# Patient Record
Sex: Male | Born: 1950 | Race: White | Hispanic: No | Smoking: Never smoker
Health system: Southern US, Community
[De-identification: ages and names within clinical notes are randomized; demographics above are authoritative.]

## PROBLEM LIST (undated history)

## (undated) DIAGNOSIS — I10 Essential (primary) hypertension: Secondary | ICD-10-CM

## (undated) DIAGNOSIS — T8579XA Infection and inflammatory reaction due to other internal prosthetic devices, implants and grafts, initial encounter: Secondary | ICD-10-CM

## (undated) DIAGNOSIS — E785 Hyperlipidemia, unspecified: Secondary | ICD-10-CM

## (undated) DIAGNOSIS — Z789 Other specified health status: Secondary | ICD-10-CM

## (undated) DIAGNOSIS — R251 Tremor, unspecified: Secondary | ICD-10-CM

## (undated) DIAGNOSIS — F319 Bipolar disorder, unspecified: Secondary | ICD-10-CM

## (undated) DIAGNOSIS — M1990 Unspecified osteoarthritis, unspecified site: Secondary | ICD-10-CM

## (undated) HISTORY — PX: GROIN MASS OPEN BIOPSY: SHX1714

## (undated) HISTORY — PX: HERNIA REPAIR: SHX51

## (undated) HISTORY — PX: ROTATOR CUFF REPAIR: SHX139

## (undated) HISTORY — PX: JOINT REPLACEMENT: SHX530

## (undated) HISTORY — PX: BACK SURGERY: SHX140

## (undated) HISTORY — PX: HEMORROIDECTOMY: SUR656

---

## 1984-02-03 HISTORY — PX: PILONIDAL CYST EXCISION: SHX744

## 1999-03-31 ENCOUNTER — Encounter: Admission: RE | Admit: 1999-03-31 | Discharge: 1999-03-31 | Payer: Self-pay | Admitting: Orthopedic Surgery

## 1999-03-31 ENCOUNTER — Encounter: Payer: Self-pay | Admitting: Orthopedic Surgery

## 2000-07-02 ENCOUNTER — Encounter: Admission: RE | Admit: 2000-07-02 | Discharge: 2000-07-02 | Payer: Self-pay

## 2000-08-03 ENCOUNTER — Ambulatory Visit (HOSPITAL_COMMUNITY): Admission: RE | Admit: 2000-08-03 | Discharge: 2000-08-03 | Payer: Self-pay | Admitting: *Deleted

## 2000-08-03 ENCOUNTER — Encounter (INDEPENDENT_AMBULATORY_CARE_PROVIDER_SITE_OTHER): Payer: Self-pay

## 2002-12-19 ENCOUNTER — Encounter: Admission: RE | Admit: 2002-12-19 | Discharge: 2002-12-19 | Payer: Self-pay | Admitting: Rheumatology

## 2003-02-09 ENCOUNTER — Encounter: Admission: RE | Admit: 2003-02-09 | Discharge: 2003-02-09 | Payer: Self-pay | Admitting: Rheumatology

## 2003-03-30 ENCOUNTER — Encounter: Admission: RE | Admit: 2003-03-30 | Discharge: 2003-03-30 | Payer: Self-pay | Admitting: Rheumatology

## 2003-05-11 ENCOUNTER — Encounter (INDEPENDENT_AMBULATORY_CARE_PROVIDER_SITE_OTHER): Payer: Self-pay | Admitting: Specialist

## 2003-05-11 ENCOUNTER — Ambulatory Visit (HOSPITAL_COMMUNITY): Admission: RE | Admit: 2003-05-11 | Discharge: 2003-05-11 | Payer: Self-pay | Admitting: *Deleted

## 2004-02-18 ENCOUNTER — Inpatient Hospital Stay (HOSPITAL_COMMUNITY): Admission: RE | Admit: 2004-02-18 | Discharge: 2004-02-21 | Payer: Self-pay | Admitting: Orthopedic Surgery

## 2004-09-21 ENCOUNTER — Encounter: Admission: RE | Admit: 2004-09-21 | Discharge: 2004-09-21 | Payer: Self-pay | Admitting: Orthopedic Surgery

## 2005-01-05 ENCOUNTER — Encounter: Admission: RE | Admit: 2005-01-05 | Discharge: 2005-01-05 | Payer: Self-pay | Admitting: Specialist

## 2006-05-13 ENCOUNTER — Inpatient Hospital Stay (HOSPITAL_COMMUNITY): Admission: RE | Admit: 2006-05-13 | Discharge: 2006-05-17 | Payer: Self-pay | Admitting: Specialist

## 2006-09-25 ENCOUNTER — Encounter: Admission: RE | Admit: 2006-09-25 | Discharge: 2006-09-25 | Payer: Self-pay | Admitting: Orthopedic Surgery

## 2006-10-02 ENCOUNTER — Encounter: Admission: RE | Admit: 2006-10-02 | Discharge: 2006-10-02 | Payer: Self-pay | Admitting: Orthopedic Surgery

## 2007-01-20 ENCOUNTER — Inpatient Hospital Stay (HOSPITAL_COMMUNITY): Admission: AD | Admit: 2007-01-20 | Discharge: 2007-01-21 | Payer: Self-pay | Admitting: Orthopedic Surgery

## 2007-11-09 ENCOUNTER — Ambulatory Visit: Payer: Self-pay | Admitting: Internal Medicine

## 2007-11-28 ENCOUNTER — Encounter: Payer: Self-pay | Admitting: Internal Medicine

## 2007-11-28 ENCOUNTER — Ambulatory Visit: Payer: Self-pay | Admitting: Internal Medicine

## 2007-11-29 ENCOUNTER — Encounter: Payer: Self-pay | Admitting: Internal Medicine

## 2010-03-04 NOTE — Procedures (Signed)
Summary: Colonoscopy   Colonoscopy  Procedure date:  11/28/2007  Findings:      Location:  [facility].    Procedures Next Due Date:    Colonoscopy: 12/2012  Patient Name: Bill Guerrero, Bill Guerrero MRN:  Procedure Procedures: Colonoscopy CPT: 29562.  Personnel: Endoscopist: Bill Guerrero. Bill Goodell, MD.  Exam Location: Exam performed in Outpatient Clinic. Outpatient  Patient Consent: Procedure, Alternatives, Risks and Benefits discussed, consent obtained, from patient. Consent was obtained by the RN.  Indications  Surveillance of: Adenomatous Polyp(s). This is an initial surveillance exam. Initial polypectomy was performed in 2004. in Aug. 1-2 Polyps were found at Index Exam. Largest polyp removed was 6 to 9 mm. Prior polyp located in distal colon. Pathology of worst  polyp: tubular adenoma.  History  Current Medications: Patient is not currently taking Coumadin.  Pre-Exam Physical: Performed Nov 28, 2007. Cardio-pulmonary exam, Rectal exam, HEENT exam , Abdominal exam, Mental status exam WNL.  Comments: Pt. history reviewed/updated, physical exam performed prior to initiation of sedation?yes Exam Exam: Extent of exam reached: Cecum, extent intended: Cecum.  The cecum was identified by appendiceal orifice and IC valve. Patient position: on left side. Time to Cecum: 00:05:37. Time for Withdrawl: 00:17:55. Colon retroflexion performed. Images taken. ASA Classification: II. Tolerance: excellent.  Monitoring: Pulse and BP monitoring, Oximetry used. Supplemental O2 given.  Colon Prep Used Movi prep for colon prep. Prep results: excellent.  Sedation Meds: Patient assessed and found to be appropriate for moderate (conscious) sedation. Fentanyl 100 mcg. given IV. Versed 10 mg. given IV.  Findings POLYP: Ascending Colon, Maximum size: 3 mm. Procedure:  snare without cautery, removed, retrieved, Polyp sent to pathology. ICD9: Colon Polyps: 211.3.  NORMAL EXAM: Cecum to  Rectum.   Assessment  Diagnoses: 211.3: Colon Polyps.   Events  Unplanned Interventions: No intervention was required.  Unplanned Events: There were no complications. Plans Disposition: After procedure patient sent to recovery. After recovery patient sent home.  Scheduling/Referral: Colonoscopy, to Bill Guerrero. Bill Goodell, MD, in 5 years,     cc.   Bill A. Aronson,MD   REPORT OF SURGICAL PATHOLOGY   Case #: [id number] Patient Name: Bill Guerrero, Bill Guerrero. Office Chart Number:  [id number]   MRN: [id number] Pathologist: Bill Guerrero. Bill Bald, MD DOB/Age  22-Jun-1950 (Age: 60)    Gender: M Date Taken:  11/28/2007 Date Received: 11/28/2007   FINAL DIAGNOSIS   ***MICROSCOPIC EXAMINATION AND DIAGNOSIS***   COLON, ASCENDING, POLYP:  - TUBULAR ADENOMA.  - HIGH GRADE DYSPLASIA IS NOT IDENTIFIED.   mj Date Reported:  11/29/2007     Bill Guerrero B. Bill Bald, MD   November 29, 2007 MRN: [id number]    Bill Guerrero [address] [place], [place]  [address]    Dear Bill Guerrero,  I am pleased to inform you that the colon polyp(s) removed during your recent colonoscopy was (were) found to be benign (no cancer detected) upon pathologic examination.  I recommend you have a repeat colonoscopy examination in 5 years to look for recurrent polyps, as having colon polyps increases your risk for having recurrent polyps or even colon cancer in the future.  Should you develop new or worsening symptoms of abdominal pain, bowel habit changes or bleeding from the rectum or bowels, please schedule an evaluation with either your primary care physician or with me.  Additional information/recommendations:  _X._ No further action with gastroenterology is needed at this time. Please      follow-up with your primary care physician for your other healthcare  needs.  _ Please call us if you are having persistent problems or have questions about your condition that have not been fully answered at this  time.  Sincerely,  Bill Fredrickson MD  This report was created from the original endoscopy report, which was reviewed and signed by the above listed endoscopist.

## 2010-03-04 NOTE — Letter (Signed)
Summary: Patient Notice- Polyp Results  [facility]  [address] [place], [place] [address]   Phone: [phone]  Fax: [phone]        November 29, 2007 MRN: [id number]    JAMESROBERT OHANESIAN [address] [place], [place]  [address]    Dear Mr. KAUCHER,  I am pleased to inform you that the colon polyp(s) removed during your recent colonoscopy was (were) found to be benign (no cancer detected) upon pathologic examination.  I recommend you have a repeat colonoscopy examination in 5 years to look for recurrent polyps, as having colon polyps increases your risk for having recurrent polyps or even colon cancer in the future.  Should you develop new or worsening symptoms of abdominal pain, bowel habit changes or bleeding from the rectum or bowels, please schedule an evaluation with either your primary care physician or with me.  Additional information/recommendations:  _X._ No further action with gastroenterology is needed at this time. Please      follow-up with your primary care physician for your other healthcare      needs.  _ Please call us if you are having persistent problems or have questions about your condition that have not been fully answered at this time.  Sincerely,  Hilarie Fredrickson MD  This letter has been electronically signed by your physician.

## 2010-03-04 NOTE — Miscellaneous (Signed)
Summary: previsit/prep  Clinical Lists Changes  Medications: Added new medication of MOVIPREP 100 GM  SOLR (PEG-KCL-NACL-NASULF-NA ASC-C) As per prep instructions. - Signed Rx of MOVIPREP 100 GM  SOLR (PEG-KCL-NACL-NASULF-NA ASC-C) As per prep instructions.;  #1 x 0;  Signed;  Entered by: Alease Frame RN;  Authorized by: Hilarie Fredrickson MD;  Method used: Electronically to [facility] # [phone]*, [address], [place], [place], [place]  [address], Ph: [phone], Fax: [phone] Observations: Added new observation of ALLERGY REV: Done (11/09/2007 8:56) Added new observation of NKA: T (11/09/2007 8:56)    Prescriptions: MOVIPREP 100 GM  SOLR (PEG-KCL-NACL-NASULF-NA ASC-C) As per prep instructions.  #1 x 0   Entered by:   Alease Frame RN   Authorized by:   Hilarie Fredrickson MD   Signed by:   Alease Frame RN on 11/09/2007   Method used:   Electronically to        [facility] # [phone]* (retail)       [address] [place], [place]  [address]       Ph: [phone]       Fax: [phone]   RxID:   [id number]

## 2010-06-17 NOTE — Op Note (Signed)
NAME:  Bill Guerrero, Bill Guerrero            ACCOUNT NO.:  [id number]   MEDICAL RECORD NO.:  [id number]          PATIENT TYPE:  AMB   LOCATION:  SDS                          FACILITY:  [facility]   PHYSICIAN:  Alvy Beal, MD    DATE OF BIRTH:  06-18-1950   DATE OF PROCEDURE:  01/20/2007  DATE OF DISCHARGE:                               OPERATIVE REPORT   PREOPERATIVE DIAGNOSIS:  Left arm radicular arm pain with degenerative  disk disease and soft disk herniation C5-6   POSTOPERATIVE DIAGNOSIS:  Left arm radicular arm pain with degenerative  disk disease and soft disk herniation C5-6   OPERATIVE PROCEDURE:  Anterior cervical diskectomy and fusion C5-6.   COMPLICATIONS:  None.   CONDITION:  Stable.   OPERATIVE INSTRUMENTATION:  A size 8 mm fibular precut allograft  interbody spacer with 14 mm Synthes anterior Vector cervical plate with  16 mm screws affixing it to the bodies of C5-C6.   HISTORY:  This is a pleasant gentleman who presented to my office with  complaints of severe neck and arm pain.  Clinical and radiographic  analysis confirmed the diagnosis of three-level degenerative disk  disease with a C5-6 soft disk.  Clinically, he had all C6 radicular arm  pain, specifically into the biceps and wrist extensor.  Because  clinically he only had one level radicular pain, we elected to proceed  with just doing an ACDF at the effected level.  We discussed the risks,  benefits and alternatives to multilevel versus a single level and disk  replacement versus fusion.  At this point, because of risks, we  discussed options and elected proceed with effusion.   OPERATIVE NOTE:  The patient is brought to the operating room, placed  supine on the operating table.  After successful induction of general  anesthesia and endotracheal intubation, TEDs, SCDs and Foley were  applied.  The shoulders were taped down.  A roll of  towels was placed  between the shoulder blades and the neck was placed into  the initial was  slightly extended position.  The anterior cervical spine was prepped and  draped in a standard fashion and a horizontal incision was made just  below the thyroid cartilage at the level of the C5-6 disk space.  Sharp  dissection was carried out down to and through the platysma.  This was a  standard Smith-Robinson approach to the cervical spine.  I then  dissected into the deep cervical fascia, keeping the trachea and  esophagus medial and the carotid sheath lateral.  I was able to bluntly  dissect through the remainder of the deep cervical and prevertebral  fascia to palpate the anterior cervical spine.  I swept the trachea and  esophagus medially, protected it with a thyroid retractor and then  identified the C5-6 disk space.  There was significant anterior  osteophyte but I  was able to get a needle underneath this and took a  lateral x-ray to confirm that I was 5-6 disk space.  Once confirmed, I  then mobilized the longus coli muscles bilaterally at the level  uncovertebral joints to  expose the mid body of C5 to the mid body of C6.  I then used a Leksell rongeur to resect the overhanging traction  anterior spur at the 5-6 disk space level.  This exposed the disk space  and the disk material.  I sharply incised the disk material was 15 blade  scalpel and then resected the disk material with pituitary rongeurs,  Kerrison rongeurs and curettes.  With most of the disk material out, I  then placed Caspar retracting blades into the wound, deflated the  endotracheal cuff, re-expanded them to the appropriate length in order  to provide medial lateral retraction.  I then placed distraction pins  into the body C5-C6 and gently distracted the interbody space.  I then  continued by worked posteriorly with fine curettes.  I developed a plane  underneath the posterior longitudinal ligament and then used a 1 mm  Kerrison to resect the posterior longitudinal ligament.  I did note on   the left-hand side that there was a soft disk fragment which I was able  to remove with a combination of a micro pituitary rongeur and the 1 mm  Kerrison.  At this point, with the disk fragment out and posterior  longitudinal resected, I then palpated with a micro nerve hook to ensure  that I had an adequate decompression.  Once confirmed, I then used a  rasper to get bleeding subchondral bone and then measured the interbody  space.  I then an 8 mm lordotic precut fibular graft, rehydrated, packed  the center with DBX and then malleted to the appropriate position.  I  confirmed this with lateral fluoroscopy.  I then removed the distraction  pins and placed a 12 mm anterior cervical plate and affixed it with 16  mm screws to the body of C6 and C5.  I confirmed satisfactory position  of the hardware in both the AP and lateral planes and then made sure  that all the screws were locked to the plate.  With solid fixation  complete, I then removed the Caspar retracting blades and checked to  ensure that the esophagus was not entrapped on the medial side of the  plate.  Once confirmed, I returned the trachea and esophagus to midline,  irrigated copiously with normal saline, closed the platysma with  interrupted 2-0 Vicryl sutures and the skin with 3-0 Monocryl.  Steri-  Strips, dry dressing and a soft collar were applied.  The patient was  extubated and transferred to the PACU without incident.      Alvy Beal, MD  Electronically Signed     DDB/MEDQ  D:  01/20/2007  T:  01/20/2007  Job:  [id number]

## 2010-06-20 NOTE — Discharge Summary (Signed)
Bill Guerrero, MARKWOOD             ACCOUNT NO.:  [id number]   MEDICAL RECORD NO.:  [id number]          PATIENT TYPE:  INP   LOCATION:  0464                         FACILITY:  [facility]   PHYSICIAN:  Ollen Gross, M.D.    DATE OF BIRTH:  01/04/1951   DATE OF ADMISSION:  02/18/2004  DATE OF DISCHARGE:  02/21/2004                                 DISCHARGE SUMMARY   ADMISSION DIAGNOSES:  1.  Osteoarthritis, left hip.  2.  Anxiety.  3.  Hypercholesterolemia.  4.  Hypertension.  5.  Chronic constipation.  6.  Hemorrhoids.   DISCHARGE DIAGNOSES:  1.  Osteoarthritis, left hip, status post total hip arthroplasty.  2.  Anxiety.  3.  Hypercholesterolemia.  4.  Hypertension.  5.  Chronic constipation.  6.  Hemorrhoids.  7.  Postoperative hypokalemia, improved.  8.  Postoperative hyponatremia, improved.   PROCEDURE:  On February 18, 2004:  Left total hip arthroplasty.   SURGEON:  Ollen Gross, M.D.   ASSISTANT:  Alexzandrew L. Perkins, P.A.-C.   ANESTHESIA:  General.   BLOOD LOSS:  500 cc.   DRAINS:  Hemovac x1.   CONSULTS:  None.   BRIEF HISTORY:  He is a 60 year old male who has had rapidly progressive  arthritis of the left hip.  The pain has been intractable.  Dysfunction is  worsening.  Now presents for a total hip arthroplasty.   LABORATORY DATA:  Preop CBC:  Hemoglobin 13.2, hematocrit 38.4, white cell  count 6.5.  Differential within normal limits.  Postop hemoglobin 10.5.  Last noted H&H 10 and 29.  PT/PTT preop 12.8 and 26, respectively.  Serial  pro times followed.  The last noted PT/INR 20.5 and 2.2.  Chem panel on  admission all within normal limits.  Serial BMETs are followed.  A drop in  potassium from 4.5 to 3.4, back up to 4.1.  Drop in sodium from 141 down to  134, back up to 136.  Urinalysis preop negative.  Follow-up UA:  Trace  hemoglobin, moderate leukocyte esterase with only 3-6 white cells, 0-2 red  cells, rare bacteria.  Blood group type A+.  The urine  culture, which was  not available at the time of discharge, did show some staph species.  Questionable contaminate.   Left hip films on February 14, 2004:  Bilateral hip arthritis, left worse  than right.  Portal hip and left hip films on February 18, 2004:  Left total  prosthesis.  No complicating features.   HOSPITAL COURSE:  Admitted admitted to [facility] , taken to the  OR, and underwent the above procedure without difficulty.  Transferred to  the recovery room and then to the orthopedic floor.  Placed on PCA and p.o.  analgesics for pain control.  Did well postop.  Had a good night.  After  surgery, he was doing well.  On the morning rounds, Hemovac drain was pulled  without difficulty.  He had a little bit of drop in his potassium.  K-Dur  was added.  A little bit of drop in his sodium.  Fluids were KVO'd.  By  the  following day, the sodium and potassium was back up.   On day #2, a little bit of temp.  UA was checked and only showed trace blood  and 3-6 white cells.  Incision was healing well.   On the dressing change by day #3, had a little bit of soreness, but he had  already been up walking very well, 200-400 feet with therapy.  Doing well  with his therapy goals and was ready to be discharged home.   DISCHARGE PLAN:  1.  Patient was discharged home on February 21, 2004.  2.  Discharge diagnoses:  Please see above.  3.  Discharge meds:  Percocet, Robaxin, Coumadin.  4.  Diet as tolerated.  5.  Activity:  Hip precautions.  May start showering.  Home health PT/OT and      home health nursing.  Total hip protocol.  Partial weightbearing 25-50%,      left lower extremity.  6.  Follow up in two weeks from surgery.   DISPOSITION:  Home.   CONDITION ON DISCHARGE:  Improved.      ALP/MEDQ  D:  03/26/2004  T:  03/27/2004  Job:  [id number]   cc:   Geoffry Paradise, M.D.  [address]  [place]  [place] [address]  Fax: [phone]

## 2010-06-20 NOTE — Discharge Summary (Signed)
NAMEMarland Kitchen  Bill Guerrero, Bill Guerrero            ACCOUNT NO.:  [id number]   MEDICAL RECORD NO.:  [id number]          PATIENT TYPE:  INP   LOCATION:  5025                         FACILITY:  [facility]   PHYSICIAN:  Jene Every, M.D.    DATE OF BIRTH:  14-Mar-1950   DATE OF ADMISSION:  05/13/2006  DATE OF DISCHARGE:  05/17/2006                               DISCHARGE SUMMARY   ADMISSION DIAGNOSES:  1. 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  2. Degenerative spondylolisthesis.  3. History of significant depression.  4. Hypercholesterolemia.  5. Hypertension.  6. Seasonal allergies.   DISCHARGE DIAGNOSES:  1. 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  2. Degenerative spondylolisthesis.  3. History of significant depression.  4. Hypercholesterolemia.  5. Hypertension.  6. Seasonal allergies.  7. Status post lumbar decompression and fusion at L4-5.  8. Resolved hypokalemia.  9. Resolved sinus tachycardia.  10.Stable postoperative anemia.  11.Resolved constipation.   CONSULTATIONS:  PT/OT, case management, Dr. Jacky Kindle.   PROCEDURE:  The patient was sent to the OR on May 13, 2006 to undergo  lumbar decompression of L4-5, trans lower interbody fusion, pedicle  screw instrumentation.   SURGEON:  Dr. Jene Every.   ASSISTANT:  Vira Browns.   COMPLICATIONS:  The patient did have a small dural which was repaired  intraoperatively.   HISTORY:  Bill Guerrero is a pleasant 60 year old gentleman with a long-  standing history of both lower extremity pain.  He initially sought  treatment by Dr. Lequita Halt, at that time it was felt complaints were  coming from significant osteoarthritis of the hip.  He underwent a total  hip arthro placement with minimal relief of his symptoms.  Following  that, Dr. Lequita Halt ordered a MRI scan which showed deficit changes with  synovial cysts.  He did follow with Dr. Ethelene Hal for quite some time, and  underwent multiple injections with minimal relief of symptoms.  He  finally had surgical  evaluation by Dr. Shelle Iron.  Dr. Shelle Iron at that time  obtained a myelogram which did show severe stenosis at L4-5 with a  degenerative spondylolisthesis.  The patient after seeing Dr. Shelle Iron went  to Dr. Otelia Sergeant for a second opinion, who in conjunction with Dr. Shelle Iron did  recommend a lumbar decompression followed by fusion.  The risks and  benefits of this were discussed with the patient, medical clearance was  obtained, and he did elect to proceed.   LABORATORY DATA:  Preoperative CBC showed white cell count at 8.8,  hemoglobin 13.4 hematocrit 38.  This was followed throughout the  hospital course.  The patient's white cell count did spike to a level of  14.8 postoperatively, but at time of discharge resolved to a low of 8.2.  Hemoglobin at time of discharge was 8.4, hematocrit 23.6, however, the  patient was asymptomatic.  Declined any transfusions and was placed on  iron supplementation.  Coagulation studies done preoperatively showed PT  of 13.5, INR 1, PTT 29.  Routine chemistries done preoperatively showed  sodium 140, potassium 3.8, normal glucose as well as BUN and creatinine.  These were followed throughout the hospital course.  The patient did  have a slight drop in his sodium to a low of 133.  However, at time of  discharge, drawn normal at 135.  Glucose fluctuated throughout the  hospital stay.  BUN and creatinine remained within normal range.  Routine liver function tests showed a decrease of TP at 5.2, ABL at 2.7,  normal AST and ALT.  LP at 35.  Anemia studies show low iron at 16.  Lithium level was low at 0.25.  Preoperative urinalysis was negative.  Postoperative urinalysis showed small amounts of hemoglobin with 0 to 2  WBCs per high powered field, 7 to 10 RBCs seen per high powered field.  Urine culture was negative.  Blood type A+.  Preoperative EKG showed  normal sinus rhythm, nonspecific ST-T wave changes when compared to  previous EKG.  There was noted resolved first  degree AV block.  I do not  see a preoperative chest x-ray report in the chart.   HOSPITAL COURSE:  The patient was taken to the OR and underwent the  above stated procedure.  Intraoperatively 1 HemoVac drain was placed.  The patient was transferred to the PACU and then to the orthopedic floor  for continued postoperative care.  He was placed on PCA analgesics for  pain relief.  He was kept on bedrest secondary to intraoperative dural  leak which was repaired.  Postoperative day #1, the patient was doing  fairly well.  Vital signs were stable.  He was afebrile.  He denied any  headache or shortness of breath.  He did have positive bowel sounds.  His diet was advanced to clear liquids.  Plan to discomfort HemoVac  later in the day.  Following that postoperative day #1, the patient did  develop some episodes of sinus tachycardia which heart rate would  increase to the 130s then drop back to 100.  He again continued denying  chest pain or any shortness of breath.  At that time, HemoVac tip was  removed.  We did consult Dr. Jacky Kindle at that point for further workup.  Dr. Jacky Kindle did follow up with the patient and did an extensive workup  to rule out any cardiac issues.  He felt the tachycardia was secondary  to pain.  Therefore placed him on scheduled Percocet.  EKG was followed  which showed no significant change.  Leucine levels monitored.  Did  place him on stool softeners as well as a laxative of choice for his  constipation.  Postoperative day #2, the patient did spike a temperature  T-max of 102.5, but he again continued to deny any chest pain or  shortness of breath.  Lungs were clear.  Incentive spirometer was  encouraged at that point.  Dr. Jacky Kindle did follow with urine and urine  culture which came back negative.  At this point, the palpitations have  resolved.  Postoperative day #3, the patient continued to deny nausea,  no chest pain, no shortness of breath.  He continued to have  a  persistent dry cough.  He was passing flatus, but no bowel movement.  Urine cultures were negative.  The patient did have a slight drop in his  hemoglobin to a low of 8.7, hematocrit 24.6.  He was started on iron  supplementation at that time, as well as more aggressive therapy for his  constipation.  Postoperative day #4, the patient was doing much better.  He had a bowel movement.  He denied any chest pain, shortness of breath,  or dizziness.  Hemoglobin  was at 8.7, but again the patient was  asymptomatic from this.  He was up ambulating without significant  difficult.  Palpitations had resolved.  It was felt at this point that  the patient was stable to be discharged home.   DISPOSITION:  The patient discharged home without any home PT/OT as  declined by the patient.   FOLLOWUP:  He should follow up with Dr. Shelle Iron in approximately 10 to 14  days for suture removal and x-rays.  He is to follow up with Dr. Jacky Kindle  as necessary.   WOUND CARE:  Keep the incision clean, dry, and intact.  Change the  dressing daily.   ACTIVITY:  Ambulate as tolerated.  Use assistance of walker and his  brace.  Use proper back precautions.   DISCHARGE MEDICATIONS:  Norco, Trinsicon, Robaxin, vitamin C.  The  patient will continue using incentive spirometer at home.   DIET:  High fiber.   CONDITION ON DISCHARGE:  Stable.   FINAL DIAGNOSIS:  Doing well status post lumbar decompression and fusion  using pedicle screw instrumentation at L4-5.      Roma Schanz, P.A.      Jene Every, M.D.  Electronically Signed    CS/MEDQ  D:  08/30/2006  T:  08/30/2006  Job:  [id number]

## 2010-06-20 NOTE — Op Note (Signed)
NAME:  Bill Guerrero, Bill Guerrero                       ACCOUNT NO.:  [id number]   MEDICAL RECORD NO.:  [id number]                   PATIENT TYPE:  AMB   LOCATION:  DAY                                  FACILITY:  [facility]   PHYSICIAN:  Vikki Ports, M.D.         DATE OF BIRTH:  July 18, 1950   DATE OF PROCEDURE:  05/11/2003  DATE OF DISCHARGE:                                 OPERATIVE REPORT   PREOPERATIVE DIAGNOSES:  Grade 2 internal hemorrhoids.   POSTOPERATIVE DIAGNOSES:  Grade 2 internal hemorrhoids.   PROCEDURE:  PPH anopexy, complex hemorrhoidectomy.   SURGEON:  Vikki Ports, M.D.   ANESTHESIA:  General.   DESCRIPTION OF PROCEDURE:  The patient was taken to the operating room,  placed in a supine position and after adequate general anesthesia was  induced using endotracheal tube, the patient was placed in the prone  jackknife position.  Perianal and rectal prep were undertaken. Anal  dilatation was accomplished using up to three fingers with adequate  dilatation.  The three hemorrhoidal bundles were injected using Marcaine  with Wydase and then the internal and external sphincter muscles were  injected using 40 mL of 0.25 Marcaine with epinephrine.  A 2-0 Prolene  suture in the pursestring fashion was placed 5 cm proximal to the dentate  line in a circumferential fashion. The stapler was then introduced above the  pursestring suture and the pursestring suture was tied down. The stapler was  closed and held in place for 30 seconds. It was fired, held in place for an  additional 20 seconds and then opened and removed.  Hemorrhoidal ring was  inspected, there was no evidence of muscular tissue just mucosa and  submucosa with some scarring from previous sclerosis therapy. The staple  line was then tediously inspected and found to be completely hemostatic.  Gelfoam packing was placed.  The patient tolerated the procedure well and  went to PACU in good condition.                                         Vikki Ports, M.D.    KRH/MEDQ  D:  05/11/2003  T:  05/11/2003  Job:  [id number]

## 2010-06-20 NOTE — H&P (Signed)
NAME:  Bill Guerrero, Bill Guerrero             ACCOUNT NO.:  [id number]   MEDICAL RECORD NO.:  [id number]          PATIENT TYPE:  INP   LOCATION:  NA                           FACILITY:  [facility]   PHYSICIAN:  Ollen Gross, M.D.    DATE OF BIRTH:  [birth year]   DATE OF ADMISSION:  02/18/2004  DATE OF DISCHARGE:                                HISTORY & PHYSICAL   DATE OF OFFICE VISIT HISTORY AND PHYSICAL:  February 12, 2004   CHIEF COMPLAINT:  Left hip pain.   HISTORY OF PRESENT ILLNESS:  The patient is a 60 year old, who has been seen  by Dr. Lequita Halt for ongoing severe left hip pain.  He has had progressive  worsening pain over time and feels like he needs to have something done  about it.  Experienced a lot of thigh and groin pain.  He is seen in the  office where x-rays show bone-on-bone changes in the left hip, felt he would  be a candidate.  Risks and benefits discussed, patient subsequently admitted  to the hospital.  The patient has been seen preoperatively by Dr. Jacky Kindle,  felt medically stable for up and coming hip surgery.   ALLERGIES:  No known drug allergies.   CURRENT MEDICATIONS:  1.  Lipitor 80 mg daily.  2.  Tri-Chlor 160 mg daily.  3.  Verapamil 240 mg daily.  4.  Lithobid 300 mg 1 p.o. b.i.d.  5.  Trazodone 300 mg p.o. q.p.m.  6.  Trileptal 150 mg p.o. b.i.d.  7.  Celebrex 200 mg p.o. b.i.d.  8.  Ambien 10 mg p.o. q.h.s.  9.  Lortab 10/500 up to 4 daily.  10. Enbrel injections 50 mg weekly.  11. Seroquel 200 mg daily.   PAST MEDICAL HISTORY:  1.  Anxiety.  2.  Hypercholesterolemia.  3.  Hypertension.  4.  Chronic constipation.  5.  Hemorrhoids.  6.  Osteoarthritis.   PAST SURGICAL HISTORY:  1.  Pilonidal cyst removal in 1986.  2.  Hernia repair in 1986.  3.  Abdominal exploratory laparotomy in 2002.  4.  Right shoulder rotator cuff surgery in 2002.  5.  Hemorrhoid surgery in March 2005.   SOCIAL HISTORY:  Married, retired, nonsmoker, one drink of alcohol per  month, has three children.  Wife will be assisting with care after surgery.   FAMILY HISTORY:  Mother deceased, age 52, with a history of heart disease,  cancer, arthritis.  Grandmother with history of stroke and diabetes and  cancer.  Grandfather with history of stroke.   REVIEW OF SYSTEMS:  GENERAL:  No fever, chills, or night sweats.  NEUROLOGIC:  No seizure, syncope, paralysis.  RESPIRATORY:  No shortness of  breath, productive cough, or hemoptysis.  CARDIOVASCULAR:  No chest pain,  angina, or orthopnea.  GI:  No nausea, vomiting, diarrhea. Does have chronic  constipation likely due to medications.  GU:  No dysuria, hematuria, or  discharge.  MUSCULOSKELETAL:  Left hip found in the history of present  illness.   PHYSICAL EXAMINATION:  VITAL SIGNS:  Pulse 72, respirations 14, blood  pressure 130/68.  GENERAL:  A 60 year old white male, well-nourished, well-developed, in no  acute distress.  Medium to large frame, alert, oriented, and cooperative.  Very pleasant.  HEENT:  Normocephalic, atraumatic.  Pupils are round and reactive.  Oropharynx is clear.  EOMs are intact.  NECK:  Supple.  No carotid bruits.  CHEST:  Clear anterior and posterior chest walls.  No rhonchi, rales, or  wheezing.  HEART:  Regular rhythm.  No murmurs.  S1 and S2.  ABDOMEN:  Soft, nontender.  Bowel sounds present.  RECTAL/BREASTS/GENITALIA:  Not done.  Not pertinent to present illness.  EXTREMITIES:  Significant to the left hip.  The left shows flexion of 95,  internal rotation of 5, external rotation 15, abduction of 15-20, antalgic  gait ambulation.   IMPRESSION:  1.  Osteoarthritis, left hip.  2.  Anxiety.  3.  Hypercholesterolemia.  4.  Hypertension.  5.  Chronic constipation.  6.  Hemorrhoids.   PLAN:  The patient will be admitted to [facility] to undergo a  left total hip arthroplasty.  Surgery will be performed by Dr. Ollen Gross.  Dr. Jacky Kindle is the patient's medical doctor.  He  will be notified  of the room number on admission and be consulted to assist with medical  management of the patient throughout the hospital course.     Alex  ALP/MEDQ  D:  02/17/2004  T:  02/17/2004  Job:  [id number]   cc:   Geoffry Paradise, M.D.  [address]  [place]  [place] [address]  Fax: [phone]

## 2010-06-20 NOTE — Op Note (Signed)
[facility]  Patient:    Bill Guerrero, Bill Guerrero                   MRN: [id number] Proc. Date: 08/03/00 Adm. Date:  [id number] Attending:  Vikki Ports.                           Operative Report  PREOPERATIVE DIAGNOSIS:  Right posterior shoulder mass and left groin mass.  POSTOPERATIVE DIAGNOSIS:  Right posterior shoulder mass and left groin mass.  PROCEDURE PERFORMED: 1. Excision of right shoulder lipoma. 2. Excisional biopsy left groin mass.  ANESTHESIA:  General.  SURGEON:  Vikki Ports, M.D.  DESCRIPTION OF PROCEDURE:  The patient was taken to the operating room and placed in the supine position.  After adequate anesthesia was induced using an endotracheal tube, the patient was placed in the left lateral decubitus position.  The right posterior shoulder was prepped and draped in normal sterile fashion.  Using a longitudinal incision overlying the mass and dissected down onto a fatty well encapsulated mass.  This was delivered up and through the wound.  Adequate hemostasis was ensured and the skin was closed with subcuticular 4-0 Monocryl.  The patient was then placed in the supine position and the left groin was prepped and draped in a normal sterile fashion.  I made a small oblique incision in the inguinal fold over the palpable mass, I dissected down into what appeared to be an enlarged firm lymph node.  This was excised after it was dissected free from surrounding structures and sent for pathologic evaluation.  No other masses were palpated.  The skin was closed with subcuticular 4-0 Monocryl.  Steri-Strips and sterile dressing was applied. The patient tolerated the procedure well and went to the post anesthesia care unit in good condition. DD:  08/03/00 TD:  08/03/00 Job: [id number]

## 2010-06-20 NOTE — Op Note (Signed)
NAMEMarland Guerrero  JARIOUS, LYON             ACCOUNT NO.:  [id number]   MEDICAL RECORD NO.:  [id number]          PATIENT TYPE:  INP   LOCATION:  Z610                         FACILITY:  [facility]   PHYSICIAN:  Ollen Gross, M.D.    DATE OF BIRTH:  1950/03/30   DATE OF PROCEDURE:  02/18/2004  DATE OF DISCHARGE:                                 OPERATIVE REPORT   PREOPERATIVE DIAGNOSES:  Osteoarthritis left hip.   POSTOPERATIVE DIAGNOSES:  Osteoarthritis left hip.   PROCEDURE:  Left total hip arthroplasty.   SURGEON:  Ollen Gross, M.D.   ASSISTANT:  Alexzandrew L. Julien Girt, P.A.   ANESTHESIA:  General.   ESTIMATED BLOOD LOSS:  500   DRAINS:  Hemovac x1.   COMPLICATIONS:  None.   CONDITION:  Stable to recovery.   BRIEF CLINICAL NOTE:  Bill Guerrero is a 60 year old male who has had rapidly  progressive arthritis of his left hip. He has had intractable pain and  dysfunction and presents now for total hip arthroplasty.   DESCRIPTION OF PROCEDURE:  After successful administration of general  anesthetic, the patient is placed in the right lateral decubitus position  with his left side up and held with a hip positioner. The left lower  extremity was isolated from his perineum with plastic drapes and prepped and  draped in the usual sterile fashion.  A standard posterolateral incision is  made with the 10 blade through the subcutaneous tissue through to the level  of the fascia lata which is incised in line with the skin incision. The  sciatic nerve is palpated and protected and the short external rotators  isolated off the femur.  The capsulectomy is then performed and the hip  dislocated. The center of the femoral head is marked and trial prosthesis  placed such that the center of the trial head corresponds to the center of  the native femoral head.  The osteotomy line is marked on the femoral neck  and osteotomy made with an oscillating saw.  The femur is then retracted  anteriorly to gain  acetabular exposure.   The acetabular labrum and osteophytes are removed. Reaming is performed  starting at 47 and coursing in increments of 2 mm up to 59.  A 60 mm  pinnacle acetabular shell is placed in anatomic position and transfixed with  two dome screws. A trial 36 mm neutral liner is placed.   The femur is prepared first with a canal finder and then irrigation. Axial  reaming is performed up to 13.5 mm and proximally reamed to a 48F and the  sleeve machined to a large. An 45F large trial sleeve is placed with an 18 x  13 stem with 36 plus 12 neck.  We matched his native anteversion with the  neck. The 36 plus 0 trial head is placed and the hip is reduced.  He has had  excellent stability with full extension, full external rotation, 70 degrees  flexion, 40 degrees adduction, 90 degrees internal rotation and 90 degrees  of flexion, 70 degrees internal rotation. I was able to flex him all the way  up to  about 130 degrees without dislocating.  By placing the left leg on top  of the right, the leg lengths are equal. The hip is then dislocated and all  trials removed.  The permanent apex hole eliminator is placed into the  acetabular shell and a permanent 36 mm neutral Ultramet metal liner is  placed. This is a metal on metal hip replacement. The 54F large sleeve is  placed into the proximal femur with the 18 x 13 stem and a 36 plus 12 neck.  We matched his native anteversions once again.  I then placed a 26 plus 0  head, reduced the hip with the same stability parameters. The wound was  copiously irrigated with saline solution and short rotators reattached to  the femur through drill holes. The fascia lata was closed over a Hemovac  drain with interrupted #1 Vicryl, subcu closed with #1 and 2-0 Vicryl and  subcuticular with running 4-0 Monocryl. 20 mL of 0.25% Marcaine are then  injected into the subcutaneous tissues.  Steri-Strips and a bulky sterile  dressing are applied, drains  hooked to suction.  He was easily awakened,  placed into a knee immobilizer and transported to recovery in stable  condition.     Drenda Freeze   FA/MEDQ  D:  02/18/2004  T:  02/18/2004  Job:  [id number]

## 2010-06-20 NOTE — H&P (Signed)
NAMEMarland Kitchen  Bill Guerrero, Bill Guerrero            ACCOUNT NO.:  [id number]   MEDICAL RECORD NO.:  [id number]          PATIENT TYPE:  INP   LOCATION:  NA                           FACILITY:  [facility]   PHYSICIAN:  Jene Every, M.D.    DATE OF BIRTH:  06/15/1950   DATE OF ADMISSION:  05/13/2006  DATE OF DISCHARGE:                              HISTORY & PHYSICAL   CHIEF COMPLAINT:  Left lower extremity pain.   HISTORY:  Bill Guerrero is a pleasant 60 year old gentleman who has had  a longstanding history of left lower extremity pain.  He initially  sought out treatment from Dr. Lequita Halt for complaints of osteoarthritis  of the left hip, he had this replaced and unfortunately continued to  have some symptoms.  Dr. Lequita Halt then ordered an MRI scan which showed  facet changes as well as synovial cyst.  He saw Dr. Ethelene Hal for multiple  injections which did not give him much relief.  He then finally followed  up with Dr. Shelle Iron for surgical evaluation.  At that point, we obtained a  myelogram which did reveal severe stenosis at 4-5 with a degenerative  slip.  At the point of evaluation, the patient's symptoms were fairly  well tolerated, he was fairly active, but unfortunately over the course  of several months has progressed to be very disabling for him.  Dr.  Shelle Iron did send the patient for a second opinion with Dr. Otelia Sergeant who  recommended decompression followed by fusion.  Dr. Shelle Iron then  reevaluated the patient and in talking with him about his symptoms and  his radiologic findings it was felt that he may be able to benefit from  a decompression and fusion.  The risks and benefits of this were  discussed with the patient as well as the possible complications versus  pain.  Patient did obtain medical clearance and elected to proceed with  the surgery.   MEDICAL HISTORY:  Significant for:  1. Longstanding history of depression.  2. Hypercholesterolemia.  3. Hypertension.  4. SEASONAL ALLERGIES.   CURRENT  MEDICATIONS:  Include:  1. Trazodone 300 mg one p.o. q.h.s.  2. Seroquel 200 mg one p.o. q.h.s.  3. Lipitor 80 mg one p.o. q.a.m.  4. Tricor 145 mg one p.o. q.a.m.  5. Verapamil 240 mg one p.o. q.a.m.  6. Celebrex 200 mg one p.o. b.i.d.  7. Lithium carbonate 300 mg one p.o. b.i.d.  8. Hydrocodone 10/650 p.r.n. pain.  9. Cialis 20 mg one p.o. p.r.n.   ALLERGIES:  None.   PREVIOUS SURGERIES:  Includes:  1. Left total hip arthroplasty by Dr. Lequita Halt in 2006.  2. Right rotator cuff repair in 2006 by Dr. Lequita Halt.  3. Hernia repair in 1986.  4. Removal of pilonidal cyst in 1986.  5. Hemorrhoidectomy in 2005.  6. Laparoscopy in 2002.   SOCIAL HISTORY:  The patient is married.  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  He denies any alcohol or  tobacco consumption.  He has three children.  His wife will be his  caregiver following surgery.  His home  has two floors with 16 stairs  between each level.   FAMILY HISTORY:  Significant for mother had a history of heart disease  as well as chronic lymphocytic leukemia, she also had a history of  osteoarthritis, a paternal grandmother with history of diabetes as well  as a CVA, maternal grandmother had cancer.   REVIEW OF SYSTEMS:  GENERAL:  The patient denies any fevers, chills,  night sweats or bleeding tendencies.  CNS:  No blurred or double vision,  seizure, headache or paralysis.  RESPIRATORY:  No shortness of breath,  productive cough or hemoptysis.  CARDIOVASCULAR:  No chest pain,  dyspnea, orthopnea.  GU:  No dysuria, hematuria or discharge.  GI:  No  nausea, vomiting, diarrhea, constipation, melena or bloody stools.  MUSCULOSKELETAL:  Is pertinent in the HPI.   PHYSICAL EXAMINATION:  VITALS:  Pulse is 60, respiratory 16, BP 122/80.  GENERAL:  This is a well-developed well-nourished gentleman sitting  upright in no acute distress.  He does walk with an antalgic gait  utilizing a cane.  HEENT:   Atraumatic, normocephalic.  Pupils equal, round and reactive to  light,  EOMs intact.  NECK:  Supple.  No lymphadenopathy.  CHEST:  Clear to auscultation bilaterally.  No rhonchi, wheezes or  rales.  BREAST/GU EXAM:  Not pertinent to HPI.  HEART:  Regular rate and rhythm, without murmurs, gallops or rubs.  ABDOMEN:  Soft, nontender, nondistended.  Bowel sounds x4.  SKIN:  No rashes or lesions noted.  EXTREMITIES:  In regard to the lumbar spine, he does have pain with  forward flexion and extension of the lumbar spine.  He does have  positive straight leg raise.  EHL is 5-/5.  DPs are equal bilaterally.   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   PLAN:  The patient will be admitted to [facility] to undergo a  lumbar decompression 4-5 with a TLIF, posterolateral  fusion using  pedicle screw instrumentation, autologous and autograft bone.  We will  need to call Dr. Jacky Kindle who is his primary care physician just to  advise him of room number postoperatively.      Roma Schanz, P.A.      Jene Every, M.D.  Electronically Signed    CS/MEDQ  D:  05/10/2006  T:  05/10/2006  Job:  [id number]

## 2010-06-20 NOTE — Op Note (Signed)
NAMESPYROS, WINCH            ACCOUNT NO.:  [id number]   MEDICAL RECORD NO.:  [id number]          PATIENT TYPE:  INP   LOCATION:  2550                         FACILITY:  [facility]   PHYSICIAN:  Jene Every, M.D.    DATE OF BIRTH:  Jul 22, 1950   DATE OF PROCEDURE:  05/13/2006  DATE OF DISCHARGE:                               OPERATIVE REPORT   PREOPERATIVE DIAGNOSES:  1. Spinal stenosis.  2. Spondylolisthesis L4-5.   POSTOPERATIVE DIAGNOSES:  1. Spinal stenosis.  2. Spondylolisthesis L4-5.  3. 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   PROCEDURE PERFORMED:  1. Central decompression at L4-5.  2. Transforaminal lumbar interbody fusion L4-5 utilizing an Abbott      interbody spacer with autologous and VITOSS bone graft.  3. Pedicle screw instrumentation at L4-5.  4. Posterolateral fusion L4-5 utilizing autologous and Vitoss bone      graft.  5. Continuous intraoperative free running EMGs, neural monitoring for      5 hours.  6. Repair of dural rent.   BRIEF HISTORY AND INDICATIONS:  This is a 60 year old with  spondylolisthesis L4-5, severe spinal stenosis and arthropathy  refractory to conservative treatment, multiple exacerbations.  EHL  weakness was noted bilaterally.  It is indicated for decompression at  the L4-5 and instrumentation at 4-5, TLIF fusion to restore the height  of this foramen, unload the L4 and the L5 nerve roots and stabilize the  L4-5 space.  Risks and benefits discussed to include bleeding,  infection, damage to vascular structures, CSF leakage, epidural  fibrosis, adjacent segment disease, need for fusion in the future,  anesthetic complication, et Karie Soda.   TECHNIQUE:  The patient placed in the supine position.  After placement  of adequate general anesthesia and 2 grams Kefzol, he was placed prone  on the Wilson frame.  All bony prominences were well padded.  He was  attached to leads for neural monitoring, free running EMGs and SSEPs.  The lumbar area is prepped and  draped in the usual sterile fashion.  A  midline incision was made from the spinous process of 3 to the bottom of  5.  Subcutaneous tissue was dissected, electrocautery obtained  hemostasis.  Dorsal lumbar fascia was identified, divided along the skin  incision.  Paraspinous muscle elevated from lamina 4, 5 and at 3.  The  McCullough retractor was placed.  Kochers were used to confirm the space  at 4-5.  Central decompression was born at 4-5.  We, prior to that,  skeletonized the spinous processes at 4 and 5 and then morcellized them  and saved them for bone graft.  We then completed the wide decompression  at the medial border of the pedicle bilaterally, cephalad and caudad,  performing foraminotomies at 4 and 5 with a 3-mm Kerrison.  Significant  severe stenosis was noted in the lateral recess as well as in the  foramen.  Beneath the spicular bone noted on the MRI, there was an  extreme attenuation of approximately 1 cm in length.  It was felt that  this was nearly full thickness.  There was no CSF leakage thought but  a  bleb.  It was felt to be secondary to the spicula.  It was then repaired  with 4-0 Nurolon interrupted sutures for a closure.  Valsalva was  performed following that and there was no CSF leakage.   Next, we turned our attention towards the left side, mobilized the  thecal sac, protected the nerve root, performed an annulotomy in the 4-5  disk and a copious portion of disk material was removed with a straight  and upbiting pituitary with multiple passes, utilizing a curet to curet  the end plates of 4 and 5.  Full diskectomy was performed and the disk  space was irrigated.  We sequentially dilated the disk space with a 10  mm dilator seen on x-ray to be satisfactory and it felt like an  interdigitating fit.  I then selected a PEEK cage of 10 mm in width and  packed it with autologous bone and then inserted it as best possible  diagonally across the disk space.  Prior to  that, we packed the anterior  part of the disk space with cancellous bone with a fair amount of  cancellous bone from the facet and the decompression and felt the spacer  helped to restore the height of the foramen at 4 and the hockey stick  probe placed out there freely.  There was intermittent activity noted on  the tibialis anterior on the left but nothing consistent.  We also used  partial medial hemifacetectomies and removed a fair portion of fairly  large facets for bone graft.   Next, we turned our attention toward the pedicle screw instrumentation  in the junction between the transverse process and the lateral border of  the facet.  In the mid portion of the AP and lateral plane an awl was  used to perform a starting hole at 4 bilaterally and at 5, however, had  to remove bone from the outer surface of the facet due to the facet had  overgrown and was obscuring the starting point for the pedicle screw.  He has a fair amount of soft tissue that was difficult to navigate  through as well.  We used a self-retaining retractor.  In the  appropriate convergence and under C-arm x-ray at every step, we advanced  into the pedicles at 4 and 5 without difficulty.  They were tapped.  One  pedicle were aspirated 10 mL of bone marrow, mixed it with our Vitoss  and saved that for bone graft.  We then used a high speed bur to  decorticate the transverse processes to remove some of the paraspinous  muscles to provide for a receiving bed for the bone graft.  We advanced  50 mm screws at 4 and 45 mm screws at 5 with excellent purchase in all.  We then checked these with conductivity and they were greater than 40 at  4, 20 on the left at 5, and 40 on the right at 5.  We also placed a  hockey stick probe through the foramen widely patent, and then took  through the medial border of the pedicles and there was no evidence of breeching medially or laterally.  Prior to placement of screws, we  checked  the pedicle screw accuracy with a probe and it was found to be  in bone in all cases.  Prior to insertion of the screws, we placed bone  graft and the Vitoss laterally as well as the autologous bone.  A short  curved rod  was then affixed to each pedicle screw and temporarily a set  screw was used to hold that in place.  The wound was copiously irrigated  throughout.  Bipolar cautery was utilized to achieve hemostasis.  He had  a fair amount of oozing throughout the case and we therefore had  multiple instances where we had to perform electrocautery.  We  compressed on either side, tightening first the screw at 4 and then  compressing on over the interspace and then torquing the set screws 4  and 5 bilaterally.  We checked the length of rod and it was satisfactory  bilaterally.  Then we proceeded with checking the foramen with a hockey  stick probe at 4 and 5, they were widely patent.  In the AP and lateral  plane, the pedicle screws were satisfactorily placed.  The wound was  copiously irrigated. Bipolar electrocautery was utilized to achieve  strict hemostasis.  We tried a Valsalva again, no CSF leakage.  We did  mixed some Tisseel up in the appropriate manner and then placed it over  the thecal sac just for added adhesion.  I then removed the self-  retaining retractor and the paraspinous muscles were inspected, no  evidence of any active bleeding.  It was copiously irrigated with  antibiotic irrigation.  A couple times throughout the case, we had to  release the self-retaining retractor to allow for adequate flow of the  blood into the musculature.  Then again, we achieved hemostasis.  We  brought in a Hemovac and brought it out through a lateral stab wound in  the skin and repaired the dorsal lumbar fascia with a #1 interrupted  figure-of-eight suture.  The subcutaneous tissue was reapproximated with  2-0 Vicryl subcuticular.  The skin was reapproximated with staples.  The  wound was  dressed sterilely.  He was placed supine on the hospital bed,  extubated without difficulty, and transported to recovery room in  satisfactory condition.   The patient tolerated the procedure.  There were no complications.  The  blood loss was 1500 mL.  He received 750 back in [facility].  Dr. Vira Browns was the assistant.      Jene Every, M.D.  Electronically Signed     JB/MEDQ  D:  05/13/2006  T:  05/13/2006  Job:  [id number]

## 2010-08-17 ENCOUNTER — Encounter: Payer: Self-pay | Admitting: *Deleted

## 2010-08-17 ENCOUNTER — Emergency Department (HOSPITAL_BASED_OUTPATIENT_CLINIC_OR_DEPARTMENT_OTHER)
Admission: EM | Admit: 2010-08-17 | Discharge: 2010-08-18 | Disposition: A | Discharge status: Home / Self Care | Payer: 59 | Attending: Emergency Medicine | Admitting: Emergency Medicine

## 2010-08-17 ENCOUNTER — Emergency Department (INDEPENDENT_AMBULATORY_CARE_PROVIDER_SITE_OTHER): Payer: 59

## 2010-08-17 DIAGNOSIS — R410 Disorientation, unspecified: Secondary | ICD-10-CM

## 2010-08-17 DIAGNOSIS — E785 Hyperlipidemia, unspecified: Secondary | ICD-10-CM | POA: Insufficient documentation

## 2010-08-17 DIAGNOSIS — I10 Essential (primary) hypertension: Secondary | ICD-10-CM | POA: Insufficient documentation

## 2010-08-17 DIAGNOSIS — R404 Transient alteration of awareness: Secondary | ICD-10-CM

## 2010-08-17 DIAGNOSIS — F319 Bipolar disorder, unspecified: Secondary | ICD-10-CM | POA: Insufficient documentation

## 2010-08-17 DIAGNOSIS — F29 Unspecified psychosis not due to a substance or known physiological condition: Secondary | ICD-10-CM | POA: Insufficient documentation

## 2010-08-17 HISTORY — DX: Essential (primary) hypertension: I10

## 2010-08-17 HISTORY — DX: Bipolar disorder, unspecified: F31.9

## 2010-08-17 HISTORY — DX: Hyperlipidemia, unspecified: E78.5

## 2010-08-17 MED ORDER — SODIUM CHLORIDE 0.9 % IV SOLN: INTRAVENOUS | Status: DC

## 2010-08-17 NOTE — ED Notes (Signed)
Pt states that he has not been feeling well on Sat he began feeling clumsy today he feels like his speech is slurred pt is alert and oriented slow to respond to questions but answers appropriately pupils equal grips equal bilat

## 2010-08-18 ENCOUNTER — Other Ambulatory Visit: Payer: Self-pay

## 2010-08-18 LAB — COMPREHENSIVE METABOLIC PANEL
ALT: 15 U/L (ref 0–53)
AST: 18 U/L (ref 0–37)
Albumin: 4.4 g/dL (ref 3.5–5.2)
Alkaline Phosphatase: 51 U/L (ref 39–117)
BUN: 15 mg/dL (ref 6–23)
CO2: 23 mEq/L (ref 19–32)
Calcium: 9.7 mg/dL (ref 8.4–10.5)
Chloride: 107 mEq/L (ref 96–112)
Creatinine, Ser: 1 mg/dL (ref 0.50–1.35)
GFR calc Af Amer: >60 mL/min (ref >60)
GFR calc non Af Amer: >60 mL/min (ref >60)
Glucose, Bld: 105 mg/dL — ABNORMAL HIGH (ref 70–99)
Potassium: 3.4 mEq/L — ABNORMAL LOW (ref 3.5–5.1)
Sodium: 143 mEq/L (ref 135–145)
Total Bilirubin: 0.2 mg/dL — ABNORMAL LOW (ref 0.3–1.2)
Total Protein: 7 g/dL (ref 6.0–8.3)

## 2010-08-18 LAB — URINALYSIS, ROUTINE W REFLEX MICROSCOPIC
Bilirubin Urine: NEGATIVE
Glucose, UA: NEGATIVE mg/dL
Hgb urine dipstick: NEGATIVE
Ketones, ur: NEGATIVE mg/dL
Leukocytes, UA: NEGATIVE
Nitrite: NEGATIVE
Protein, ur: NEGATIVE mg/dL
Specific Gravity, Urine: 1.013 (ref 1.005–1.030)
Urobilinogen, UA: 1 mg/dL (ref 0.0–1.0)
pH: 7.5 (ref 5.0–8.0)

## 2010-08-18 LAB — RAPID URINE DRUG SCREEN, HOSP PERFORMED
Amphetamines: NOT DETECTED
Barbiturates: NOT DETECTED
Benzodiazepines: NOT DETECTED
Cocaine: NOT DETECTED
Opiates: NOT DETECTED
Tetrahydrocannabinol: NOT DETECTED

## 2010-08-18 LAB — CBC
HCT: 33.8 % — ABNORMAL LOW (ref 39.0–52.0)
Hemoglobin: 11.7 g/dL — ABNORMAL LOW (ref 13.0–17.0)
MCH: 32 pg (ref 26.0–34.0)
MCHC: 34.6 g/dL (ref 30.0–36.0)
MCV: 92.3 fL (ref 78.0–100.0)
Platelets: 201 10*3/uL (ref 150–400)
RBC: 3.66 MIL/uL — ABNORMAL LOW (ref 4.22–5.81)
RDW: 12.9 % (ref 11.5–15.5)
WBC: 7.6 10*3/uL (ref 4.0–10.5)

## 2010-08-18 LAB — LITHIUM LEVEL: Lithium Lvl: 0.44 mEq/L — ABNORMAL LOW (ref 0.80–1.40)

## 2010-08-18 NOTE — ED Provider Notes (Addendum)
History     Chief Complaint  Patient presents with  . Altered Mental Status   The history is provided by the patient.  STARTED NEW DRUG ON Friday BY HIS PSYCHIATRIST TO HELP WITH TREMORS AND ON Friday NIGHT HAD A CONFUSION EPISODE. SINCE THEN HAS NOT FELT CLEAR IN THE HEAD AND COORDINATION HAS BEEN OFF. NO DISTINCT FOCAL FINDINGS. NO HEADACHE. LONG HX OF PSYCHIATIC DISORDERS ON LITHIUM , NO FEVER, CP, SOB, ABD PAIN, BACK PAIN, N/V/D.   Past Medical History  Diagnosis Date  . Hypertension   . Hyperlipemia   . Bipolar 1 disorder     Past Surgical History  Procedure Date  . Joint replacement   . Back surgery     History reviewed. No pertinent family history.  History  Substance Use Topics  . Smoking status: Never Smoker   . Smokeless tobacco: Not on file  . Alcohol Use: Not on file     occasssional      Review of Systems  Constitutional: Positive for fatigue. Negative for fever and chills.  HENT: Negative for congestion and neck pain.   Eyes: Negative for photophobia and pain.  Respiratory: Negative for cough, chest tightness and shortness of breath.   Cardiovascular: Negative for chest pain and leg swelling.  Gastrointestinal: Negative for nausea, vomiting and abdominal pain.  Genitourinary: Negative for hematuria.  Musculoskeletal: Negative for back pain.  Skin: Positive for rash.  Neurological: Positive for speech difficulty. Negative for dizziness, facial asymmetry, weakness and numbness.  Hematological: Negative for adenopathy.  Psychiatric/Behavioral: Positive for confusion.    Physical Exam  BP 129/64  Pulse 63  Temp(Src) 99.4 F (37.4 C) (Oral)  Resp 20  SpO2 98%  Physical Exam  Constitutional: He is oriented to person, place, and time. He appears well-developed and well-nourished.  HENT:  Head: Normocephalic and atraumatic.  Mouth/Throat: Oropharynx is clear and moist.  Eyes: Conjunctivae and EOM are normal. Pupils are equal, round, and reactive to  light.  Neck: Normal range of motion. Neck supple.  Cardiovascular: Normal rate and normal heart sounds.   No murmur heard. Pulmonary/Chest: Effort normal and breath sounds normal. He has no wheezes. He has no rales. He exhibits no tenderness.  Abdominal: Soft. Bowel sounds are normal. There is no tenderness.  Musculoskeletal: Normal range of motion. He exhibits no edema.  Neurological: He is alert and oriented to person, place, and time. No cranial nerve deficit. He exhibits normal muscle tone.       SOME TREMOR BUT COORDINATION GROSSLY INTACT.   Skin: Skin is warm and dry. No rash noted.  Psychiatric: His behavior is normal.    ED Course  Procedures  Results for orders placed during the hospital encounter of 08/17/10  CBC      Component Value Range   WBC 7.6  4.0 - 10.5 (K/uL)   RBC 3.66 (*) 4.22 - 5.81 (MIL/uL)   Hemoglobin 11.7 (*) 13.0 - 17.0 (g/dL)   HCT 16.1 (*) 09.6 - 52.0 (%)   MCV 92.3  78.0 - 100.0 (fL)   MCH 32.0  26.0 - 34.0 (pg)   MCHC 34.6  30.0 - 36.0 (g/dL)   RDW 04.5  40.9 - 81.1 (%)   Platelets 201  150 - 400 (K/uL)  COMPREHENSIVE METABOLIC PANEL      Component Value Range   Sodium 143  135 - 145 (mEq/L)   Potassium 3.4 (*) 3.5 - 5.1 (mEq/L)   Chloride 107  96 - 112 (mEq/L)  CO2 23  19 - 32 (mEq/L)   Glucose, Bld 105 (*) 70 - 99 (mg/dL)   BUN 15  6 - 23 (mg/dL)   Creatinine, Ser 2.95  0.50 - 1.35 (mg/dL)   Calcium 9.7  8.4 - 62.1 (mg/dL)   Total Protein 7.0  6.0 - 8.3 (g/dL)   Albumin 4.4  3.5 - 5.2 (g/dL)   AST 18  0 - 37 (U/L)   ALT 15  0 - 53 (U/L)   Alkaline Phosphatase 51  39 - 117 (U/L)   Total Bilirubin 0.2 (*) 0.3 - 1.2 (mg/dL)   GFR calc non Af Amer >60  >60 (mL/min)   GFR calc Af Amer >60  >60 (mL/min)  URINALYSIS, ROUTINE W REFLEX MICROSCOPIC      Component Value Range   Color, Urine YELLOW  YELLOW    Appearance CLOUDY (*) CLEAR    Specific Gravity, Urine 1.013  1.005 - 1.030    pH 7.5  5.0 - 8.0    Glucose, UA NEGATIVE  NEGATIVE  (mg/dL)   Hgb urine dipstick NEGATIVE  NEGATIVE    Bilirubin Urine NEGATIVE  NEGATIVE    Ketones, ur NEGATIVE  NEGATIVE (mg/dL)   Protein, ur NEGATIVE  NEGATIVE (mg/dL)   Urobilinogen, UA 1.0  0.0 - 1.0 (mg/dL)   Nitrite NEGATIVE  NEGATIVE    Leukocytes, UA NEGATIVE  NEGATIVE   DRUG SCREEN PANEL, EMERGENCY      Component Value Range   Opiates NONE DETECTED  NONE DETECTED    Cocaine NONE DETECTED  NONE DETECTED    Benzodiazepines NONE DETECTED  NONE DETECTED    Amphetamines NONE DETECTED  NONE DETECTED    Tetrahydrocannabinol NONE DETECTED  NONE DETECTED    Barbiturates NONE DETECTED  NONE DETECTED   LITHIUM LEVEL      Component Value Range   Lithium Lvl 0.44 (*) 0.80 - 1.40 (mEq/L)   Dg Chest 2 View  08/18/2010  *RADIOLOGY REPORT*  Clinical Data: 60 year old male altered mental status.  CHEST - 2 VIEW  Comparison: 05/05/03.  Findings: Stable lung volumes.  Cardiac size and mediastinal contours are within normal limits.  Visualized tracheal air column is within normal limits.  No pneumothorax, pulmonary edema, pleural effusion or acute pulmonary opacity. No acute osseous abnormality identified.  ACDF hardware lower cervical spine.  IMPRESSION: No acute cardiopulmonary abnormality.  Original Report Authenticated By: Harley Hallmark, M.D.   Ct Head Wo Contrast  08/18/2010  *RADIOLOGY REPORT*  Clinical Data: 60-year-old male altered mental status.  CT HEAD WITHOUT CONTRAST  Technique:  Contiguous axial images were obtained from the base of the skull through the vertex without contrast.  Comparison: None.  Findings: Visualized orbits and scalp soft tissues are within normal limits.  Visualized paranasal sinuses and mastoids are clear.  No acute osseous abnormality identified.  Cerebral volume is within normal limits for age.  No midline shift, ventriculomegaly, mass effect, evidence of mass lesion, intracranial hemorrhage or evidence of cortically based acute infarction.  Gray-white matter  differentiation is within normal limits throughout the brain.  Incidental cavum septum pellucidum. No suspicious intracranial vascular hyperdensity.  IMPRESSION: Normal noncontrast CT appearance of the brain for age.  Original Report Authenticated By: Harley Hallmark, M.D.  MDM SUSPECT REALTED TO NEW MED STARTED ON Friday TRIHEXYPHENIDYL AND RECO'D HOLDING IT UNTIL SEEN BY HIS PSYCHIATRSIT    Date: 08/18/2010  Rate: 68  Rhythm: normal sinus rhythm  QRS Axis: normal  Intervals: normal  ST/T  Wave abnormalities: normal  Conduction Disutrbances:first-degree A-V block  and nonspecific intraventricular conduction delay  Narrative Interpretation:   Old EKG Reviewed: none available     Shelda Jakes, MD 08/18/10 1610  Shelda Jakes, MD 08/18/10 [phone]

## 2010-11-07 LAB — BASIC METABOLIC PANEL
BUN: 11
BUN: 12
CO2: 25
CO2: 25
Calcium: 9.2
Calcium: 9.7
Chloride: 103
Chloride: 107
Creatinine, Ser: 0.99
Creatinine, Ser: 1.02
GFR calc Af Amer: >60
GFR calc Af Amer: >60
GFR calc non Af Amer: >60
GFR calc non Af Amer: >60
Glucose, Bld: 134 — ABNORMAL HIGH
Glucose, Bld: 98
Potassium: 4
Potassium: 4.2
Sodium: 136
Sodium: 141

## 2010-11-07 LAB — CBC
HCT: 32.7 — ABNORMAL LOW
HCT: 39
Hemoglobin: 11.7 — ABNORMAL LOW
Hemoglobin: 13.5
MCHC: 34.7
MCHC: 35.7
MCV: 91.5
MCV: 92.2
Platelets: 219
Platelets: 239
RBC: 3.57 — ABNORMAL LOW
RBC: 4.23
RDW: 13.2
RDW: 13.5
WBC: 11.2 — ABNORMAL HIGH
WBC: 6.7

## 2012-04-25 ENCOUNTER — Other Ambulatory Visit: Payer: Self-pay | Admitting: Urology

## 2012-05-13 ENCOUNTER — Encounter (HOSPITAL_COMMUNITY): Payer: Self-pay | Admitting: Pharmacy Technician

## 2012-05-23 ENCOUNTER — Ambulatory Visit (HOSPITAL_COMMUNITY)
Admission: RE | Admit: 2012-05-23 | Discharge: 2012-05-23 | Disposition: A | Discharge status: Home / Self Care | Payer: 59 | Source: Ambulatory Visit | Attending: Urology | Admitting: Urology

## 2012-05-23 ENCOUNTER — Encounter (HOSPITAL_COMMUNITY): Payer: Self-pay

## 2012-05-23 ENCOUNTER — Encounter (HOSPITAL_COMMUNITY)
Admission: RE | Admit: 2012-05-23 | Discharge: 2012-05-23 | Disposition: A | Discharge status: Home / Self Care | Payer: 59 | Source: Ambulatory Visit | Attending: Urology | Admitting: Urology

## 2012-05-23 DIAGNOSIS — Z79899 Other long term (current) drug therapy: Secondary | ICD-10-CM | POA: Insufficient documentation

## 2012-05-23 DIAGNOSIS — Z01810 Encounter for preprocedural cardiovascular examination: Secondary | ICD-10-CM | POA: Insufficient documentation

## 2012-05-23 DIAGNOSIS — Z01812 Encounter for preprocedural laboratory examination: Secondary | ICD-10-CM | POA: Insufficient documentation

## 2012-05-23 DIAGNOSIS — I10 Essential (primary) hypertension: Secondary | ICD-10-CM | POA: Insufficient documentation

## 2012-05-23 HISTORY — DX: Tremor, unspecified: R25.1

## 2012-05-23 HISTORY — DX: Unspecified osteoarthritis, unspecified site: M19.90

## 2012-05-23 LAB — BASIC METABOLIC PANEL
BUN: 19 mg/dL (ref 6–23)
CO2: 26 mEq/L (ref 19–32)
Calcium: 9.9 mg/dL (ref 8.4–10.5)
Chloride: 105 mEq/L (ref 96–112)
Creatinine, Ser: 0.79 mg/dL (ref 0.50–1.35)
GFR calc Af Amer: >90 mL/min (ref >90)
GFR calc non Af Amer: >90 mL/min (ref >90)
Glucose, Bld: 93 mg/dL (ref 70–99)
Potassium: 3.8 mEq/L (ref 3.5–5.1)
Sodium: 141 mEq/L (ref 135–145)

## 2012-05-23 LAB — CBC
HCT: 39.3 % (ref 39.0–52.0)
Hemoglobin: 13.7 g/dL (ref 13.0–17.0)
MCH: 31.9 pg (ref 26.0–34.0)
MCHC: 34.9 g/dL (ref 30.0–36.0)
MCV: 91.6 fL (ref 78.0–100.0)
Platelets: 225 10*3/uL (ref 150–400)
RBC: 4.29 MIL/uL (ref 4.22–5.81)
RDW: 13.2 % (ref 11.5–15.5)
WBC: 4.2 10*3/uL (ref 4.0–10.5)

## 2012-05-23 LAB — SURGICAL PCR SCREEN
MRSA, PCR: NEGATIVE
Staphylococcus aureus: NEGATIVE

## 2012-05-23 NOTE — Patient Instructions (Addendum)
Bill Guerrero  05/23/2012                           YOUR PROCEDURE IS SCHEDULED ON:  05/31/12               PLEASE REPORT TO SHORT STAY CENTER AT :  5:15 AM               CALL THIS NUMBER IF ANY PROBLEMS THE DAY OF SURGERY :               [phone]                      REMEMBER:   Do not eat food or drink liquids AFTER MIDNIGHT    Take these medicines the morning of surgery with A SIP OF WATER:  FENFIBRATE / OXCARBAZEPINE / PRAVASTATIN / PRIMIDONE / VERAPAMIL   Do not wear jewelry, make-up   Do not wear lotions, powders, or perfumes.   Do not shave legs or underarms 12 hrs. before surgery (men may shave face)  Do not bring valuables to the hospital.  Contacts, dentures or bridgework may not be worn into surgery.  Leave suitcase in the car. After surgery it may be brought to your room.  For patients admitted to the hospital more than one night, checkout time is 11:00                          The day of discharge.   Patients discharged the day of surgery will not be allowed to drive home                             If going home same day of surgery, must have someone stay with you first                           24 hrs at home and arrange for some one to drive you home from hospital.    Special Instructions:   Please read over the following fact sheets that you were given:               1. MRSA  INFORMATION                      2. [facility] PREPARING FOR SURGERY SHEET                                                X_____________________________________________________________________        Failure to follow these instructions may result in cancellation of your surgery

## 2012-05-31 ENCOUNTER — Ambulatory Visit (HOSPITAL_COMMUNITY): Payer: 59 | Admitting: Anesthesiology

## 2012-05-31 ENCOUNTER — Encounter (HOSPITAL_COMMUNITY): Payer: Self-pay | Admitting: *Deleted

## 2012-05-31 ENCOUNTER — Encounter (HOSPITAL_COMMUNITY): Payer: Self-pay | Admitting: Anesthesiology

## 2012-05-31 ENCOUNTER — Ambulatory Visit (HOSPITAL_COMMUNITY)
Admission: RE | Admit: 2012-05-31 | Discharge: 2012-05-31 | Disposition: A | Discharge status: Home / Self Care | Payer: 59 | Source: Ambulatory Visit | Attending: Urology | Admitting: Urology

## 2012-05-31 ENCOUNTER — Encounter (HOSPITAL_COMMUNITY)
Admission: RE | Disposition: A | Discharge status: Home / Self Care | Payer: Self-pay | Source: Ambulatory Visit | Attending: Urology

## 2012-05-31 DIAGNOSIS — I10 Essential (primary) hypertension: Secondary | ICD-10-CM | POA: Insufficient documentation

## 2012-05-31 DIAGNOSIS — Z79899 Other long term (current) drug therapy: Secondary | ICD-10-CM | POA: Insufficient documentation

## 2012-05-31 DIAGNOSIS — E785 Hyperlipidemia, unspecified: Secondary | ICD-10-CM | POA: Insufficient documentation

## 2012-05-31 DIAGNOSIS — N529 Male erectile dysfunction, unspecified: Secondary | ICD-10-CM | POA: Insufficient documentation

## 2012-05-31 DIAGNOSIS — F319 Bipolar disorder, unspecified: Secondary | ICD-10-CM | POA: Insufficient documentation

## 2012-05-31 DIAGNOSIS — R259 Unspecified abnormal involuntary movements: Secondary | ICD-10-CM | POA: Insufficient documentation

## 2012-05-31 DIAGNOSIS — M129 Arthropathy, unspecified: Secondary | ICD-10-CM | POA: Insufficient documentation

## 2012-05-31 DIAGNOSIS — Z538 Procedure and treatment not carried out for other reasons: Secondary | ICD-10-CM | POA: Insufficient documentation

## 2012-05-31 SURGERY — VASECTOMY
Anesthesia: General

## 2012-05-31 MED ORDER — BUPIVACAINE HCL (PF) 0.25 % IJ SOLN
INTRAMUSCULAR | Status: AC
  Filled 2012-05-31: qty 30

## 2012-05-31 MED ORDER — LACTATED RINGERS IV SOLN
INTRAVENOUS | Status: DC | PRN
  Administered 2012-05-31: 07:00:00 via INTRAVENOUS

## 2012-05-31 MED ORDER — ACETAMINOPHEN 10 MG/ML IV SOLN
INTRAVENOUS | Status: AC
  Filled 2012-05-31: qty 100

## 2012-05-31 MED ORDER — HYDROMORPHONE HCL PF 1 MG/ML IJ SOLN: 0.2500 mg | INTRAMUSCULAR | Status: DC | PRN

## 2012-05-31 MED ORDER — LACTATED RINGERS IV SOLN: INTRAVENOUS | Status: DC

## 2012-05-31 MED ORDER — FENTANYL CITRATE 0.05 MG/ML IJ SOLN
INTRAMUSCULAR | Status: DC | PRN
  Administered 2012-05-31: 75 ug via INTRAVENOUS

## 2012-05-31 MED ORDER — ACETAMINOPHEN 10 MG/ML IV SOLN
INTRAVENOUS | Status: DC | PRN
  Administered 2012-05-31: 1000 mg via INTRAVENOUS

## 2012-05-31 MED ORDER — MIDAZOLAM HCL 5 MG/5ML IJ SOLN
INTRAMUSCULAR | Status: DC | PRN
  Administered 2012-05-31: 2 mg via INTRAVENOUS

## 2012-05-31 MED ORDER — ACETAMINOPHEN 10 MG/ML IV SOLN: 1000.0000 mg | Freq: Once | INTRAVENOUS | Status: DC | PRN

## 2012-05-31 MED ORDER — PROPOFOL 10 MG/ML IV BOLUS
INTRAVENOUS | Status: DC | PRN
  Administered 2012-05-31: 170 mg via INTRAVENOUS

## 2012-05-31 MED ORDER — PROMETHAZINE HCL 25 MG/ML IJ SOLN: 6.2500 mg | INTRAMUSCULAR | Status: DC | PRN

## 2012-05-31 MED ORDER — OXYCODONE HCL 5 MG PO TABS: 5.0000 mg | ORAL_TABLET | Freq: Once | ORAL | Status: DC | PRN

## 2012-05-31 MED ORDER — MEPERIDINE HCL 50 MG/ML IJ SOLN: 6.2500 mg | INTRAMUSCULAR | Status: DC | PRN

## 2012-05-31 MED ORDER — SODIUM CHLORIDE 0.9 % IV SOLN
INTRAVENOUS | Status: AC
  Filled 2012-05-31: qty 1.5

## 2012-05-31 MED ORDER — CHLORHEXIDINE GLUCONATE 4 % EX LIQD
Freq: Once | CUTANEOUS | Status: DC
  Filled 2012-05-31: qty 15

## 2012-05-31 MED ORDER — SODIUM CHLORIDE 0.9 % IV SOLN
1.5000 g | INTRAVENOUS | Status: AC
  Administered 2012-05-31: 1.5 g via INTRAVENOUS

## 2012-05-31 MED ORDER — OXYCODONE HCL 5 MG/5ML PO SOLN: 5.0000 mg | Freq: Once | ORAL | Status: DC | PRN

## 2012-05-31 NOTE — Op Note (Signed)
See brief op note

## 2012-05-31 NOTE — Transfer of Care (Signed)
Immediate Anesthesia Transfer of Care Note  Patient: Bill Guerrero  Procedure(s) Performed: Procedure(s): BILATERAL VASECTOMY (Bilateral) THREE PIECE INFLATABLE PENILE PROTHESIS  (N/A)  Patient Location: PACU  Anesthesia Type:General  Level of Consciousness: awake, sedated and patient cooperative  Airway & Oxygen Therapy: Patient Spontanous Breathing and Patient connected to face mask oxygen  Post-op Assessment: Report given to PACU RN and Post -op Vital signs reviewed and stable  Post vital signs: Reviewed and stable  Complications: No apparent anesthesia complications

## 2012-05-31 NOTE — H&P (Signed)
H&P   History of Present Illness: 62 yo present with long history of ED for three-piece IPP. His wife is younger and he also desires vasectomy. He has been well. No dysuria or hematuria. No fever.    Past Medical History  Diagnosis Date  . Hypertension   . Hyperlipemia   . Bipolar 1 disorder   . Tremor     RT HAND  . Arthritis    Past Surgical History  Procedure Laterality Date  . Pilonidal cyst excision  1986  . Groin mass open biopsy      MASS REMOVED FROM GROIN  . Rotator cuff repair      RIGHT  . Joint replacement      TOTAL LEFT HIP  . Back surgery      CERVICAL AND LUMBAR  . Hemorroidectomy    . Hernia repair      BIL IN HERNIA    Home Medications:  Prescriptions prior to admission  Medication Sig Dispense Refill  . fenofibrate 160 MG tablet Take 160 mg by mouth daily before breakfast.      . lamoTRIgine (LAMICTAL) 200 MG tablet Take 200 mg by mouth at bedtime.      . OXcarbazepine (TRILEPTAL) 150 MG tablet Take 150 mg by mouth 2 (two) times daily.       . pravastatin (PRAVACHOL) 80 MG tablet Take 80 mg by mouth daily before breakfast.       . primidone (MYSOLINE) 50 MG tablet Take 100 mg by mouth 2 (two) times daily.      . QUEtiapine Fumarate (SEROQUEL XR) 150 MG TB24 Take 1 tablet by mouth at bedtime.       . verapamil (COVERA HS) 240 MG (CO) 24 hr tablet Take 240 mg by mouth daily before breakfast.       . Ascorbic Acid (VITAMIN C) 1000 MG tablet Take 1,000 mg by mouth daily.      . B Complex-C (B-COMPLEX WITH VITAMIN C) tablet Take 1 tablet by mouth daily.      . Cholecalciferol (VITAMIN D-3) 5000 UNITS TABS Take 1 tablet by mouth daily.      . Coenzyme Q10 200 MG capsule Take 200 mg by mouth 2 (two) times daily.      Marland Kitchen DHEA 50 MG TABS Take 1 tablet by mouth 2 (two) times daily.      . fish oil-omega-3 fatty acids 1000 MG capsule Take 4 g by mouth 2 (two) times daily.       . magnesium oxide (MAG-OX) 400 MG tablet Take 400 mg by mouth daily.      .  Multiple Vitamin (MULTIVITAMIN) tablet Take 1 tablet by mouth daily.        . niacin 500 MG tablet Take 500 mg by mouth daily with breakfast.      . Potassium 99 MG TABS Take 1 tablet by mouth 2 (two) times daily.      Marland Kitchen zolpidem (AMBIEN) 5 MG tablet Take 5 mg by mouth at bedtime as needed for sleep.       Allergies: No Known Allergies  History reviewed. No pertinent family history. Social History:  reports that he has never smoked. He does not have any smokeless tobacco history on file. He reports that he does not drink alcohol or use illicit drugs.  ROS: A complete review of systems was performed.  All systems are negative except for pertinent findings as noted. @ROS @   Physical Exam:  Vital signs in  last 24 hours: Temp:  [98.3 F (36.8 C)] 98.3 F (36.8 C) (04/29 0517) Pulse Rate:  [72] 72 (04/29 0517) Resp:  [18] 18 (04/29 0517) BP: (131)/(78) 131/78 mmHg (04/29 0517) SpO2:  [100 %] 100 % (04/29 0517) General:  Alert and oriented, No acute distress HEENT: Normocephalic, atraumatic Neck: No JVD or lymphadenopathy Cardiovascular: Regular rate and rhythm Lungs: Regular rate and effort Abdomen: Soft, nontender, nondistended, no abdominal masses Back: No CVA tenderness Extremities: No edema Neurologic: Grossly intact  Laboratory Data:  No results found for this or any previous visit (from the past 24 hour(s)). Recent Results (from the past 240 hour(s))  SURGICAL PCR SCREEN     Status: None   Collection Time    05/23/12 11:48 AM      Result Value Range Status   MRSA, PCR NEGATIVE  NEGATIVE Final   Staphylococcus aureus NEGATIVE  NEGATIVE Final   Comment:            The Xpert SA Assay (FDA     approved for NASAL specimens     in patients over 38 years of age),     is one component of     a comprehensive surveillance     program.  Test performance has     been validated by [employer] for patients greater     than or equal to 73 year old.     It is not intended      to diagnose infection nor to     guide or monitor treatment.   Creatinine: No results found for this basename: CREATININE,  in the last 168 hours  CBC, BMP, CXR stable  Impression/Assessment:  -impotence -vasectomy  Plan:  I discussed with the patient the nature, potential benefits, risks and alternatives to inflatable penile prosthesis and vasectomy, including side effects of the proposed treatment, the likelihood of the patient achieving the goals of the procedure, and any potential problems that might occur during the procedure or recuperation. All questions answered. Patient elects to proceed.   Bill Guerrero 05/31/2012, 7:30 AM

## 2012-05-31 NOTE — Anesthesia Postprocedure Evaluation (Signed)
Anesthesia Post Note  Patient: Bill Guerrero  Procedure(s) Performed: Procedure(s) (LRB): BILATERAL VASECTOMY (Bilateral) THREE PIECE INFLATABLE PENILE PROTHESIS  (N/A)  Anesthesia type: General  Patient location: PACU  Post pain: Pain level controlled  Post assessment: Post-op Vital signs reviewed  Last Vitals: BP 124/62  Pulse 59  Temp(Src) 36.6 C (Oral)  Resp 16  SpO2 100%  Post vital signs: Reviewed  Level of consciousness: sedated  Complications: No apparent anesthesia complications

## 2012-05-31 NOTE — Brief Op Note (Signed)
05/31/2012  8:25 AM  PATIENT:  Bill Guerrero  62 y.o. male  PRE-OPERATIVE DIAGNOSIS:  family planning, impotence  POST-OPERATIVE DIAGNOSIS:  * No post-op diagnosis entered *  PROCEDURE:  Procedure(s): BILATERAL VASECTOMY (Bilateral) THREE PIECE INFLATABLE PENILE PROTHESIS  (N/A)  SURGEON:  Surgeon(s) and Role:    * [name], MD - Primary  NO PROCEDURE PERFORMED. EQUIPMENT NOT AVAILABLE.  PATIENT DISPOSITION:  PACU - hemodynamically stable.

## 2012-05-31 NOTE — Anesthesia Preprocedure Evaluation (Addendum)
Anesthesia Evaluation  Patient identified by MRN, date of birth, ID band Patient awake    Reviewed: Allergy & Precautions, H&P , NPO status , Patient's Chart, lab work & pertinent test results  Airway Mallampati: I TM Distance: >3 FB Neck ROM: Full    Dental  (+) Dental Advisory Given and Teeth Intact   Pulmonary neg pulmonary ROS,  breath sounds clear to auscultation  Pulmonary exam normal       Cardiovascular hypertension, Pt. on medications negative cardio ROS  Rhythm:Regular Rate:Normal     Neuro/Psych PSYCHIATRIC DISORDERS negative neurological ROS  negative psych ROS   GI/Hepatic negative GI ROS, Neg liver ROS,   Endo/Other  negative endocrine ROS  Renal/GU negative Renal ROS     Musculoskeletal negative musculoskeletal ROS (+)   Abdominal   Peds  Hematology negative hematology ROS (+)   Anesthesia Other Findings   Reproductive/Obstetrics                          Anesthesia Physical Anesthesia Plan  ASA: II  Anesthesia Plan: General   Post-op Pain Management:    Induction: Intravenous  Airway Management Planned: LMA  Additional Equipment:   Intra-op Plan:   Post-operative Plan: Extubation in OR  Informed Consent: I have reviewed the patients History and Physical, chart, labs and discussed the procedure including the risks, benefits and alternatives for the proposed anesthesia with the patient or authorized representative who has indicated his/her understanding and acceptance.   Dental advisory given  Plan Discussed with: CRNA  Anesthesia Plan Comments:         Anesthesia Quick Evaluation

## 2012-05-31 NOTE — Progress Notes (Signed)
Dr. Mena Goes in to talk with pt

## 2012-06-02 ENCOUNTER — Other Ambulatory Visit: Payer: Self-pay | Admitting: Urology

## 2012-06-06 ENCOUNTER — Encounter (HOSPITAL_COMMUNITY): Payer: Self-pay | Admitting: Pharmacy Technician

## 2012-06-10 ENCOUNTER — Other Ambulatory Visit (HOSPITAL_COMMUNITY): Payer: Self-pay | Admitting: Urology

## 2012-06-10 NOTE — Progress Notes (Signed)
Cbc and bmet 05-23-2012 epic Chest 2 view xray and ekg 05-23-2012 epic

## 2012-06-13 ENCOUNTER — Encounter (HOSPITAL_COMMUNITY)
Admission: RE | Admit: 2012-06-13 | Discharge: 2012-06-13 | Disposition: A | Discharge status: Home / Self Care | Payer: 59 | Source: Ambulatory Visit | Attending: Urology | Admitting: Urology

## 2012-06-13 ENCOUNTER — Encounter (HOSPITAL_COMMUNITY): Payer: Self-pay

## 2012-06-13 LAB — SURGICAL PCR SCREEN
MRSA, PCR: NEGATIVE
Staphylococcus aureus: NEGATIVE

## 2012-06-13 NOTE — Patient Instructions (Signed)
Bill Guerrero  06/13/2012                           YOUR PROCEDURE IS SCHEDULED ON: 02/18/12               PLEASE REPORT TO SHORT STAY CENTER AT : 5:30 AM               CALL THIS NUMBER IF ANY PROBLEMS THE DAY OF SURGERY :               [phone]                      REMEMBER:   Do not eat food or drink liquids AFTER MIDNIGHT   Take these medicines the morning of surgery with A SIP OF WATER: FENOFIBRATE / TRILEPTAL / PRAVASTATIN / PRIMIDONE / VERAPAMIL   Do not wear jewelry, make-up   Do not wear lotions, powders, or perfumes.   Do not shave legs or underarms 12 hrs. before surgery (men may shave face)  Do not bring valuables to the hospital.  Contacts, dentures or bridgework may not be worn into surgery.  Leave suitcase in the car. After surgery it may be brought to your room.  For patients admitted to the hospital more than one night, checkout time is 11:00                          The day of discharge.   Patients discharged the day of surgery will not be allowed to drive home                             If going home same day of surgery, must have someone stay with you first                           24 hrs at home and arrange for some one to drive you home from hospital.    Special Instructions:   Please read over the following fact sheets that you were given:               1. MRSA  INFORMATION                      2. [facility] PREPARING FOR SURGERY SHEET                                                X_____________________________________________________________________        Failure to follow these instructions may result in cancellation of your surgery

## 2012-06-16 NOTE — Anesthesia Preprocedure Evaluation (Addendum)
Anesthesia Evaluation  Patient identified by MRN, date of birth, ID band Patient awake    Reviewed: Allergy & Precautions, H&P , NPO status , Patient's Chart, lab work & pertinent test results  Airway Mallampati: I TM Distance: >3 FB Neck ROM: Full    Dental  (+) Dental Advisory Given and Teeth Intact   Pulmonary neg pulmonary ROS,  breath sounds clear to auscultation  Pulmonary exam normal       Cardiovascular hypertension, Pt. on medications negative cardio ROS  Rhythm:Regular Rate:Normal     Neuro/Psych PSYCHIATRIC DISORDERS negative neurological ROS  negative psych ROS   GI/Hepatic negative GI ROS, Neg liver ROS,   Endo/Other  negative endocrine ROS  Renal/GU negative Renal ROS     Musculoskeletal negative musculoskeletal ROS (+)   Abdominal   Peds  Hematology negative hematology ROS (+)   Anesthesia Other Findings   Reproductive/Obstetrics                           Anesthesia Physical Anesthesia Plan  ASA: II  Anesthesia Plan: General   Post-op Pain Management:    Induction: Intravenous  Airway Management Planned: Oral ETT  Additional Equipment:   Intra-op Plan:   Post-operative Plan: Extubation in OR  Informed Consent: I have reviewed the patients History and Physical, chart, labs and discussed the procedure including the risks, benefits and alternatives for the proposed anesthesia with the patient or authorized representative who has indicated his/her understanding and acceptance.   Dental advisory given  Plan Discussed with: CRNA  Anesthesia Plan Comments:         Anesthesia Quick Evaluation

## 2012-06-17 ENCOUNTER — Observation Stay (HOSPITAL_COMMUNITY)
Admission: RE | Admit: 2012-06-17 | Discharge: 2012-06-18 | Disposition: A | Discharge status: Home / Self Care | Payer: 59 | Source: Ambulatory Visit | Attending: Urology | Admitting: Urology

## 2012-06-17 ENCOUNTER — Encounter (HOSPITAL_COMMUNITY): Payer: Self-pay | Admitting: Anesthesiology

## 2012-06-17 ENCOUNTER — Encounter (HOSPITAL_COMMUNITY)
Admission: RE | Disposition: A | Discharge status: Home / Self Care | Payer: Self-pay | Source: Ambulatory Visit | Attending: Urology

## 2012-06-17 ENCOUNTER — Encounter (HOSPITAL_COMMUNITY): Payer: Self-pay | Admitting: *Deleted

## 2012-06-17 ENCOUNTER — Ambulatory Visit (HOSPITAL_COMMUNITY): Payer: 59 | Admitting: Anesthesiology

## 2012-06-17 DIAGNOSIS — N489 Disorder of penis, unspecified: Secondary | ICD-10-CM | POA: Insufficient documentation

## 2012-06-17 DIAGNOSIS — E785 Hyperlipidemia, unspecified: Secondary | ICD-10-CM | POA: Insufficient documentation

## 2012-06-17 DIAGNOSIS — I10 Essential (primary) hypertension: Secondary | ICD-10-CM | POA: Insufficient documentation

## 2012-06-17 DIAGNOSIS — N529 Male erectile dysfunction, unspecified: Principal | ICD-10-CM | POA: Insufficient documentation

## 2012-06-17 DIAGNOSIS — Z79899 Other long term (current) drug therapy: Secondary | ICD-10-CM | POA: Insufficient documentation

## 2012-06-17 DIAGNOSIS — Z302 Encounter for sterilization: Secondary | ICD-10-CM | POA: Insufficient documentation

## 2012-06-17 HISTORY — PX: VASECTOMY: SHX75

## 2012-06-17 HISTORY — PX: PENILE PROSTHESIS IMPLANT: SHX240

## 2012-06-17 SURGERY — INSERTION, PENILE PROSTHESIS, INFLATABLE
Anesthesia: General | Site: Scrotum | Wound class: Clean

## 2012-06-17 MED ORDER — SULFAMETHOXAZOLE-TRIMETHOPRIM 800-160 MG PO TABS: 1.0000 | ORAL_TABLET | Freq: Two times a day (BID) | ORAL | Status: DC

## 2012-06-17 MED ORDER — DOCUSATE SODIUM 100 MG PO CAPS
100.0000 mg | ORAL_CAPSULE | Freq: Two times a day (BID) | ORAL | Status: DC
  Administered 2012-06-17 – 2012-06-18 (×3): 100 mg via ORAL
  Filled 2012-06-17 (×4): qty 1

## 2012-06-17 MED ORDER — LAMOTRIGINE 200 MG PO TABS
200.0000 mg | ORAL_TABLET | Freq: Every day | ORAL | Status: DC
  Administered 2012-06-17: 200 mg via ORAL
  Filled 2012-06-17 (×2): qty 1

## 2012-06-17 MED ORDER — DOCUSATE SODIUM 100 MG PO CAPS: 100.0000 mg | ORAL_CAPSULE | Freq: Two times a day (BID) | ORAL | Status: DC

## 2012-06-17 MED ORDER — SODIUM CHLORIDE 0.9 % IR SOLN
Status: DC | PRN
  Administered 2012-06-17: 09:00:00

## 2012-06-17 MED ORDER — GLYCOPYRROLATE 0.2 MG/ML IJ SOLN
INTRAMUSCULAR | Status: DC | PRN
  Administered 2012-06-17: 0.6 mg via INTRAVENOUS

## 2012-06-17 MED ORDER — CHLORHEXIDINE GLUCONATE 4 % EX LIQD
Freq: Once | CUTANEOUS | Status: DC
  Filled 2012-06-17: qty 15

## 2012-06-17 MED ORDER — LACTATED RINGERS IV SOLN
INTRAVENOUS | Status: DC | PRN
  Administered 2012-06-17 (×2): via INTRAVENOUS

## 2012-06-17 MED ORDER — QUETIAPINE FUMARATE ER 50 MG PO TB24
150.0000 mg | ORAL_TABLET | Freq: Every day | ORAL | Status: DC
  Administered 2012-06-17: 150 mg via ORAL
  Filled 2012-06-17 (×2): qty 3

## 2012-06-17 MED ORDER — DIPHENHYDRAMINE HCL 50 MG/ML IJ SOLN: 12.5000 mg | Freq: Four times a day (QID) | INTRAMUSCULAR | Status: DC | PRN

## 2012-06-17 MED ORDER — LACTATED RINGERS IV SOLN: INTRAVENOUS | Status: DC

## 2012-06-17 MED ORDER — PRIMIDONE 50 MG PO TABS
100.0000 mg | ORAL_TABLET | Freq: Two times a day (BID) | ORAL | Status: DC
  Administered 2012-06-17 – 2012-06-18 (×2): 100 mg via ORAL
  Filled 2012-06-17 (×3): qty 2

## 2012-06-17 MED ORDER — LIDOCAINE HCL (PF) 2 % IJ SOLN
INTRAMUSCULAR | Status: DC | PRN
  Administered 2012-06-17: 75 mg

## 2012-06-17 MED ORDER — DIPHENHYDRAMINE HCL 12.5 MG/5ML PO ELIX: 12.5000 mg | ORAL_SOLUTION | Freq: Four times a day (QID) | ORAL | Status: DC | PRN

## 2012-06-17 MED ORDER — OXYBUTYNIN CHLORIDE 5 MG PO TABS
5.0000 mg | ORAL_TABLET | Freq: Three times a day (TID) | ORAL | Status: DC | PRN
  Filled 2012-06-17: qty 1

## 2012-06-17 MED ORDER — ACETAMINOPHEN 10 MG/ML IV SOLN
INTRAVENOUS | Status: DC | PRN
  Administered 2012-06-17: 1000 mg via INTRAVENOUS

## 2012-06-17 MED ORDER — HYDROMORPHONE HCL PF 1 MG/ML IJ SOLN
INTRAMUSCULAR | Status: AC
  Filled 2012-06-17: qty 1

## 2012-06-17 MED ORDER — HYDROCODONE-ACETAMINOPHEN 5-325 MG PO TABS
1.0000 | ORAL_TABLET | ORAL | Status: DC | PRN
  Administered 2012-06-17: 2 via ORAL
  Administered 2012-06-17: 1 via ORAL
  Filled 2012-06-17: qty 2
  Filled 2012-06-17: qty 1

## 2012-06-17 MED ORDER — SODIUM CHLORIDE 0.9 % IV SOLN
INTRAVENOUS | Status: AC
  Filled 2012-06-17: qty 1.5

## 2012-06-17 MED ORDER — SODIUM CHLORIDE 0.9 % IV SOLN
1.5000 g | INTRAVENOUS | Status: AC
  Administered 2012-06-17: 1.5 g via INTRAVENOUS

## 2012-06-17 MED ORDER — ONDANSETRON HCL 4 MG/2ML IJ SOLN
INTRAMUSCULAR | Status: DC | PRN
  Administered 2012-06-17: 4 mg via INTRAVENOUS

## 2012-06-17 MED ORDER — HYDROMORPHONE HCL PF 1 MG/ML IJ SOLN
0.5000 mg | INTRAMUSCULAR | Status: DC | PRN
  Administered 2012-06-17 – 2012-06-18 (×6): 1 mg via INTRAVENOUS
  Filled 2012-06-17 (×6): qty 1

## 2012-06-17 MED ORDER — PROPOFOL 10 MG/ML IV BOLUS
INTRAVENOUS | Status: DC | PRN
  Administered 2012-06-17: 200 mg via INTRAVENOUS

## 2012-06-17 MED ORDER — PRAVASTATIN SODIUM 40 MG PO TABS
80.0000 mg | ORAL_TABLET | Freq: Every day | ORAL | Status: DC
Start: 2012-06-18 — End: 2012-06-18
  Administered 2012-06-18: 80 mg via ORAL
  Filled 2012-06-17: qty 2

## 2012-06-17 MED ORDER — DEXAMETHASONE SODIUM PHOSPHATE 10 MG/ML IJ SOLN
INTRAMUSCULAR | Status: DC | PRN
  Administered 2012-06-17: 4 mg via INTRAVENOUS

## 2012-06-17 MED ORDER — OXCARBAZEPINE 150 MG PO TABS
150.0000 mg | ORAL_TABLET | Freq: Two times a day (BID) | ORAL | Status: DC
  Administered 2012-06-17 – 2012-06-18 (×2): 150 mg via ORAL
  Filled 2012-06-17 (×3): qty 1

## 2012-06-17 MED ORDER — FENTANYL CITRATE 0.05 MG/ML IJ SOLN
INTRAMUSCULAR | Status: DC | PRN
  Administered 2012-06-17 (×2): 50 ug via INTRAVENOUS
  Administered 2012-06-17: 100 ug via INTRAVENOUS
  Administered 2012-06-17: 50 ug via INTRAVENOUS

## 2012-06-17 MED ORDER — ACETAMINOPHEN 10 MG/ML IV SOLN
INTRAVENOUS | Status: AC
  Filled 2012-06-17: qty 100

## 2012-06-17 MED ORDER — BUPIVACAINE HCL (PF) 0.25 % IJ SOLN
INTRAMUSCULAR | Status: AC
  Filled 2012-06-17: qty 30

## 2012-06-17 MED ORDER — SIMVASTATIN 40 MG PO TABS: 40.0000 mg | ORAL_TABLET | Freq: Every day | ORAL | Status: DC

## 2012-06-17 MED ORDER — ZOLPIDEM TARTRATE 5 MG PO TABS
5.0000 mg | ORAL_TABLET | Freq: Every evening | ORAL | Status: DC | PRN
  Administered 2012-06-18: 5 mg via ORAL
  Filled 2012-06-17: qty 1

## 2012-06-17 MED ORDER — OXYCODONE-ACETAMINOPHEN 5-325 MG PO TABS: 1.0000 | ORAL_TABLET | ORAL | Status: DC | PRN

## 2012-06-17 MED ORDER — ONDANSETRON HCL 4 MG/2ML IJ SOLN
4.0000 mg | INTRAMUSCULAR | Status: DC | PRN
  Administered 2012-06-17 – 2012-06-18 (×2): 4 mg via INTRAVENOUS
  Filled 2012-06-17 (×2): qty 2

## 2012-06-17 MED ORDER — LEVOFLOXACIN 500 MG PO TABS
500.0000 mg | ORAL_TABLET | Freq: Every day | ORAL | Status: DC
  Administered 2012-06-17: 500 mg via ORAL
  Filled 2012-06-17 (×2): qty 1

## 2012-06-17 MED ORDER — NEOSTIGMINE METHYLSULFATE 1 MG/ML IJ SOLN
INTRAMUSCULAR | Status: DC | PRN
  Administered 2012-06-17: 4 mg via INTRAVENOUS

## 2012-06-17 MED ORDER — ROCURONIUM BROMIDE 100 MG/10ML IV SOLN
INTRAVENOUS | Status: DC | PRN
  Administered 2012-06-17: 60 mg via INTRAVENOUS
  Administered 2012-06-17: 10 mg via INTRAVENOUS
  Administered 2012-06-17: 20 mg via INTRAVENOUS

## 2012-06-17 MED ORDER — HYDROMORPHONE HCL PF 1 MG/ML IJ SOLN
0.2500 mg | INTRAMUSCULAR | Status: DC | PRN
  Administered 2012-06-17 (×4): 0.5 mg via INTRAVENOUS

## 2012-06-17 MED ORDER — MIDAZOLAM HCL 5 MG/5ML IJ SOLN
INTRAMUSCULAR | Status: DC | PRN
  Administered 2012-06-17: 2 mg via INTRAVENOUS

## 2012-06-17 MED ORDER — VERAPAMIL HCL ER 240 MG PO TBCR
240.0000 mg | EXTENDED_RELEASE_TABLET | Freq: Every day | ORAL | Status: DC
  Administered 2012-06-18: 240 mg via ORAL
  Filled 2012-06-17 (×2): qty 1

## 2012-06-17 MED ORDER — ACETAMINOPHEN 325 MG PO TABS: 650.0000 mg | ORAL_TABLET | ORAL | Status: DC | PRN

## 2012-06-17 MED ORDER — FENOFIBRATE 160 MG PO TABS
160.0000 mg | ORAL_TABLET | Freq: Every day | ORAL | Status: DC
  Administered 2012-06-18: 160 mg via ORAL
  Filled 2012-06-17 (×2): qty 1

## 2012-06-17 MED ORDER — PROMETHAZINE HCL 25 MG/ML IJ SOLN: 6.2500 mg | INTRAMUSCULAR | Status: DC | PRN

## 2012-06-17 SURGICAL SUPPLY — 42 items
APL SKNCLS STERI-STRIP NONHPOA (GAUZE/BANDAGES/DRESSINGS)
BAG URINE DRAINAGE (UROLOGICAL SUPPLIES) ×3 IMPLANT
BANDAGE COBAN STERILE 2 (GAUZE/BANDAGES/DRESSINGS) IMPLANT
BENZOIN TINCTURE PRP APPL 2/3 (GAUZE/BANDAGES/DRESSINGS) ×2 IMPLANT
BLADE HEX COATED 2.75 (ELECTRODE) ×3 IMPLANT
BLADE SURG 15 STRL LF DISP TIS (BLADE) ×2 IMPLANT
BLADE SURG 15 STRL SS (BLADE) ×3
CANISTER SUCTION 2500CC (MISCELLANEOUS) ×2 IMPLANT
CATH FOLEY 2WAY SLVR  5CC 16FR (CATHETERS) ×1
CATH FOLEY 2WAY SLVR 5CC 16FR (CATHETERS) ×2 IMPLANT
CLOTH BEACON ORANGE TIMEOUT ST (SAFETY) ×3 IMPLANT
COVER MAYO STAND STRL (DRAPES) ×4 IMPLANT
COVER SURGICAL LIGHT HANDLE (MISCELLANEOUS) ×3 IMPLANT
DISSECTOR ROUND CHERRY 3/8 STR (MISCELLANEOUS) ×2 IMPLANT
DRAPE LAPAROTOMY T 102X78X121 (DRAPES) ×3 IMPLANT
DRSG TEGADERM 4X4.75 (GAUZE/BANDAGES/DRESSINGS) ×3 IMPLANT
GAUZE SPONGE 4X4 16PLY XRAY LF (GAUZE/BANDAGES/DRESSINGS) ×2 IMPLANT
GLOVE BIOGEL M STRL SZ7.5 (GLOVE) ×3 IMPLANT
GOWN STRL REIN XL XLG (GOWN DISPOSABLE) ×4 IMPLANT
KIT BASIN OR (CUSTOM PROCEDURE TRAY) ×3 IMPLANT
LUBRICANT JELLY ST 5GR 8946 (MISCELLANEOUS) ×8 IMPLANT
NS IRRIG 1000ML POUR BTL (IV SOLUTION) ×3 IMPLANT
PACK BASIC VI WITH GOWN DISP (CUSTOM PROCEDURE TRAY) ×2 IMPLANT
PACK GENERAL/GYN (CUSTOM PROCEDURE TRAY) ×3 IMPLANT
PLUG CATH AND CAP STER (CATHETERS) ×3 IMPLANT
RESERVOIR 75CC LOCKOUT BIOFLEX (Erectile Restoration) ×1 IMPLANT
RETRACTOR WILSON SYSTEM (INSTRUMENTS) ×1 IMPLANT
SOL PREP POV-IOD 16OZ 10% (MISCELLANEOUS) ×3 IMPLANT
SOL PREP PROV IODINE SCRUB 4OZ (MISCELLANEOUS) ×3 IMPLANT
SPONGE GAUZE 4X4 12PLY (GAUZE/BANDAGES/DRESSINGS) IMPLANT
SPONGE LAP 4X18 X RAY DECT (DISPOSABLE) IMPLANT
STRIP CLOSURE SKIN 1/2X4 (GAUZE/BANDAGES/DRESSINGS) ×2 IMPLANT
SUPPORT SCROTAL LG STRP (MISCELLANEOUS) ×3 IMPLANT
SUT MNCRL AB 3-0 PS2 18 (SUTURE) ×1 IMPLANT
SUT PDS AB 4-0 RB1 27 (SUTURE) ×1 IMPLANT
SUT VIC AB 2-0 SH 27 (SUTURE) ×12
SUT VIC AB 2-0 SH 27X BRD (SUTURE) IMPLANT
SUT VIC AB 2-0 UR5 27 (SUTURE) ×13 IMPLANT
SYR 20CC LL (SYRINGE) ×3 IMPLANT
SYR 50ML LL SCALE MARK (SYRINGE) ×6 IMPLANT
TOWEL OR 17X26 10 PK STRL BLUE (TOWEL DISPOSABLE) ×6 IMPLANT
WATER STERILE IRR 1500ML POUR (IV SOLUTION) ×3 IMPLANT

## 2012-06-17 NOTE — Interval H&P Note (Signed)
History and Physical Interval Note:  06/17/2012 7:28 AM  Bill Guerrero  has presented today for surgery, with the diagnosis of IMPOTENCE AND FAMILY PLANNING  The various methods of treatment have been discussed with the patient and family. After consideration of risks, benefits and other options for treatment, the patient has consented to  Procedure(s): THREE PIECE PENILE PROTHESIS INFLATABLE-COLOPLAST (PENILE SCROTAL APPROACH) (N/A) VASECTOMY (Bilateral) as a surgical intervention .  The patient's history has been reviewed, patient examined, no change in status, stable for surgery.  I have reviewed the patient's chart and labs. We discussed side effects of IPP and vasectomy, the proposed treatment, the likelihood of the patient achieving the goals of the procedure, and any potential problems that might occur during the procedure or recuperation. Questions were answered to the patient's satisfaction.  We had to reschedule the procedure as the 15 cm penoscrotal IPP was not available last time and I thought we might need it.    Bill Guerrero

## 2012-06-17 NOTE — Op Note (Signed)
Preop diagnosis: Organic impotence, desires sterility  Postoperative diagnosis: Organic impotence, desires sterility  Procedure: Bilateral vasectomy 3 piece Coloplast penile prosthesis  Surgeon: Mena Goes Anesthesia: [facility]  Description of procedure: After consent was obtained patient brought to the operating room. After adequate anesthesia he was prepped with a Hibiclens scrub and then chloraprep. He was draped in the usual sterile fashion. A timeout was performed to confirm the patient and procedure.  A 16 French Foley was placed and left to gravity drainage.  A longitudinal incision was made down the median raphae system. The penoscrotal junction. The superficial dartos fascia was dissected. The the darkest fascia was then incised from the right corpora over to the left corpora to expose the corpora.  A bilateral vasectomy was then performed by first isolating the left vas from the sheath. A 1 cm segment of vas was transected and removed. Both ends were cauterized. The vasal sheath was closed separately over both ends with a 4-0 PDS suture. An identical procedure was performed on the right. The vasal segments were sent to path for ID only.  Next 2-0 Vicryl stay sutures were placed in the left and right corpora and corporotomies were made. The metastases followed by the 8 through 12 proximal dilators were used to dilate distally and proximally. Irrigation through the right corpora crossed over to the left corpora appropriately and there was no irrigation seen per urethra.  Distal: 11 cm Proximal: 9 cm Prosthesis: 20 cm with no rear tips. Reservoir: 75 cc in the right space of Retzius.  Pressure on the bladder was made to ensure the bladder was emptied. The right external ring was approached from the scrotal incision and a stab incision was made down through the transversalis fascia. A finger was then put into the space of Retzius andprepared for the reservoir behind the pubic bone. There  were 75 cc reservoir was then advanced down into the space and filled. There was no back pressure and it sat in a normal position.   A 1000 drape was then pla and the drapesced over the scrotum and the drapes to provide another sterile surface for the prosthesis to sit on while being implanted.   The Furlow inserter and Mellody Dance needle was then used to insert the prosthesis to the right glans in the proximal portion of the prosthesis is placed in the proximal corpora. Similarly the Furlow inserter and Mellody Dance needle was used to place the prosthesis in the left corpora through the glans and then he proximal portion of the prosthesis was placed. The prosthesis was inflated noted to provide an excellent erection with the tips to the mid glans. There was a normal dorsal curvature.  The scrotal pocket was made and then a separate incision made through the back wall of the dartos fascia and the pump passed through this into the scrotal pocket. The darkest was) the tubing to keep it secure.  The corporotomies were then closed and I made one additional interrupted suture closure on the left corporotomy with the 4-0 PDS. The 2-0 Vicryl as were converted into horizontal mattress sutures.  Final connection was made from the prosthesis to the reservoir and and cycled normally with good cosmesis.  Throughout the procedure copious irrigation was used with the antibiotics to keep the wound and the prosthesis clear.  The prosthesis was left inflated to about 75%. The urine remained clear.  The deep dartos fascia was closed over the tubing and a transverse fashion with a running 2-0 Vicryl. The  skin was then closed with 4-0 subcuticular Monocryl and a longitudinal fashion.  Complications: None  Blood loss: Minimal  Drains: 16 French Foley  Specimens: None  Disposition: Patient stable to PACU.

## 2012-06-17 NOTE — Progress Notes (Signed)
Patient ID: Bill Guerrero, male   DOB: April 13, 1950, 62 y.o.   MRN: [id number]   Pt c/o penile pain. No abd pain. Tolerating regular diet.  Filed Vitals:   06/17/12 1130  BP: 130/68  Pulse: 59  Temp: 97.9 F (36.6 C)  Resp: 12    Intake/Output Summary (Last 24 hours) at 06/17/12 1613 Last data filed at 06/17/12 1100  Gross per 24 hour  Intake   1200 ml  Output    550 ml  Net    650 ml    NAD Abd - soft,NT GU - I deflated the IPP to take some of the pressure off.  Foley - urine clear yellow. SCD's in place  A - s/p IPP P - foley out in AM and home Percocet, Bactrim, colace F/u 2 weeks

## 2012-06-17 NOTE — Anesthesia Postprocedure Evaluation (Signed)
Anesthesia Post Note  Patient: Bill Guerrero  Procedure(s) Performed: Procedure(s) (LRB): THREE PIECE PENILE PROTHESIS INFLATABLE-COLOPLAST (PENILE SCROTAL APPROACH) (N/A) VASECTOMY (Bilateral)  Anesthesia type: General  Patient location: PACU  Post pain: Pain level controlled  Post assessment: Post-op Vital signs reviewed  Last Vitals:  Filed Vitals:   06/17/12 1130  BP: 130/68  Pulse: 59  Temp: 36.6 C  Resp: 12    Post vital signs: Reviewed  Level of consciousness: sedated  Complications: No apparent anesthesia complications

## 2012-06-17 NOTE — H&P (View-Only) (Signed)
H&P   History of Present Illness: 62 yo present with long history of ED for three-piece IPP. His wife is younger and he also desires vasectomy. He has been well. No dysuria or hematuria. No fever.    Past Medical History  Diagnosis Date  . Hypertension   . Hyperlipemia   . Bipolar 1 disorder   . Tremor     RT HAND  . Arthritis    Past Surgical History  Procedure Laterality Date  . Pilonidal cyst excision  1986  . Groin mass open biopsy      MASS REMOVED FROM GROIN  . Rotator cuff repair      RIGHT  . Joint replacement      TOTAL LEFT HIP  . Back surgery      CERVICAL AND LUMBAR  . Hemorroidectomy    . Hernia repair      BIL IN HERNIA    Home Medications:  Prescriptions prior to admission  Medication Sig Dispense Refill  . fenofibrate 160 MG tablet Take 160 mg by mouth daily before breakfast.      . lamoTRIgine (LAMICTAL) 200 MG tablet Take 200 mg by mouth at bedtime.      . OXcarbazepine (TRILEPTAL) 150 MG tablet Take 150 mg by mouth 2 (two) times daily.       . pravastatin (PRAVACHOL) 80 MG tablet Take 80 mg by mouth daily before breakfast.       . primidone (MYSOLINE) 50 MG tablet Take 100 mg by mouth 2 (two) times daily.      . QUEtiapine Fumarate (SEROQUEL XR) 150 MG TB24 Take 1 tablet by mouth at bedtime.       . verapamil (COVERA HS) 240 MG (CO) 24 hr tablet Take 240 mg by mouth daily before breakfast.       . Ascorbic Acid (VITAMIN C) 1000 MG tablet Take 1,000 mg by mouth daily.      . B Complex-C (B-COMPLEX WITH VITAMIN C) tablet Take 1 tablet by mouth daily.      . Cholecalciferol (VITAMIN D-3) 5000 UNITS TABS Take 1 tablet by mouth daily.      . Coenzyme Q10 200 MG capsule Take 200 mg by mouth 2 (two) times daily.      . DHEA 50 MG TABS Take 1 tablet by mouth 2 (two) times daily.      . fish oil-omega-3 fatty acids 1000 MG capsule Take 4 g by mouth 2 (two) times daily.       . magnesium oxide (MAG-OX) 400 MG tablet Take 400 mg by mouth daily.      .  Multiple Vitamin (MULTIVITAMIN) tablet Take 1 tablet by mouth daily.        . niacin 500 MG tablet Take 500 mg by mouth daily with breakfast.      . Potassium 99 MG TABS Take 1 tablet by mouth 2 (two) times daily.      . zolpidem (AMBIEN) 5 MG tablet Take 5 mg by mouth at bedtime as needed for sleep.       Allergies: No Known Allergies  History reviewed. No pertinent family history. Social History:  reports that he has never smoked. He does not have any smokeless tobacco history on file. He reports that he does not drink alcohol or use illicit drugs.  ROS: A complete review of systems was performed.  All systems are negative except for pertinent findings as noted. @ROS@   Physical Exam:  Vital signs in   last 24 hours: Temp:  [98.3 F (36.8 C)] 98.3 F (36.8 C) (04/29 0517) Pulse Rate:  [72] 72 (04/29 0517) Resp:  [18] 18 (04/29 0517) BP: (131)/(78) 131/78 mmHg (04/29 0517) SpO2:  [100 %] 100 % (04/29 0517) General:  Alert and oriented, No acute distress HEENT: Normocephalic, atraumatic Neck: No JVD or lymphadenopathy Cardiovascular: Regular rate and rhythm Lungs: Regular rate and effort Abdomen: Soft, nontender, nondistended, no abdominal masses Back: No CVA tenderness Extremities: No edema Neurologic: Grossly intact  Laboratory Data:  No results found for this or any previous visit (from the past 24 hour(s)). Recent Results (from the past 240 hour(s))  SURGICAL PCR SCREEN     Status: None   Collection Time    05/23/12 11:48 AM      Result Value Range Status   MRSA, PCR NEGATIVE  NEGATIVE Final   Staphylococcus aureus NEGATIVE  NEGATIVE Final   Comment:            The Xpert SA Assay (FDA     approved for NASAL specimens     in patients over 21 years of age),     is one component of     a comprehensive surveillance     program.  Test performance has     been validated by [facility] for patients greater     than or equal to 1 year old.     It is not intended      to diagnose infection nor to     guide or monitor treatment.   Creatinine: No results found for this basename: CREATININE,  in the last 168 hours  CBC, BMP, CXR stable  Impression/Assessment:  -impotence -vasectomy  Plan:  I discussed with the patient the nature, potential benefits, risks and alternatives to inflatable penile prosthesis and vasectomy, including side effects of the proposed treatment, the likelihood of the patient achieving the goals of the procedure, and any potential problems that might occur during the procedure or recuperation. All questions answered. Patient elects to proceed.   Bill Guerrero 05/31/2012, 7:30 AM   

## 2012-06-17 NOTE — Transfer of Care (Signed)
Immediate Anesthesia Transfer of Care Note  Patient: Bill Guerrero  Procedure(s) Performed: Procedure(s): THREE PIECE PENILE PROTHESIS INFLATABLE-COLOPLAST (PENILE SCROTAL APPROACH) (N/A) VASECTOMY (Bilateral)  Patient Location: PACU  Anesthesia Type:General  Level of Consciousness: awake, alert , oriented and patient cooperative  Airway & Oxygen Therapy: Patient Spontanous Breathing and Patient connected to face mask oxygen  Post-op Assessment: Report given to PACU RN, Post -op Vital signs reviewed and stable and Patient moving all extremities X 4  Post vital signs: Reviewed and stable  Complications: No apparent anesthesia complications

## 2012-06-18 MED ORDER — SULFAMETHOXAZOLE-TRIMETHOPRIM 400-80 MG PO TABS
1.0000 | ORAL_TABLET | Freq: Two times a day (BID) | ORAL | Status: DC
  Administered 2012-06-18: 1 via ORAL
  Filled 2012-06-18 (×2): qty 1

## 2012-06-18 MED ORDER — ONDANSETRON HCL 4 MG PO TABS: 4.0000 mg | ORAL_TABLET | ORAL | Status: DC | PRN

## 2012-06-18 MED ORDER — OXYCODONE-ACETAMINOPHEN 5-325 MG PO TABS
1.0000 | ORAL_TABLET | ORAL | Status: DC | PRN
  Administered 2012-06-18: 2 via ORAL
  Filled 2012-06-18: qty 2

## 2012-06-18 NOTE — Progress Notes (Signed)
Urology Progress Note  Subjective:     No acute urologic events overnight. Complains of penile pain which has improved w/ time and IV meds. Positive nasuea. Negative emesis this morning. Negative ambulation.  ROS: Negative: Chest pain or SOB.  Objective:  Patient Vitals for the past 24 hrs:  BP Temp Temp src Pulse Resp SpO2 Height Weight  06/18/12 0529 127/64 mmHg 97.8 F (36.6 C) Oral 57 16 100 % - -  06/17/12 2059 118/67 mmHg 98 F (36.7 C) Oral 60 13 99 % - -  06/17/12 1130 130/68 mmHg 97.9 F (36.6 C) Oral 59 12 100 % 6' 1.5" (1.867 m) 103.42 kg (228 lb)  06/17/12 1115 - - - - 12 - - -  06/17/12 1100 124/62 mmHg 97.6 F (36.4 C) - 64 13 100 % - -  06/17/12 1045 124/64 mmHg - - 63 15 100 % - -  06/17/12 1030 133/60 mmHg 98 F (36.7 C) - 62 13 100 % - -    Physical Exam: General:  No acute distress, awake Cardiovascular:    [x]   S1/S2 present, RRR  []   Irregularly irregular Chest:  CTA-B Abdomen:               []  Soft, appropriately TTP  [x]  Soft, NTTP  []  Soft, appropriately TTP, incision(s) clean/dry/intact  Genitourinary: Incisions C/D/I, IPP partially inflated. Foley:  In place. Draining clear yellow urine.    I/O last 3 completed shifts: In: 1200 [I.V.:1150; IV Piggyback:50] Out: 3100 [Urine:3050; Blood:50]  No results found for this basename: HGB, WBC, PLT,  in the last 72 hours  No results found for this basename: NA, K, CL, CO2, BUN, CREATININE, CALCIUM, MAGNESIUM, GFRNONAA, GFRAA,  in the last 72 hours   No results found for this basename: PT, INR, APTT,  in the last 72 hours   No components found with this basename: ABG,     Length of stay: 1 days.  Assessment: Impotence POD#1 Inflatable penile prosthesis   Plan: -D/c foley. -Ambulate. -D/c vicodin, Start percocet, try to avoid IV pain meds. -Ambulate. -D/c levaquin. Start bactrim. -Likely d/c home today.   Natalia Leatherwood, MD [phone]

## 2012-06-18 NOTE — Discharge Summary (Signed)
Physician Discharge Summary  Patient ID: WAYLON HERSHEY MRN: [id number] DOB/AGE: 1950/06/16 62 y.o.  Admit date: 06/17/2012 Discharge date: 06/18/2012  Admission Diagnoses: Impotence  Discharge Diagnoses:  Impotence  Discharged Condition: fair  Hospital Course:  Patient admitted following placement of inflatable penile prosthesis (IPP). He had pain which was relieved with partial deflation of his device, IV pain meds and then PO pain meds. His foley was discontinued and he was able to void. He had nausea that greatly improved overnight. He ambulated and was able to be discharged home.  Consults: None  Significant Diagnostic Studies: None  Treatments: surgery:  placement of inflatable penile prosthesis (IPP).  Discharge Exam: Blood pressure 127/64, pulse 57, temperature 97.8 F (36.6 C), temperature source Oral, resp. rate 16, height 6' 1.5" (1.867 m), weight 103.42 kg (228 lb), SpO2 100.00%. See PE from progress note on discharge date.  Disposition: 01-Home or Self Care     Medication List    TAKE these medications       B-complex with vitamin C tablet  Take 1 tablet by mouth daily.     Coenzyme Q10 200 MG capsule  Take 200 mg by mouth 2 (two) times daily.     DHEA 50 MG Tabs  Take 1 tablet by mouth 2 (two) times daily.     docusate sodium 100 MG capsule  Commonly known as:  COLACE  Take 1 capsule (100 mg total) by mouth 2 (two) times daily.     fenofibrate 160 MG tablet  Take 160 mg by mouth daily before breakfast.     fish oil-omega-3 fatty acids 1000 MG capsule  Take 4 g by mouth 2 (two) times daily.     lamoTRIgine 200 MG tablet  Commonly known as:  LAMICTAL  Take 200 mg by mouth at bedtime.     magnesium oxide 400 MG tablet  Commonly known as:  MAG-OX  Take 400 mg by mouth daily.     multivitamin tablet  Take 1 tablet by mouth daily.     niacin 500 MG tablet  Take 500 mg by mouth daily with breakfast.     ondansetron 4 MG tablet  Commonly  known as:  ZOFRAN  Take 1 tablet (4 mg total) by mouth every 4 (four) hours as needed for nausea.     oxyCODONE-acetaminophen 5-325 MG per tablet  Commonly known as:  ROXICET  Take 1 tablet by mouth every 4 (four) hours as needed for pain.     polyethylene glycol packet  Commonly known as:  MIRALAX / GLYCOLAX  Take 17 g by mouth at bedtime.     Potassium 99 MG Tabs  Take 1 tablet by mouth 2 (two) times daily.     pravastatin 80 MG tablet  Commonly known as:  PRAVACHOL  Take 80 mg by mouth daily before breakfast.     primidone 50 MG tablet  Commonly known as:  MYSOLINE  Take 100 mg by mouth 2 (two) times daily.     SEROQUEL XR 150 MG 24 hr tablet  Generic drug:  QUEtiapine Fumarate  Take 1 tablet by mouth at bedtime.     sulfamethoxazole-trimethoprim 800-160 MG per tablet  Commonly known as:  BACTRIM DS  Take 1 tablet by mouth 2 (two) times daily.     TRILEPTAL 150 MG tablet  Generic drug:  OXcarbazepine  Take 150 mg by mouth 2 (two) times daily.     verapamil 240 MG (CO) 24 hr tablet  Commonly  known as:  COVERA HS  Take 240 mg by mouth daily before breakfast.     vitamin C 1000 MG tablet  Take 1,000 mg by mouth 2 (two) times daily.     Vitamin D-3 5000 UNITS Tabs  Take 1 tablet by mouth daily.     zolpidem 5 MG tablet  Commonly known as:  AMBIEN  Take 5 mg by mouth at bedtime as needed for sleep.           Follow-up Information   Follow up with Mena Goes Lowella Petties, MD In 2 weeks.   Contact information:   [address] [place] [address] [phone]       Signed: Milford Cage 06/18/2012, 7:51 AM

## 2012-06-22 ENCOUNTER — Encounter (HOSPITAL_COMMUNITY): Payer: Self-pay | Admitting: Urology

## 2012-10-04 ENCOUNTER — Other Ambulatory Visit: Payer: Self-pay | Admitting: Urology

## 2012-10-05 ENCOUNTER — Encounter (HOSPITAL_COMMUNITY): Payer: Self-pay | Admitting: Pharmacy Technician

## 2012-10-06 ENCOUNTER — Encounter (HOSPITAL_COMMUNITY): Payer: Self-pay

## 2012-10-06 ENCOUNTER — Encounter (HOSPITAL_COMMUNITY)
Admission: RE | Admit: 2012-10-06 | Discharge: 2012-10-06 | Disposition: A | Discharge status: Home / Self Care | Payer: Medicare Other | Source: Ambulatory Visit | Attending: Urology | Admitting: Urology

## 2012-10-06 DIAGNOSIS — R110 Nausea: Secondary | ICD-10-CM | POA: Diagnosis present

## 2012-10-06 DIAGNOSIS — F411 Generalized anxiety disorder: Secondary | ICD-10-CM | POA: Diagnosis present

## 2012-10-06 DIAGNOSIS — IMO0002 Reserved for concepts with insufficient information to code with codable children: Secondary | ICD-10-CM | POA: Diagnosis not present

## 2012-10-06 DIAGNOSIS — I10 Essential (primary) hypertension: Secondary | ICD-10-CM | POA: Diagnosis not present

## 2012-10-06 DIAGNOSIS — Z96649 Presence of unspecified artificial hip joint: Secondary | ICD-10-CM | POA: Diagnosis not present

## 2012-10-06 DIAGNOSIS — N498 Inflammatory disorders of other specified male genital organs: Secondary | ICD-10-CM | POA: Diagnosis not present

## 2012-10-06 DIAGNOSIS — F319 Bipolar disorder, unspecified: Secondary | ICD-10-CM | POA: Diagnosis present

## 2012-10-06 DIAGNOSIS — N529 Male erectile dysfunction, unspecified: Secondary | ICD-10-CM | POA: Diagnosis present

## 2012-10-06 DIAGNOSIS — Z01812 Encounter for preprocedural laboratory examination: Secondary | ICD-10-CM | POA: Diagnosis not present

## 2012-10-06 DIAGNOSIS — N509 Disorder of male genital organs, unspecified: Secondary | ICD-10-CM | POA: Diagnosis not present

## 2012-10-06 DIAGNOSIS — E785 Hyperlipidemia, unspecified: Secondary | ICD-10-CM | POA: Diagnosis not present

## 2012-10-06 HISTORY — DX: Other specified health status: Z78.9

## 2012-10-06 HISTORY — DX: Infection and inflammatory reaction due to other internal prosthetic devices, implants and grafts, initial encounter: T85.79XA

## 2012-10-06 LAB — CBC
HCT: 39.6 % (ref 39.0–52.0)
Hemoglobin: 13.7 g/dL (ref 13.0–17.0)
MCH: 31.9 pg (ref 26.0–34.0)
MCHC: 34.6 g/dL (ref 30.0–36.0)
MCV: 92.1 fL (ref 78.0–100.0)
Platelets: 310 10*3/uL (ref 150–400)
RBC: 4.3 MIL/uL (ref 4.22–5.81)
RDW: 13 % (ref 11.5–15.5)
WBC: 5.9 10*3/uL (ref 4.0–10.5)

## 2012-10-06 LAB — BASIC METABOLIC PANEL
BUN: 16 mg/dL (ref 6–23)
CO2: 26 mEq/L (ref 19–32)
Calcium: 10.3 mg/dL (ref 8.4–10.5)
Chloride: 102 mEq/L (ref 96–112)
Creatinine, Ser: 0.99 mg/dL (ref 0.50–1.35)
GFR calc Af Amer: >90 mL/min (ref >90)
GFR calc non Af Amer: 86 mL/min — ABNORMAL LOW (ref >90)
Glucose, Bld: 102 mg/dL — ABNORMAL HIGH (ref 70–99)
Potassium: 4.4 mEq/L (ref 3.5–5.1)
Sodium: 137 mEq/L (ref 135–145)

## 2012-10-06 MED ORDER — GENTAMICIN SULFATE 40 MG/ML IJ SOLN
470.0000 mg | Freq: Once | INTRAVENOUS | Status: AC
  Administered 2012-10-07: 470 mg via INTRAVENOUS
  Filled 2012-10-06: qty 11.75

## 2012-10-06 NOTE — Pre-Procedure Instructions (Signed)
10-06-12 EKG/ CXR 4'14-Epic. Spoke with "Melissa" of Infection prevention- Pt. Does not meet criteria for any contact precautions other than universal. W. Keveon Amsler,RN

## 2012-10-06 NOTE — H&P (Signed)
History of Present Illness        Four days ago he noted scrotal swelling. He noted the pump was fixed. He had a refill for TMP-SMX and started it Wed after a temp of 101.2. He has had no temp since then.  He's is not having emesis. He feels well. He noted pain in scrotum and up into right groin. He still has some scrotal pain today but most of these symptoms have stabilized or improved some since starting Bactrim.     Past Medical History Problems  1. History of  Anxiety (Symptom) 300.00 2. History of  Arthritis V13.4 3. History of  Depression 311 4. History of  Hypercholesterolemia 272.0 5. History of  Hypertension 401.9 6. History of  Rhythm Disorder 427.9  Surgical History Problems  1. History of  Back Surgery 2. History of  Back Surgery 3. History of  Back Surgery 4. History of  Hemorrhoidectomy 5. History of  Hernia Repair 6. History of  Hernia Repair Bilateral 7. History of  Rotator Cuff Repair 8. History of  Spine Repair 9. History of  Surg Penis Insertion Of Penile Prosthesis 10. History of  Surgery Of Male Genitalia Vasectomy V25.2 11. History of  Total Hip Replacement  Current Meds 1. Co Q 10 CAPS; Therapy: (Recorded:25Feb2013) to 2. DHEA CAPS; Therapy: (Recorded:25Feb2013) to 3. Fenofibrate 160 MG Oral Tablet; Therapy: 10Apr2012 to 4. LamoTRIgine 150 MG Oral Tablet; Therapy: 15Oct2012 to 5. Multi-Vitamin Oral Tablet; Therapy: (Recorded:25Feb2013) to 6. Niacin TABS; Therapy: (Recorded:25Feb2013) to 7. OXcarbazepine 150 MG Oral Tablet; Therapy: (Recorded:25Feb2013) to 8. Potassium TABS; Therapy: (Recorded:25Feb2013) to 9. Pravastatin Sodium 80 MG Oral Tablet; Therapy: 10Apr2012 to 10. Primidone 50 MG Oral Tablet; Therapy: 06Feb2014 to 11. SEROquel XR 150 MG Oral Tablet Extended Release 24 Hour; Therapy: 05Oct2012 to 12. Sulfamethoxazole-TMP DS 800-160 MG Oral Tablet; TAKE 1 TABLET TWICE DAILY; Therapy:   11Jun2014 to (Evaluate:09Oct2014)  Requested for: 11Jun2014;  Last Rx:11Jun2014 13. Sulfamethoxazole-TMP DS 800-160 MG Oral Tablet; Take 1 tablet twice daily; Therapy:   11Jun2014 to (Evaluate:18Jun2014)  Requested for: 11Jun2014; Last Rx:11Jun2014 14. Verapamil HCl ER 240 MG Oral Tablet Extended Release; Therapy: 10Apr2012 to 15. Vitamin B Complex Oral Tablet; Therapy: (Recorded:25Feb2013) to 16. Vitamin C TABS; Therapy: (Recorded:25Feb2013) to 17. Vitamin D (Ergocalciferol) 50000 UNIT Oral Capsule; Therapy: 23Aug2012 to 18. Zolpidem Tartrate 10 MG Oral Tablet; Therapy: 05Jul2012 to  Allergies Medication  1. No Known Drug Allergies  Family History Problems  1. Paternal history of  Death In The Family Father age 48; doctor error? 2. Family history of  Death In The Family Father 3. Maternal history of  Death In The Family Mother age 34; leukemia 4. Family history of  Death In The Family Mother 5. Family history of  Family Health Status Number Of Children 2 sons; 1 daughter  Social History Problems  1. Alcohol Use 2. Marital History - Currently Married 3. Never A Smoker 4. Occupation: retired Nurse, children's  5. History of  Caffeine Use 6. History of  Tobacco Use  Vitals Vital Signs [Data Includes: Last 1 Day]  29Aug2014 04:03PM  Blood Pressure: 120 / 69 Temperature: 98.8 F Heart Rate: 76  Physical Exam Constitutional: Well nourished and well developed . No acute distress.  ENT:. The ears and nose are normal in appearance.  Neck: The appearance of the neck is normal and no neck mass is present.  Pulmonary: No respiratory distress and normal respiratory rhythm and effort.  Cardiovascular: Heart rate and rhythm are normal .  No peripheral edema.  Abdomen: The abdomen is soft and nontender. No masses are palpated. No CVA tenderness. No hernias are palpable. No hepatosplenomegaly noted.  Genitourinary: Examination of the penis demonstrates no discharge, no masses, no lesions and a normal meatus. The scrotum is without lesions. The right  epididymis is palpably normal and non-tender. The left epididymis is palpably normal and non-tender. The right testis is non-tender and without masses. The left testis is non-tender and without masses. Scrotum is swollen and erythematous. There is no necrosis or crepitus. Pump is fixed to skin and there is fluctuance/fluid around pump and tubing. There is nduration around right inguinal area. No erythema above scrotum.  Lymphatics: The femoral and inguinal nodes are not enlarged or tender.  Skin: Normal skin turgor, no visible rash and no visible skin lesions.  Neuro/Psych:. Mood and affect are appropriate.    Assessment Assessed  1. 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 Fortunately he is nontoxic. Vitals are normal.   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 Oxycodone-Acetaminophen 5-325 MG Oral Tablet; TAKE 1 TO 2 TABLETS EVERY 6 HOURS AS  NEEDED FOR PAIN; Therapy: 29Aug2014 to (Evaluate:02Sep2014); Last Rx:29Aug2014 2. Follow-up Schedule Surgery Office  Follow-up  Done: 29Aug2014  Discussion/Summary     Discussed IPP is likely infected. We discussed indetail nature, R/B of doing nothing/watchful waiting, IPP removal , attempted salvage with IPP removal, [place] washout and replacement. All questions answered and elects to proceed with attempted salvage. He will continue Bactrim.   He was instructed to notify me should he develop recurrent fever, malaise, any changes in the scrotum.     Signatures Electronically signed by : Jerilee Field, M.D.; Sep 30 2012  5:49PM

## 2012-10-06 NOTE — Patient Instructions (Addendum)
[address] Bill Guerrero [facility]  10/06/2012   Your procedure is scheduled on:9-5   -2014  Report to [facility] at    1100    AM .  Call this number if you have problems the morning of surgery: [phone]  Or Presurgical Testing [phone]([name])   Remember: Follow any bowel prep instructions per MD office.   Do not eat food:After Midnight.  May have clear liquids:up to 6 Hours before arrival. Nothing after :  Clear liquids include soda, tea, black coffee, apple or grape juice, broth.  Take these medicines the morning of surgery with A SIP OF WATER: Fenofibrate. Trileptal. Pravastatin. Primodine. Verapamil.   Do not wear jewelry, make-up or nail polish.  Do not wear lotions, powders, or perfumes. You may wear deodorant.  Do not shave 12 hours prior to first CHG shower(legs and under arms).(face and neck okay.)  Do not bring valuables to the hospital.  Contacts, dentures or bridgework,body piercing,  may not be worn into surgery.  Leave suitcase in the car. After surgery it may be brought to your room.  For patients admitted to the hospital, checkout time is 11:00 AM the day of discharge.   Patients discharged the day of surgery will not be allowed to drive home. Must have responsible person with you x 24 hours once discharged.  Name and phone number of your driver: yasir, kitner [phone] h  Special Instructions: CHG(Chlorhedine 4%-"Hibiclens","Betasept","Aplicare") Shower Use Special Wash: see special instructions.(avoid face and genitals)      Failure to follow these instructions may result in Cancellation of your surgery.   Patient signature_______________________________________________________

## 2012-10-07 ENCOUNTER — Inpatient Hospital Stay (HOSPITAL_COMMUNITY)
Admission: RE | Admit: 2012-10-07 | Discharge: 2012-10-09 | DRG: 709 | Disposition: A | Discharge status: Home / Self Care | Payer: Medicare Other | Source: Ambulatory Visit | Attending: Urology | Admitting: Urology

## 2012-10-07 ENCOUNTER — Encounter (HOSPITAL_COMMUNITY): Payer: Self-pay | Admitting: *Deleted

## 2012-10-07 ENCOUNTER — Ambulatory Visit (HOSPITAL_COMMUNITY): Payer: Medicare Other | Admitting: Anesthesiology

## 2012-10-07 ENCOUNTER — Encounter (HOSPITAL_COMMUNITY)
Admission: RE | Disposition: A | Discharge status: Home / Self Care | Payer: Self-pay | Source: Ambulatory Visit | Attending: Urology

## 2012-10-07 ENCOUNTER — Encounter (HOSPITAL_COMMUNITY): Payer: Self-pay | Admitting: Anesthesiology

## 2012-10-07 DIAGNOSIS — T83490A Other mechanical complication of penile (implanted) prosthesis, initial encounter: Secondary | ICD-10-CM

## 2012-10-07 DIAGNOSIS — F411 Generalized anxiety disorder: Secondary | ICD-10-CM | POA: Diagnosis present

## 2012-10-07 DIAGNOSIS — Z96649 Presence of unspecified artificial hip joint: Secondary | ICD-10-CM

## 2012-10-07 DIAGNOSIS — Y831 Surgical operation with implant of artificial internal device as the cause of abnormal reaction of the patient, or of later complication, without mention of misadventure at the time of the procedure: Secondary | ICD-10-CM | POA: Diagnosis present

## 2012-10-07 DIAGNOSIS — N498 Inflammatory disorders of other specified male genital organs: Principal | ICD-10-CM | POA: Diagnosis present

## 2012-10-07 DIAGNOSIS — E785 Hyperlipidemia, unspecified: Secondary | ICD-10-CM | POA: Diagnosis present

## 2012-10-07 DIAGNOSIS — R110 Nausea: Secondary | ICD-10-CM | POA: Diagnosis present

## 2012-10-07 DIAGNOSIS — F319 Bipolar disorder, unspecified: Secondary | ICD-10-CM | POA: Diagnosis present

## 2012-10-07 DIAGNOSIS — I10 Essential (primary) hypertension: Secondary | ICD-10-CM | POA: Diagnosis present

## 2012-10-07 DIAGNOSIS — IMO0002 Reserved for concepts with insufficient information to code with codable children: Secondary | ICD-10-CM | POA: Diagnosis present

## 2012-10-07 DIAGNOSIS — Z01812 Encounter for preprocedural laboratory examination: Secondary | ICD-10-CM

## 2012-10-07 DIAGNOSIS — N529 Male erectile dysfunction, unspecified: Secondary | ICD-10-CM | POA: Diagnosis present

## 2012-10-07 HISTORY — PX: PENILE PROSTHESIS IMPLANT: SHX240

## 2012-10-07 SURGERY — INSERTION, PENILE PROSTHESIS, INFLATABLE
Anesthesia: General | Site: Penis | Wound class: Dirty or Infected

## 2012-10-07 MED ORDER — HYDROMORPHONE HCL PF 1 MG/ML IJ SOLN
INTRAMUSCULAR | Status: AC
  Filled 2012-10-07: qty 1

## 2012-10-07 MED ORDER — DOCUSATE SODIUM 100 MG PO CAPS
100.0000 mg | ORAL_CAPSULE | Freq: Two times a day (BID) | ORAL | Status: DC
  Administered 2012-10-07 – 2012-10-09 (×4): 100 mg via ORAL
  Filled 2012-10-07 (×5): qty 1

## 2012-10-07 MED ORDER — MIDAZOLAM HCL 5 MG/5ML IJ SOLN
INTRAMUSCULAR | Status: DC | PRN
  Administered 2012-10-07 (×2): 1 mg via INTRAVENOUS

## 2012-10-07 MED ORDER — HYDROMORPHONE HCL PF 1 MG/ML IJ SOLN
0.5000 mg | INTRAMUSCULAR | Status: DC | PRN
Start: 2012-10-07 — End: 2012-10-09
  Administered 2012-10-07 – 2012-10-09 (×11): 1 mg via INTRAVENOUS
  Filled 2012-10-07 (×11): qty 1

## 2012-10-07 MED ORDER — VANCOMYCIN HCL 1000 MG IV SOLR
Freq: Once | Status: DC
  Filled 2012-10-07: qty 2500

## 2012-10-07 MED ORDER — QUETIAPINE FUMARATE ER 50 MG PO TB24
150.0000 mg | ORAL_TABLET | Freq: Every day | ORAL | Status: DC
  Administered 2012-10-07 – 2012-10-08 (×2): 150 mg via ORAL
  Filled 2012-10-07 (×4): qty 3

## 2012-10-07 MED ORDER — VANCOMYCIN HCL IN DEXTROSE 1-5 GM/200ML-% IV SOLN
INTRAVENOUS | Status: AC
  Filled 2012-10-07: qty 200

## 2012-10-07 MED ORDER — FENOFIBRATE 160 MG PO TABS
160.0000 mg | ORAL_TABLET | Freq: Every day | ORAL | Status: DC
  Administered 2012-10-08 – 2012-10-09 (×2): 160 mg via ORAL
  Filled 2012-10-07 (×3): qty 1

## 2012-10-07 MED ORDER — MAGNESIUM OXIDE 400 (241.3 MG) MG PO TABS
400.0000 mg | ORAL_TABLET | Freq: Every day | ORAL | Status: DC
  Administered 2012-10-07 – 2012-10-09 (×3): 400 mg via ORAL
  Filled 2012-10-07 (×3): qty 1

## 2012-10-07 MED ORDER — DEXAMETHASONE SODIUM PHOSPHATE 10 MG/ML IJ SOLN
INTRAMUSCULAR | Status: DC | PRN
  Administered 2012-10-07: 8 mg via INTRAVENOUS

## 2012-10-07 MED ORDER — ZOLPIDEM TARTRATE 5 MG PO TABS
5.0000 mg | ORAL_TABLET | Freq: Every evening | ORAL | Status: DC | PRN
Start: 2012-10-07 — End: 2012-10-09
  Administered 2012-10-08: 5 mg via ORAL

## 2012-10-07 MED ORDER — POTASSIUM GLUCONATE 595 (99 K) MG PO TABS
595.0000 mg | ORAL_TABLET | Freq: Two times a day (BID) | ORAL | Status: DC
  Administered 2012-10-07 – 2012-10-09 (×4): 595 mg via ORAL
  Filled 2012-10-07 (×5): qty 1

## 2012-10-07 MED ORDER — OXYCODONE HCL 5 MG PO TABS
5.0000 mg | ORAL_TABLET | ORAL | Status: DC | PRN
  Filled 2012-10-07: qty 1

## 2012-10-07 MED ORDER — PRIMIDONE 50 MG PO TABS
100.0000 mg | ORAL_TABLET | Freq: Two times a day (BID) | ORAL | Status: DC
  Administered 2012-10-07 – 2012-10-09 (×4): 100 mg via ORAL
  Filled 2012-10-07 (×5): qty 2

## 2012-10-07 MED ORDER — LACTATED RINGERS IV SOLN
INTRAVENOUS | Status: DC
  Administered 2012-10-07: 14:00:00 via INTRAVENOUS
  Administered 2012-10-07: 1000 mL via INTRAVENOUS

## 2012-10-07 MED ORDER — KETAMINE HCL 10 MG/ML IJ SOLN
INTRAMUSCULAR | Status: DC | PRN
  Administered 2012-10-07: 10 mg via INTRAVENOUS
  Administered 2012-10-07: 30 mg via INTRAVENOUS
  Administered 2012-10-07: 10 mg via INTRAVENOUS

## 2012-10-07 MED ORDER — DIPHENHYDRAMINE HCL 50 MG/ML IJ SOLN: 12.5000 mg | Freq: Four times a day (QID) | INTRAMUSCULAR | Status: DC | PRN

## 2012-10-07 MED ORDER — OXCARBAZEPINE 150 MG PO TABS
150.0000 mg | ORAL_TABLET | Freq: Two times a day (BID) | ORAL | Status: DC
  Administered 2012-10-07 – 2012-10-09 (×4): 150 mg via ORAL
  Filled 2012-10-07 (×5): qty 1

## 2012-10-07 MED ORDER — GLYCOPYRROLATE 0.2 MG/ML IJ SOLN
INTRAMUSCULAR | Status: DC | PRN
  Administered 2012-10-07: 0.6 mg via INTRAVENOUS

## 2012-10-07 MED ORDER — SODIUM CHLORIDE 0.9 % IR SOLN: Freq: Once | Status: DC

## 2012-10-07 MED ORDER — SUCCINYLCHOLINE CHLORIDE 20 MG/ML IJ SOLN
INTRAMUSCULAR | Status: DC | PRN
  Administered 2012-10-07: 100 mg via INTRAVENOUS

## 2012-10-07 MED ORDER — ONDANSETRON HCL 4 MG/2ML IJ SOLN
INTRAMUSCULAR | Status: DC | PRN
  Administered 2012-10-07: 4 mg via INTRAVENOUS

## 2012-10-07 MED ORDER — HYDROMORPHONE HCL PF 1 MG/ML IJ SOLN
0.2500 mg | INTRAMUSCULAR | Status: DC | PRN
  Administered 2012-10-07 (×4): 0.5 mg via INTRAVENOUS

## 2012-10-07 MED ORDER — VANCOMYCIN HCL IN DEXTROSE 1-5 GM/200ML-% IV SOLN
1000.0000 mg | INTRAVENOUS | Status: AC
  Administered 2012-10-07: 1000 mg via INTRAVENOUS

## 2012-10-07 MED ORDER — PROMETHAZINE HCL 25 MG/ML IJ SOLN: 6.2500 mg | INTRAMUSCULAR | Status: DC | PRN

## 2012-10-07 MED ORDER — SODIUM CHLORIDE 0.9 % IV SOLN
1250.0000 mg | Freq: Two times a day (BID) | INTRAVENOUS | Status: DC
  Administered 2012-10-07 – 2012-10-09 (×3): 1250 mg via INTRAVENOUS
  Filled 2012-10-07 (×5): qty 1250

## 2012-10-07 MED ORDER — ONDANSETRON HCL 4 MG/2ML IJ SOLN
4.0000 mg | INTRAMUSCULAR | Status: DC | PRN
  Administered 2012-10-07 – 2012-10-08 (×2): 4 mg via INTRAVENOUS
  Filled 2012-10-07 (×3): qty 2

## 2012-10-07 MED ORDER — LIDOCAINE HCL (CARDIAC) 20 MG/ML IV SOLN
INTRAVENOUS | Status: DC | PRN
  Administered 2012-10-07: 100 mg via INTRAVENOUS

## 2012-10-07 MED ORDER — NEOSTIGMINE METHYLSULFATE 1 MG/ML IJ SOLN
INTRAMUSCULAR | Status: DC | PRN
  Administered 2012-10-07: 4 mg via INTRAVENOUS

## 2012-10-07 MED ORDER — SIMVASTATIN 40 MG PO TABS
40.0000 mg | ORAL_TABLET | Freq: Every day | ORAL | Status: DC
  Filled 2012-10-07: qty 1

## 2012-10-07 MED ORDER — VERAPAMIL HCL ER 240 MG PO TBCR
240.0000 mg | EXTENDED_RELEASE_TABLET | Freq: Every day | ORAL | Status: DC
  Administered 2012-10-08 – 2012-10-09 (×2): 240 mg via ORAL
  Filled 2012-10-07 (×3): qty 1

## 2012-10-07 MED ORDER — SODIUM CHLORIDE 0.9 % IV SOLN
INTRAVENOUS | Status: DC
  Administered 2012-10-07 – 2012-10-08 (×2): via INTRAVENOUS
  Administered 2012-10-08: 100 mL/h via INTRAVENOUS

## 2012-10-07 MED ORDER — 0.9 % SODIUM CHLORIDE (POUR BTL) OPTIME
TOPICAL | Status: DC | PRN
  Administered 2012-10-07: 1000 mL

## 2012-10-07 MED ORDER — GENTAMICIN SULFATE 40 MG/ML IJ SOLN
INTRAMUSCULAR | Status: DC | PRN
  Administered 2012-10-07: 40 mg

## 2012-10-07 MED ORDER — DIPHENHYDRAMINE HCL 12.5 MG/5ML PO ELIX: 12.5000 mg | ORAL_SOLUTION | Freq: Four times a day (QID) | ORAL | Status: DC | PRN

## 2012-10-07 MED ORDER — HYOSCYAMINE SULFATE 0.125 MG SL SUBL
0.1250 mg | SUBLINGUAL_TABLET | SUBLINGUAL | Status: DC | PRN
  Filled 2012-10-07: qty 1

## 2012-10-07 MED ORDER — PROPOFOL 10 MG/ML IV BOLUS
INTRAVENOUS | Status: DC | PRN
  Administered 2012-10-07: 200 mg via INTRAVENOUS

## 2012-10-07 MED ORDER — NIACIN 500 MG PO TABS
500.0000 mg | ORAL_TABLET | Freq: Every day | ORAL | Status: DC
  Administered 2012-10-08 – 2012-10-09 (×2): 500 mg via ORAL
  Filled 2012-10-07 (×3): qty 1

## 2012-10-07 MED ORDER — FENTANYL CITRATE 0.05 MG/ML IJ SOLN
INTRAMUSCULAR | Status: DC | PRN
  Administered 2012-10-07 (×2): 50 ug via INTRAVENOUS
  Administered 2012-10-07: 150 ug via INTRAVENOUS
  Administered 2012-10-07 (×2): 50 ug via INTRAVENOUS

## 2012-10-07 MED ORDER — HYDROGEN PEROXIDE 3 % EX SOLN
CUTANEOUS | Status: DC | PRN
  Administered 2012-10-07: 1

## 2012-10-07 MED ORDER — SODIUM CHLORIDE 0.9 % IR SOLN
Freq: Once | Status: DC
  Filled 2012-10-07 (×3): qty 500

## 2012-10-07 MED ORDER — CISATRACURIUM BESYLATE (PF) 10 MG/5ML IV SOLN
INTRAVENOUS | Status: DC | PRN
  Administered 2012-10-07: 10 mg via INTRAVENOUS
  Administered 2012-10-07: 6 mg via INTRAVENOUS

## 2012-10-07 MED ORDER — SODIUM CHLORIDE 0.9 % IR SOLN
Status: DC | PRN
  Administered 2012-10-07: 16:00:00

## 2012-10-07 MED ORDER — ACETAMINOPHEN 325 MG PO TABS: 650.0000 mg | ORAL_TABLET | ORAL | Status: DC | PRN

## 2012-10-07 MED ORDER — ZOLPIDEM TARTRATE 5 MG PO TABS
5.0000 mg | ORAL_TABLET | Freq: Every evening | ORAL | Status: DC | PRN
  Filled 2012-10-07: qty 1

## 2012-10-07 MED ORDER — LAMOTRIGINE 100 MG PO TABS
200.0000 mg | ORAL_TABLET | Freq: Every day | ORAL | Status: DC
  Administered 2012-10-07 – 2012-10-08 (×2): 200 mg via ORAL
  Filled 2012-10-07 (×3): qty 2

## 2012-10-07 SURGICAL SUPPLY — 52 items
APL SKNCLS STERI-STRIP NONHPOA (GAUZE/BANDAGES/DRESSINGS)
BAG URINE DRAINAGE (UROLOGICAL SUPPLIES) ×2 IMPLANT
BANDAGE COBAN STERILE 2 (GAUZE/BANDAGES/DRESSINGS) IMPLANT
BENZOIN TINCTURE PRP APPL 2/3 (GAUZE/BANDAGES/DRESSINGS) ×1 IMPLANT
BLADE HEX COATED 2.75 (ELECTRODE) ×3 IMPLANT
CANISTER SUCTION 2500CC (MISCELLANEOUS) ×1 IMPLANT
CATH FOLEY 2WAY SLVR  5CC 16FR (CATHETERS) ×1
CATH FOLEY 2WAY SLVR 5CC 16FR (CATHETERS) ×1 IMPLANT
CATH ROBINSON RED A/P 16FR (CATHETERS) ×2 IMPLANT
CHLORAPREP W/TINT 26ML (MISCELLANEOUS) ×2 IMPLANT
CLOTH BEACON ORANGE TIMEOUT ST (SAFETY) ×2 IMPLANT
COVER MAYO STAND STRL (DRAPES) ×2 IMPLANT
COVER SURGICAL LIGHT HANDLE (MISCELLANEOUS) ×3 IMPLANT
DISSECTOR ROUND CHERRY 3/8 STR (MISCELLANEOUS) ×1 IMPLANT
DRAIN TLS ROUND 10FR (DRAIN) ×2 IMPLANT
DRAPE PED LAPAROTOMY (DRAPES) ×3 IMPLANT
DRAPE SURG 17X11 SM STRL (DRAPES) ×1 IMPLANT
DRSG TEGADERM 4X4.75 (GAUZE/BANDAGES/DRESSINGS) ×1 IMPLANT
GLOVE BIOGEL M STRL SZ7.5 (GLOVE) ×3 IMPLANT
GOWN STRL REIN XL XLG (GOWN DISPOSABLE) ×4 IMPLANT
HANDPIECE INTERPULSE COAX TIP (DISPOSABLE) ×2
KIT BASIN OR (CUSTOM PROCEDURE TRAY) ×3 IMPLANT
LUBRICANT JELLY ST 5GR 8946 (MISCELLANEOUS) ×5 IMPLANT
MANIFOLD NEPTUNE II (INSTRUMENTS) ×1 IMPLANT
NS IRRIG 1000ML POUR BTL (IV SOLUTION) ×1 IMPLANT
PACK GENERAL/GYN (CUSTOM PROCEDURE TRAY) ×3 IMPLANT
PEN SKIN MARKING BROAD (MISCELLANEOUS) ×1 IMPLANT
PENILE PROSTHESIS 11CM GENESIS (Erectile Restoration) ×2 IMPLANT
PLUG CATH AND CAP STER (CATHETERS) ×2 IMPLANT
PROSTHESIS PENILE 11CM GENESIS (Erectile Restoration) IMPLANT
RETRACTOR WILSON SYSTEM (INSTRUMENTS) ×2 IMPLANT
SCRUB TECHNI CARE 4 OZ NO DYE (MISCELLANEOUS) ×2 IMPLANT
SET HNDPC FAN SPRY TIP SCT (DISPOSABLE) IMPLANT
SOL PREP POV-IOD 16OZ 10% (MISCELLANEOUS) ×1 IMPLANT
STRIP CLOSURE SKIN 1/2X4 (GAUZE/BANDAGES/DRESSINGS) ×1 IMPLANT
SUPPORT SCROTAL LG STRP (MISCELLANEOUS) ×1 IMPLANT
SUT ETHILON 3 0 PS 1 (SUTURE) ×1 IMPLANT
SUT MNCRL AB 3-0 PS2 18 (SUTURE) ×1 IMPLANT
SUT PDS AB 2-0 CT2 27 (SUTURE) ×3 IMPLANT
SUT PDS AB 3-0 SH 27 (SUTURE) ×3 IMPLANT
SUT PDS AB 4-0 RB1 27 (SUTURE) ×2 IMPLANT
SUT VIC AB 2-0 SH 27 (SUTURE) ×2
SUT VIC AB 2-0 SH 27X BRD (SUTURE) ×3 IMPLANT
SWAB COLLECTION DEVICE MRSA (MISCELLANEOUS) ×1 IMPLANT
SYR 20CC LL (SYRINGE) ×2 IMPLANT
SYR 50ML LL SCALE MARK (SYRINGE) ×3 IMPLANT
SYR BULB IRRIGATION 50ML (SYRINGE) ×2 IMPLANT
SYR CONTROL 10ML LL (SYRINGE) ×1 IMPLANT
SYRINGE IRR TOOMEY STRL 70CC (SYRINGE) ×2 IMPLANT
TOWEL OR 17X26 10 PK STRL BLUE (TOWEL DISPOSABLE) ×4 IMPLANT
TUBE ANAEROBIC SPECIMEN COL (MISCELLANEOUS) ×1 IMPLANT
WATER STERILE IRR 1500ML POUR (IV SOLUTION) ×1 IMPLANT

## 2012-10-07 NOTE — Progress Notes (Signed)
ANTIBIOTIC CONSULT NOTE - INITIAL  Pharmacy Consult for Vancomycin and gentamicin Indication: Scrotal abscess, infected inflatable penile prosthesis  No Known Allergies  Patient Measurements:   Adjusted Body Weight: 87.5 kg  Vital Signs: Temp: 98.3 F (36.8 C) (09/05 1801) Temp src: Oral (09/05 1134) BP: 131/67 mmHg (09/05 1801) Pulse Rate: 73 (09/05 1801) Intake/Output from previous day:   Intake/Output from this shift: Total I/O In: 1900 [I.V.:1900] Out: 675 [Urine:650; Blood:25]  Labs:  Recent Labs  10/06/12 1130  WBC 5.9  HGB 13.7  PLT 310  CREATININE 0.99   The CrCl is unknown because both a height and weight (above a minimum accepted value) are required for this calculation. No results found for this basename: VANCOTROUGH, VANCOPEAK, VANCORANDOM, GENTTROUGH, GENTPEAK, GENTRANDOM, TOBRATROUGH, TOBRAPEAK, TOBRARND, AMIKACINPEAK, AMIKACINTROU, AMIKACIN,  in the last 72 hours   Microbiology: No results found for this or any previous visit (from the past 720 hour(s)).  Medical History: Past Medical History  Diagnosis Date  . Hypertension   . Hyperlipemia   . Bipolar 1 disorder   . Tremor     RT HAND  . Arthritis     hips replacement  . Multiple body piercings   . 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 -"pinkish tan""no odor"    Medications:  Anti-infectives   Start     Dose/Rate Route Frequency Ordered Stop   10/07/12 1606  polymyxin B 500,000 Units, bacitracin 50,000 Units in sodium chloride irrigation 0.9 % 500 mL irrigation  Status:  Discontinued       As needed 10/07/12 1606 10/07/12 1642   10/07/12 1432  gentamicin (GARAMYCIN) injection  Status:  Discontinued       As needed 10/07/12 1434 10/07/12 1642   10/07/12 1300  gentamicin (GARAMYCIN) 40 mg, vancomycin (VANCOCIN) 500 mg in sodium chloride irrigation 0.9 % 2,500 mL irrigation      Irrigation  Once 10/07/12 1156     10/07/12 1200  gentamicin  (GARAMYCIN) 40 mg, vancomycin (VANCOCIN) 500 mg in sodium chloride irrigation 0.9 % 2,500 mL irrigation      Irrigation  Once 10/07/12 1156     10/07/12 1114  vancomycin (VANCOCIN) IVPB 1000 mg/200 mL premix     1,000 mg 200 mL/hr over 60 Minutes Intravenous 60 min pre-op 10/07/12 1114 10/07/12 1325   10/07/12 0600  gentamicin (GARAMYCIN) 470 mg in dextrose 5 % 100 mL IVPB     470 mg 111.8 mL/hr over 60 Minutes Intravenous  Once 10/06/12 1832 10/07/12 1359     Assessment: 62yo s/p removal of infected inflatable penile prosthesis on 9/5. Pharmacy is asked to dose Vancomycin and Gentamicin post-op. Received Vanc 1g and Gent 470mg (5mg /kg) pre-op.  SCr wnl, CrCl ~ 6ml/min.  Goal of Therapy:  Vancomycin trough level 15-20 mcg/ml Gentamicin adjusted for renal function  Plan:   Vancomycin 1250mg  IV q12h.  Check a random Gentamicin level 10 hours after the pre-op dose and order further doses based on the [place] nomogram.  Measure Vanc trough at steady state.  Follow up renal fxn and culture results.  Charolotte Eke, PharmD, pager [phone]. 10/07/2012,6:18 PM.

## 2012-10-07 NOTE — Anesthesia Preprocedure Evaluation (Signed)
Anesthesia Evaluation  Patient identified by MRN, date of birth, ID band Patient awake    Reviewed: Allergy & Precautions, H&P , NPO status , Patient's Chart, lab work & pertinent test results  Airway Mallampati: II TM Distance: >3 FB Neck ROM: Full    Dental no notable dental hx.    Pulmonary neg pulmonary ROS,  breath sounds clear to auscultation  Pulmonary exam normal       Cardiovascular Exercise Tolerance: Good hypertension, Pt. on medications Rhythm:Regular Rate:Normal     Neuro/Psych PSYCHIATRIC DISORDERS Bipolar Disorder negative neurological ROS     GI/Hepatic negative GI ROS, Neg liver ROS,   Endo/Other  negative endocrine ROS  Renal/GU negative Renal ROS  negative genitourinary   Musculoskeletal negative musculoskeletal ROS (+)   Abdominal   Peds negative pediatric ROS (+)  Hematology negative hematology ROS (+)   Anesthesia Other Findings   Reproductive/Obstetrics negative OB ROS                           Anesthesia Physical Anesthesia Plan  ASA: II  Anesthesia Plan: General   Post-op Pain Management:    Induction: Intravenous  Airway Management Planned: Oral ETT  Additional Equipment:   Intra-op Plan:   Post-operative Plan: Extubation in OR  Informed Consent: I have reviewed the patients History and Physical, chart, labs and discussed the procedure including the risks, benefits and alternatives for the proposed anesthesia with the patient or authorized representative who has indicated his/her understanding and acceptance.   Dental advisory given  Plan Discussed with: CRNA  Anesthesia Plan Comments:         Anesthesia Quick Evaluation

## 2012-10-07 NOTE — Anesthesia Postprocedure Evaluation (Signed)
  Anesthesia Post-op Note  Patient: Bill Guerrero  Procedure(s) Performed: Procedure(s) (LRB): PENILE PROTHESIS REMOVAL AND REPLACEMENT INFLATABLE, PENILE SCROTAL APPROACH (N/A)  Patient Location: PACU  Anesthesia Type: General  Level of Consciousness: awake and alert   Airway and Oxygen Therapy: Patient Spontanous Breathing  Post-op Pain: mild  Post-op Assessment: Post-op Vital signs reviewed, Patient's Cardiovascular Status Stable, Respiratory Function Stable, Patent Airway and No signs of Nausea or vomiting  Last Vitals:  Filed Vitals:   10/07/12 1801  BP: 131/67  Pulse: 73  Temp: 36.8 C  Resp: 12    Post-op Vital Signs: stable   Complications: No apparent anesthesia complications

## 2012-10-07 NOTE — Progress Notes (Signed)
Received pt from PACU post  Penile prosthesis removal and replacement inflatable, penile scrotal approach. Alert and oriented, VSS. Pt resting at this time, will continue to monitor.

## 2012-10-07 NOTE — Preoperative (Signed)
Beta Blockers   Reason not to administer Beta Blockers:Not Applicable 

## 2012-10-07 NOTE — Op Note (Signed)
Preoperative diagnosis: Scrotal abscess, infected inflatable penile prosthesis Postoperative diagnosis: Same  Procedure: Scrotal exploration with removal of multicomponent penile prosthesis Wound irrigation Placement of malleable prosthesis, Coloplast Genesis, 11 mm diameter, 20 cm  Surgeon: Dlynn Ranes Type of anesthesia: Gen.  Findings: Purulent pocket around the pump and scrotum and purulent drainage adherent to reservoir;  Corpora clean  Irrigation used:Equivalent of Vancomycin 1 g and gentamicin 80 g per 5 L; 50-50 hydrogen peroxide: Saline, 50-50 Betadine: Saline  Description of procedure: After consent was obtained patient brought to the operating room.After adequate anesthesia the external genitalia and suprapubic area were scrubbed with a 10 minute Betadine scrub and painted with ChloraPrep. He was then draped in the usual sterile fashion.A 16 French Foley was placed and left gravity drainage. A Longitudinal scrotal incision was opened revealing purulent drainage around the pump. Aerobic and anaerobic cultures were obtained.The pump was delivered into the wound and the incision opened further. The periodic cavity was then copiously irrigated with Vancomycin/gentamicin, peroxide and Betadine Irrigation.The pump tubing was then traced each corpora corporotomies were made.The urethra was identified and preserved. The cylinders were then delivered after they were deflated. The tubing to the reservoir was clamped and cut releasing the pump and cylinders off the field. Next the reservoir tubing was traced back to the external ring and a finger was inserted into the space of Retzius and the reservoir delivered intact. It had some purulent drainage on it. A 16 French red rubber catheter was inserted into the space of Retzius through the external ring and the space was copiously irrigated with vancomycin/gentamicin, peroxide, Betadine followed by the Betadine, peroxide and vancomycin gentamicin  irrigations.  we got equal return of irrigation.   Next attention was turned to the corpora and a red rubber was placed in the proximal right corpora and another red rubber in the proximal left corpora. Irrigations were then repeated two each vancomycin/gentamicin, peroxide, Betadine. Then the red rubber catheters were inserted in the distal corpora and irrigations repeated in the same order. Of note there was good crossover of irrigation from one corpora to the other and there was no leakage of irrigation per urethra. Also the urine remained clear throughout the entire case.   Next the Pulsavac was used to irrigate the area using vancomycin/gentamicin. The scrotal wound was copiously irrigated, and the corpora were irrigated, the Pulsavac was advanced up to the external ring and this area irrigated. A total of 1500 cc was irrigated.  Next the corpora washed again proximal and distal using a reverse order of irrigations, two each, Betadine, peroxide, vancomycin and gentamicin.  There was some granulation tissue in the scrotum and the scrotal dartos tissues were very rough and inflamed. Therefore I decided it was best to place a malleable prosthesis. Both the proximal and distal and left and right corpora was remeasured and added up to 20 cm. Therefore the Genesis malleable was cut at 20 cm and a standard rear-tip attached. The corporotomies on both sides had to be slightly extended but each malleable was placed without difficulty. Each corporotomy was closed with interrupted 3-0 PDS sutures. The corpora were Tight and all prosthesis covered.   A drain was placed extending down from the reservoir area over the suprapubic region down into the scrotum and out the right hemiscrotum. This was secured to the skin with a nylon drain and connected to a Vacutainer once the skin was closed.  The dartos was closed with a running Vicryl 2-0 suture. The previous  pump pocket was left open internally and not sutured  against the skin. The skin was then run closed with a 3-0 Monocryl running horizontal mattress suture.  The patient was then cleaned up fluffs and a jockstrap were placed. He was awakened and taken to the recovery room in stable condition.  Complications: None  Blood loss: Minimal  Specimens: #1 aerobic and anaerobic Wound culture, #2 multicomponent inflatable penile prosthesis removed in its entirety  Drains: #1) 16 French Foley, #2) Blake drain to Vacutainer suction  Disposition: Patient stable to PACU

## 2012-10-07 NOTE — Transfer of Care (Signed)
Immediate Anesthesia Transfer of Care Note  Patient: Bill Guerrero  Procedure(s) Performed: Procedure(s): PENILE PROTHESIS REMOVAL AND REPLACEMENT INFLATABLE, PENILE SCROTAL APPROACH (N/A)  Patient Location: PACU  Anesthesia Type:General  Level of Consciousness: awake, alert  and oriented  Airway & Oxygen Therapy: Patient Spontanous Breathing and Patient connected to face mask oxygen  Post-op Assessment: Report given to PACU RN and Post -op Vital signs reviewed and stable  Post vital signs: Reviewed and stable  Complications: No apparent anesthesia complications

## 2012-10-07 NOTE — Interval H&P Note (Signed)
History and Physical Interval Note:  10/07/2012 1:05 PM  Bill Guerrero  has presented today for surgery, with the diagnosis of SCROTAL ABSCESS  The various methods of treatment have been discussed with the patient and family. After consideration of risks, benefits and other options for treatment, the patient has consented to  Procedure(s): PENILE PROTHESIS REMOVAL AND REPLACEMENT INFLATABLE, PENILE SCROTAL APPROACH (N/A) as a surgical intervention .  The patient's history has been reviewed, patient examined, no change in status, stable for surgery. He developed purulent drainage from the skin at the site where it was fixed to the pump over the past few days, therefore much of the scrotal swelling has dissipated. The scrotal skin has some mild erythema and a fistula in the midline. I discussed with the patient and his wife again the plan to remove the entire prosthesis and wash out the spaces where the implant components were located. Then we will determine if a new 3-piece can be placed (quoted about 15% failure rate with salvage). We discussed if there is active abscess and inflammation I will NOT place a new 3-piece prosthesis but we could consider placing a malleable prosthesis to preserve penile length. The final option would be to simply close the wound over a drain with no new implants (discussed risks of future penile scarring, difficult corporal dilation, implant placement). We discussed in detail the difference between having no implant, a malleable and a 3-piece prosthesis and how each option might effect sexual fxn and future surgeries. I have reviewed the patient's chart and labs.  Questions were answered to the patient's satisfaction.  He elects to proceed.    [name]

## 2012-10-07 NOTE — Anesthesia Procedure Notes (Signed)
Procedure Name: Intubation Date/Time: 10/07/2012 1:39 PM Performed by: Leroy Libman L Patient Re-evaluated:Patient Re-evaluated prior to inductionOxygen Delivery Method: Circle system utilized Preoxygenation: Pre-oxygenation with 100% oxygen Intubation Type: IV induction Ventilation: Mask ventilation without difficulty and Oral airway inserted - appropriate to patient size Laryngoscope Size: Miller and 3 Grade View: Grade I Tube type: Oral Tube size: 8.0 mm Number of attempts: 1 Airway Equipment and Method: Stylet Placement Confirmation: ETT inserted through vocal cords under direct vision,  positive ETCO2 and breath sounds checked- equal and bilateral Secured at: 21 cm Tube secured with: Tape Dental Injury: Teeth and Oropharynx as per pre-operative assessment

## 2012-10-08 LAB — GENTAMICIN LEVEL, RANDOM: Gentamicin Rm: 1.2 ug/mL

## 2012-10-08 MED ORDER — PRAVASTATIN SODIUM 40 MG PO TABS
80.0000 mg | ORAL_TABLET | Freq: Every day | ORAL | Status: DC
  Administered 2012-10-08: 80 mg via ORAL
  Filled 2012-10-08 (×4): qty 2

## 2012-10-08 MED ORDER — GENTAMICIN SULFATE 40 MG/ML IJ SOLN
7.0000 mg/kg | INTRAMUSCULAR | Status: DC
  Administered 2012-10-08: 620 mg via INTRAVENOUS
  Filled 2012-10-08 (×2): qty 15.52

## 2012-10-08 MED ORDER — PROMETHAZINE HCL 25 MG/ML IJ SOLN
12.5000 mg | INTRAMUSCULAR | Status: DC | PRN
  Administered 2012-10-08: 12.5 mg via INTRAVENOUS
  Filled 2012-10-08: qty 1

## 2012-10-08 MED ORDER — LORAZEPAM 0.5 MG PO TABS
0.5000 mg | ORAL_TABLET | Freq: Three times a day (TID) | ORAL | Status: DC | PRN
  Administered 2012-10-08: 0.5 mg via ORAL
  Filled 2012-10-08: qty 1

## 2012-10-08 NOTE — Progress Notes (Signed)
Patient ID: Bill Guerrero, male   DOB: 19-Aug-1950, 62 y.o.   MRN: [id number] 1 Day Post-Op  Subjective: Bill Guerrero had nausea and anxiety in the night but is better with phenergan and ativan.   He is having little pain.  He still has some drainage in the closed suction drain.  ROS: Negative except as above  Objective: Vital signs in last 24 hours: Temp:  [97.5 F (36.4 C)-98.8 F (37.1 C)] 97.5 F (36.4 C) (09/06 0541) Pulse Rate:  [60-73] 61 (09/06 0541) Resp:  [12-18] 16 (09/06 0541) BP: (119-146)/(59-74) 127/59 mmHg (09/06 0541) SpO2:  [99 %-100 %] 100 % (09/06 0541) Weight:  [98.4 kg (216 lb 14.9 oz)] 98.4 kg (216 lb 14.9 oz) (09/06 0541)  Intake/Output from previous day: 09/05 0701 - 09/06 0700 In: 3820 [P.O.:720; I.V.:3100] Out: 2130 [Urine:2075; Drains:30; Blood:25] Intake/Output this shift:    General appearance: alert and no distress Male genitalia: normal, Drain is draining serous fluid.  He has minimal edema and the wound is intact.   Lab Results:   Recent Labs  10/06/12 1130  WBC 5.9  HGB 13.7  HCT 39.6  PLT 310   BMET  Recent Labs  10/06/12 1130  NA 137  K 4.4  CL 102  CO2 26  GLUCOSE 102*  BUN 16  CREATININE 0.99  CALCIUM 10.3   PT/INR No results found for this basename: LABPROT, INR,  in the last 72 hours ABG No results found for this basename: PHART, PCO2, PO2, HCO3,  in the last 72 hours  Studies/Results: No results found.  Anti-infectives: Anti-infectives   Start     Dose/Rate Route Frequency Ordered Stop   10/08/12 1200  gentamicin (GARAMYCIN) 620 mg in dextrose 5 % 100 mL IVPB     7 mg/kg  88.7 kg (Adjusted) 115.5 mL/hr over 60 Minutes Intravenous Every 24 hours 10/08/12 0630     10/07/12 2359  vancomycin (VANCOCIN) 1,250 mg in sodium chloride 0.9 % 250 mL IVPB     1,250 mg 166.7 mL/hr over 90 Minutes Intravenous Every 12 hours 10/07/12 1822     10/07/12 1606  polymyxin B 500,000 Units, bacitracin 50,000 Units in sodium  chloride irrigation 0.9 % 500 mL irrigation  Status:  Discontinued       As needed 10/07/12 1606 10/07/12 1642   10/07/12 1432  gentamicin (GARAMYCIN) injection  Status:  Discontinued       As needed 10/07/12 1434 10/07/12 1642   10/07/12 1300  gentamicin (GARAMYCIN) 40 mg, vancomycin (VANCOCIN) 500 mg in sodium chloride irrigation 0.9 % 2,500 mL irrigation      Irrigation  Once 10/07/12 1156     10/07/12 1200  gentamicin (GARAMYCIN) 40 mg, vancomycin (VANCOCIN) 500 mg in sodium chloride irrigation 0.9 % 2,500 mL irrigation      Irrigation  Once 10/07/12 1156     10/07/12 1114  vancomycin (VANCOCIN) IVPB 1000 mg/200 mL premix     1,000 mg 200 mL/hr over 60 Minutes Intravenous 60 min pre-op 10/07/12 1114 10/07/12 1325   10/07/12 0600  gentamicin (GARAMYCIN) 470 mg in dextrose 5 % 100 mL IVPB     470 mg 111.8 mL/hr over 60 Minutes Intravenous  Once 10/06/12 1832 10/07/12 1359      Current Facility-Administered Medications  Medication Dose Route Frequency Provider Last Rate Last Dose  . 0.9 %  sodium chloride infusion   Intravenous Continuous [name], MD 100 mL/hr at 10/08/12 0537    . acetaminophen (  TYLENOL) tablet 650 mg  650 mg Oral Q4H PRN [name], MD      . diphenhydrAMINE (BENADRYL) injection 12.5 mg  12.5 mg Intravenous Q6H PRN [name], MD       Or  . diphenhydrAMINE (BENADRYL) 12.5 MG/5ML elixir 12.5 mg  12.5 mg Oral Q6H PRN [name], MD      . docusate sodium (COLACE) capsule 100 mg  100 mg Oral BID [name], MD   100 mg at 10/07/12 2104  . fenofibrate tablet 160 mg  160 mg Oral QAC breakfast [name], MD   160 mg at 10/08/12 0800  . gentamicin (GARAMYCIN) 40 mg, vancomycin (VANCOCIN) 500 mg in sodium chloride irrigation 0.9 % 2,500 mL irrigation   Irrigation Once [name], MD      . gentamicin (GARAMYCIN) 40 mg, vancomycin (VANCOCIN) 500 mg in sodium chloride irrigation 0.9 %  2,500 mL irrigation   Irrigation Once [name], MD      . gentamicin (GARAMYCIN) 620 mg in dextrose 5 % 100 mL IVPB  7 mg/kg (Adjusted) Intravenous Q24H [name], MD      . Hydrogen Peroxide 3 % 500 mL in sodium chloride irrigation 0.9 % 500 mL irrigation   Irrigation Once [name], MD      . HYDROmorphone (DILAUDID) injection 0.5-1 mg  0.5-1 mg Intravenous Q2H PRN [name], MD   1 mg at 10/08/12 0858  . hyoscyamine (LEVSIN SL) SL tablet 0.125 mg  0.125 mg Oral Q4H PRN [name], MD      . lamoTRIgine (LAMICTAL) tablet 200 mg  200 mg Oral QHS [name], MD   200 mg at 10/07/12 2104  . LORazepam (ATIVAN) tablet 0.5 mg  0.5 mg Oral TID PRN [name], MD   0.5 mg at 10/08/12 4782  . magnesium oxide (MAG-OX) tablet 400 mg  400 mg Oral Daily [name], MD   400 mg at 10/07/12 1940  . niacin tablet 500 mg  500 mg Oral Q breakfast [name], MD   500 mg at 10/08/12 0800  . ondansetron (ZOFRAN) injection 4 mg  4 mg Intravenous Q4H PRN [name], MD   4 mg at 10/08/12 0110  . OXcarbazepine (TRILEPTAL) tablet 150 mg  150 mg Oral BID [name], MD   150 mg at 10/07/12 2110  . oxyCODONE (Oxy IR/ROXICODONE) immediate release tablet 5 mg  5 mg Oral Q4H PRN [name], MD      . potassium gluconate tablet 595 mg  595 mg Oral BID [name], MD   595 mg at 10/07/12 2104  . primidone (MYSOLINE) tablet 100 mg  100 mg Oral BID [name], MD   100 mg at 10/07/12 2104  . promethazine (PHENERGAN) injection 12.5 mg  12.5 mg Intravenous Q4H PRN [name], MD   12.5 mg at 10/08/12 9562  . QUEtiapine (SEROQUEL XR) 24 hr tablet 150 mg  150 mg Oral QHS [name], MD   150 mg at 10/07/12 2104  . simvastatin (ZOCOR) tablet 40 mg  40 mg Oral q1800 [name], MD      . vancomycin [facility])  1,250 mg in sodium chloride 0.9 % 250 mL IVPB  1,250 mg Intravenous Q12H [name], MD   1,250 mg at 10/07/12 2325  . verapamil (CALAN-SR) CR tablet 240 mg  240  mg Oral QAC breakfast [name], MD   240 mg at 10/08/12 0800  . zolpidem (AMBIEN) tablet 5 mg  5 mg Oral QHS PRN [name], MD      . zolpidem [facility]) tablet 5 mg  5 mg Oral QHS PRN [name], MD        Assessment: s/p Procedure(s): PENILE PROTHESIS REMOVAL AND REPLACEMENT INFLATABLE, PENILE SCROTAL APPROACH  He is doing well now with resolution of the nausea and anxiety.   Plan: I will leave the foley at his request. Continue IV antibiotics pending wound culture. Continue closed suction drainage.       LOS: 1 day    Dreyah Montrose J 10/08/2012

## 2012-10-08 NOTE — Progress Notes (Signed)
Pt very nauseated. PRN IV zofran given. Pt has no relief from medication. On-call MD paged. No new orders written.

## 2012-10-08 NOTE — Progress Notes (Signed)
New orders written for pt's anxiety and unrelieved nausea. Orders initiated.

## 2012-10-08 NOTE — Progress Notes (Signed)
ANTIBIOTIC CONSULT NOTE - INITIAL  Pharmacy Consult for Gentamicin Indication: Scrotal abscess, infected inflatable penile prosthesis   No Known Allergies  Patient Measurements: Height: 6\' 2"  (188 cm) Weight: 216 lb 14.9 oz (98.4 kg) IBW/kg (Calculated) : 82.2 Adjusted Body Weight:   Vital Signs: Temp: 97.5 F (36.4 C) (09/06 0541) Temp src: Oral (09/06 0541) BP: 127/59 mmHg (09/06 0541) Pulse Rate: 61 (09/06 0541) Intake/Output from previous day: 09/05 0701 - 09/06 0700 In: 3820 [P.O.:720; I.V.:3100] Out: 2130 [Urine:2075; Drains:30; Blood:25] Intake/Output from this shift: Total I/O In: 1560 [P.O.:360; I.V.:1200] Out: 1455 [Urine:1425; Drains:30]  Labs:  Recent Labs  10/06/12 1130  WBC 5.9  HGB 13.7  PLT 310  CREATININE 0.99   Estimated Creatinine Clearance: 89.9 ml/min (by C-G formula based on Cr of 0.99).  Recent Labs  10/07/12 2330  GENTRANDOM 1.2     Microbiology: No results found for this or any previous visit (from the past 720 hour(s)).  Medical History: Past Medical History  Diagnosis Date  . Hypertension   . Hyperlipemia   . Bipolar 1 disorder   . Tremor     RT HAND  . Arthritis     hips replacement  . Multiple body piercings   . 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 -"pinkish tan""no odor"    Medications:  Anti-infectives   Start     Dose/Rate Route Frequency Ordered Stop   10/08/12 1200  gentamicin (GARAMYCIN) 620 mg in dextrose 5 % 100 mL IVPB     7 mg/kg  88.7 kg (Adjusted) 115.5 mL/hr over 60 Minutes Intravenous Every 24 hours 10/08/12 0630     10/07/12 2359  vancomycin (VANCOCIN) 1,250 mg in sodium chloride 0.9 % 250 mL IVPB     1,250 mg 166.7 mL/hr over 90 Minutes Intravenous Every 12 hours 10/07/12 1822     10/07/12 1606  polymyxin B 500,000 Units, bacitracin 50,000 Units in sodium chloride irrigation 0.9 % 500 mL irrigation  Status:  Discontinued       As needed 10/07/12 1606  10/07/12 1642   10/07/12 1432  gentamicin (GARAMYCIN) injection  Status:  Discontinued       As needed 10/07/12 1434 10/07/12 1642   10/07/12 1300  gentamicin (GARAMYCIN) 40 mg, vancomycin (VANCOCIN) 500 mg in sodium chloride irrigation 0.9 % 2,500 mL irrigation      Irrigation  Once 10/07/12 1156     10/07/12 1200  gentamicin (GARAMYCIN) 40 mg, vancomycin (VANCOCIN) 500 mg in sodium chloride irrigation 0.9 % 2,500 mL irrigation      Irrigation  Once 10/07/12 1156     10/07/12 1114  vancomycin (VANCOCIN) IVPB 1000 mg/200 mL premix     1,000 mg 200 mL/hr over 60 Minutes Intravenous 60 min pre-op 10/07/12 1114 10/07/12 1325   10/07/12 0600  gentamicin (GARAMYCIN) 470 mg in dextrose 5 % 100 mL IVPB     470 mg 111.8 mL/hr over 60 Minutes Intravenous  Once 10/06/12 1832 10/07/12 1359     Assessment: Patient already had 5mg /kg, and 10hr level within 7mg /kg q24hr dosing on [place] nomogram.  Goal of Therapy:  [place] nomogram dosing  Plan:  Measure antibiotic drug levels at steady state Follow up culture results 620mg  iv q24hr of gentamicin  Darlina Guys, Jacquenette Shone Crowford 10/08/2012,6:33 AM

## 2012-10-09 DIAGNOSIS — T83490A Other mechanical complication of penile (implanted) prosthesis, initial encounter: Secondary | ICD-10-CM

## 2012-10-09 MED ORDER — LORAZEPAM 0.5 MG PO TABS: 0.5000 mg | ORAL_TABLET | Freq: Three times a day (TID) | ORAL | Status: DC | PRN

## 2012-10-09 MED ORDER — SULFAMETHOXAZOLE-TMP DS 800-160 MG PO TABS: 1.0000 | ORAL_TABLET | Freq: Two times a day (BID) | ORAL | Status: DC

## 2012-10-09 MED ORDER — OXYCODONE-ACETAMINOPHEN 5-325 MG PO TABS: 1.0000 | ORAL_TABLET | ORAL | Status: DC | PRN

## 2012-10-09 NOTE — Discharge Summary (Signed)
Physician Discharge Summary  Patient ID: Bill Guerrero MRN: [id number] DOB/AGE: Jun 14, 1950 62 y.o.  Admit date: 10/07/2012 Discharge date: 10/09/2012  Admission Diagnoses:  Malfunction of penile prosthesis with infection  Discharge Diagnoses:  Principal Problem:   Malfunction of penile prosthesis with infection   Past Medical History  Diagnosis Date  . Hypertension   . Hyperlipemia   . Bipolar 1 disorder   . Tremor     RT HAND  . Arthritis     hips replacement  . Multiple body piercings   . 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 -"pinkish tan""no odor"    Surgeries: Procedure(s): PENILE PROTHESIS REMOVAL AND REPLACEMENT INFLATABLE, PENILE SCROTAL APPROACH on 10/07/2012   Consultants (if any):    Discharged Condition: Improved  Hospital Course: Bill Guerrero is an 62 y.o. male who was admitted 10/07/2012 with a diagnosis of Malfunction of penile prosthesis with infection and went to the operating room on 10/07/2012 and underwent the above named procedures.   He was left with a drain and foley on IV antibiotics.   His wound cultures were negative.   Foley and drain removed and he will be discharged on bactrim which he was on prior to the procedure.    He was given perioperative antibiotics:  Anti-infectives   Start     Dose/Rate Route Frequency Ordered Stop   10/09/12 0000  sulfamethoxazole-trimethoprim (BACTRIM DS) 800-160 MG per tablet     1 tablet Oral 2 times daily 10/09/12 1049     10/08/12 1200  gentamicin (GARAMYCIN) 620 mg in dextrose 5 % 100 mL IVPB     7 mg/kg  88.7 kg (Adjusted) 115.5 mL/hr over 60 Minutes Intravenous Every 24 hours 10/08/12 0630     10/07/12 2359  vancomycin (VANCOCIN) 1,250 mg in sodium chloride 0.9 % 250 mL IVPB     1,250 mg 166.7 mL/hr over 90 Minutes Intravenous Every 12 hours 10/07/12 1822     10/07/12 1606  polymyxin B 500,000 Units, bacitracin 50,000 Units in sodium chloride irrigation 0.9  % 500 mL irrigation  Status:  Discontinued       As needed 10/07/12 1606 10/07/12 1642   10/07/12 1432  gentamicin (GARAMYCIN) injection  Status:  Discontinued       As needed 10/07/12 1434 10/07/12 1642   10/07/12 1300  gentamicin (GARAMYCIN) 40 mg, vancomycin (VANCOCIN) 500 mg in sodium chloride irrigation 0.9 % 2,500 mL irrigation      Irrigation  Once 10/07/12 1156     10/07/12 1200  gentamicin (GARAMYCIN) 40 mg, vancomycin (VANCOCIN) 500 mg in sodium chloride irrigation 0.9 % 2,500 mL irrigation      Irrigation  Once 10/07/12 1156     10/07/12 1114  vancomycin (VANCOCIN) IVPB 1000 mg/200 mL premix     1,000 mg 200 mL/hr over 60 Minutes Intravenous 60 min pre-op 10/07/12 1114 10/07/12 1325   10/07/12 0600  gentamicin (GARAMYCIN) 470 mg in dextrose 5 % 100 mL IVPB     470 mg 111.8 mL/hr over 60 Minutes Intravenous  Once 10/06/12 1832 10/07/12 1359    .  He was given sequential compression devices for DVT prophylaxis.  He benefited maximally from the hospital stay and there were no complications.    Recent vital signs:  Filed Vitals:   10/09/12 0516  BP: 102/56  Pulse: 60  Temp: 97.5 F (36.4 C)  Resp: 16    Recent laboratory studies:  Lab  Results  Component Value Date   HGB 13.7 10/06/2012   HGB 13.7 05/23/2012   HGB 11.7* 08/17/2010   Lab Results  Component Value Date   WBC 5.9 10/06/2012   PLT 310 10/06/2012   No results found for this basename: INR   Lab Results  Component Value Date   NA 137 10/06/2012   K 4.4 10/06/2012   CL 102 10/06/2012   CO2 26 10/06/2012   BUN 16 10/06/2012   CREATININE 0.99 10/06/2012   GLUCOSE 102* 10/06/2012    Discharge Medications:     Medication List         B-complex with vitamin C tablet  Take 1 tablet by mouth daily.     Coenzyme Q10 200 MG capsule  Take 200 mg by mouth 2 (two) times daily.     DHEA 50 MG Tabs  Take 1 tablet by mouth 2 (two) times daily.     docusate sodium 100 MG capsule  Commonly known as:  COLACE  Take 1  capsule (100 mg total) by mouth 2 (two) times daily.     fenofibrate 160 MG tablet  Take 160 mg by mouth daily before breakfast.     fish oil-omega-3 fatty acids 1000 MG capsule  Take 4 g by mouth 2 (two) times daily.     lamoTRIgine 200 MG tablet  Commonly known as:  LAMICTAL  Take 200 mg by mouth at bedtime.     LORazepam 0.5 MG tablet  Commonly known as:  ATIVAN  Take 1 tablet (0.5 mg total) by mouth 3 (three) times daily as needed for anxiety.     magnesium oxide 400 MG tablet  Commonly known as:  MAG-OX  Take 400 mg by mouth daily.     multivitamin tablet  Take 1 tablet by mouth daily.     niacin 500 MG tablet  Take 500 mg by mouth daily with breakfast.     oxyCODONE-acetaminophen 5-325 MG per tablet  Commonly known as:  ROXICET  Take 1 tablet by mouth every 4 (four) hours as needed for pain.     Potassium 99 MG Tabs  Take 1 tablet by mouth 2 (two) times daily.     pravastatin 80 MG tablet  Commonly known as:  PRAVACHOL  Take 80 mg by mouth daily before breakfast.     primidone 50 MG tablet  Commonly known as:  MYSOLINE  Take 100 mg by mouth 2 (two) times daily.     SEROQUEL XR 150 MG 24 hr tablet  Generic drug:  QUEtiapine Fumarate  Take 1 tablet by mouth at bedtime.     sulfamethoxazole-trimethoprim 800-160 MG per tablet  Commonly known as:  BACTRIM DS  Take 1 tablet by mouth 2 (two) times daily.     TRILEPTAL 150 MG tablet  Generic drug:  OXcarbazepine  Take 150 mg by mouth 2 (two) times daily.     verapamil 240 MG (CO) 24 hr tablet  Commonly known as:  COVERA HS  Take 240 mg by mouth daily before breakfast.     vitamin C 1000 MG tablet  Take 1,000 mg by mouth 2 (two) times daily.     Vitamin D-3 5000 UNITS Tabs  Take 1 tablet by mouth daily.     zolpidem 5 MG tablet  Commonly known as:  AMBIEN  Take 5 mg by mouth at bedtime as needed for sleep.        Diagnostic Studies: No results found.  Disposition:  01-Home or Self Care       Discharge Orders   Future Orders Complete By Expires   Discontinue IV  As directed       Follow-up Information   Follow up with Eskridge, Lowella Petties, MD. (as scheduled. )    Specialty:  Urology   Contact information:   [address] [place] [address] [phone]        Signed: Anner Crete 10/09/2012, 10:51 AM

## 2012-10-10 LAB — WOUND CULTURE
Culture: NO GROWTH
Gram Stain: NONE SEEN

## 2012-10-14 LAB — ANAEROBIC CULTURE: Gram Stain: NONE SEEN

## 2012-10-25 ENCOUNTER — Encounter (HOSPITAL_COMMUNITY): Payer: Self-pay | Admitting: Urology

## 2013-08-11 DIAGNOSIS — F332 Major depressive disorder, recurrent severe without psychotic features: Secondary | ICD-10-CM | POA: Diagnosis not present

## 2013-08-28 DIAGNOSIS — E559 Vitamin D deficiency, unspecified: Secondary | ICD-10-CM | POA: Diagnosis not present

## 2013-08-28 DIAGNOSIS — E785 Hyperlipidemia, unspecified: Secondary | ICD-10-CM | POA: Diagnosis not present

## 2013-08-28 DIAGNOSIS — I10 Essential (primary) hypertension: Secondary | ICD-10-CM | POA: Diagnosis not present

## 2013-08-28 DIAGNOSIS — Z125 Encounter for screening for malignant neoplasm of prostate: Secondary | ICD-10-CM | POA: Diagnosis not present

## 2013-09-04 DIAGNOSIS — I10 Essential (primary) hypertension: Secondary | ICD-10-CM | POA: Diagnosis not present

## 2013-09-04 DIAGNOSIS — E785 Hyperlipidemia, unspecified: Secondary | ICD-10-CM | POA: Diagnosis not present

## 2013-09-04 DIAGNOSIS — M1990 Unspecified osteoarthritis, unspecified site: Secondary | ICD-10-CM | POA: Diagnosis not present

## 2013-09-04 DIAGNOSIS — Z0000 Encounter for general adult medical examination without abnormal findings: Secondary | ICD-10-CM | POA: Diagnosis not present

## 2013-09-04 DIAGNOSIS — F319 Bipolar disorder, unspecified: Secondary | ICD-10-CM | POA: Diagnosis not present

## 2013-09-04 DIAGNOSIS — M459 Ankylosing spondylitis of unspecified sites in spine: Secondary | ICD-10-CM | POA: Diagnosis not present

## 2013-09-07 DIAGNOSIS — Z1212 Encounter for screening for malignant neoplasm of rectum: Secondary | ICD-10-CM | POA: Diagnosis not present

## 2013-09-29 ENCOUNTER — Encounter: Payer: Self-pay | Admitting: Neurology

## 2013-09-29 ENCOUNTER — Ambulatory Visit (INDEPENDENT_AMBULATORY_CARE_PROVIDER_SITE_OTHER): Payer: Medicare Other | Admitting: Neurology

## 2013-09-29 VITALS — BP 110/68 | HR 60 | Resp 16 | Ht 74.0 in | Wt 203.0 lb

## 2013-09-29 DIAGNOSIS — G20 Parkinson's disease: Secondary | ICD-10-CM | POA: Diagnosis not present

## 2013-09-29 DIAGNOSIS — R259 Unspecified abnormal involuntary movements: Secondary | ICD-10-CM | POA: Diagnosis not present

## 2013-09-29 DIAGNOSIS — R251 Tremor, unspecified: Secondary | ICD-10-CM

## 2013-09-29 NOTE — Patient Instructions (Signed)
1. We are referring you to [facility] for a DaT scan. They will contact you directly to set up a time for this testing. If you do not hear from them they can be contacted at [phone].  

## 2013-09-29 NOTE — Progress Notes (Signed)
Note faxed to Dr Clovis Pu at [phone] with confirmation received.

## 2013-09-29 NOTE — Progress Notes (Signed)
Subjective:   Bill Guerrero was seen in consultation in the movement disorder clinic at the request of Dr. Clovis Pu.  His PCP is ARONSON,RICHARD A, MD.  The evaluation is for tremor.  The records that were made available to me were reviewed and I greatly appreciated the letter from Dr. Clovis Pu.  This patient is accompanied in the office by his spouse who supplements the history.   Pt has a long hx of tremor and a long hx of bipolar.  He has not been hospitalized since the 1970's for psychiatric illness.  He is now on lamictal 200 mg q day, trileptal 150 mg bid, seroquel XR which was recently increased to 300 q hs.    Pt reports that tremor started 2 years ago.  Pt states that it just started in the R hand and just with rest.  It has picked up with time and now involves the L hand as well.  It now interferes with writing and eating as well.  He thought that it was the lithium but lithium was d/c over a year ago without any change.  He doesn't believe that he was exposed to antipsychotics in the past, except the seroquel which he has been on for the last few years.  He states that rarely he will have "jumping" of the legs.  He says that voice has hypophonic speech.  He takes seroquel and ambien for sleep and doesn't move all night per pt.  Per wife, years ago, he would scream out in the sleep but he doesn't do that as much as in the past.  Pt also states that his walking is "stiffer" than in the past.  He has trouble getting out of a low couch.  No changes in smell, taste, swallowing.  No visual distortions/hallucinations.  Pt states that he can no longer write because he is slow.  Unknown if micrographia.  Pt is slow with ADL's but is able to do them.  No diplopia.  No syncope/lightheadness.     Pt is on currently on primidone 200 mg bid.  He has tried multiple other tremor meds including topamax (200 q day), artane 5 mg bid, cogentin 1.0 mg tid, and propranolol 60 mg tid.    Hasn't had neuroimaging in  over 25 years.    Outside reports reviewed: historical medical records and referral letter/letters.  No Known Allergies  Outpatient Encounter Prescriptions as of 09/29/2013  Medication Sig  . Ascorbic Acid (VITAMIN C) 1000 MG tablet Take 1,000 mg by mouth 2 (two) times daily.   . B Complex-C (B-COMPLEX WITH VITAMIN C) tablet Take 1 tablet by mouth daily.  . Cholecalciferol (VITAMIN D-3) 5000 UNITS TABS Take 1 tablet by mouth daily.  . Coenzyme Q10 200 MG capsule Take 200 mg by mouth 2 (two) times daily.  Marland Kitchen DHEA 50 MG TABS Take 1 tablet by mouth 2 (two) times daily.  Marland Kitchen docusate sodium (COLACE) 100 MG capsule Take 1 capsule (100 mg total) by mouth 2 (two) times daily.  . fenofibrate 160 MG tablet Take 160 mg by mouth daily before breakfast.  . fish oil-omega-3 fatty acids 1000 MG capsule Take 4 g by mouth 2 (two) times daily.   Marland Kitchen lamoTRIgine (LAMICTAL) 200 MG tablet Take 200 mg by mouth at bedtime.  Marland Kitchen LORazepam (ATIVAN) 0.5 MG tablet Take 1 tablet (0.5 mg total) by mouth 3 (three) times daily as needed for anxiety.  . magnesium oxide (MAG-OX) 400 MG tablet Take 400 mg  by mouth daily.  . Multiple Vitamin (MULTIVITAMIN) tablet Take 1 tablet by mouth daily.    . niacin 500 MG tablet Take 500 mg by mouth daily with breakfast.  . OXcarbazepine (TRILEPTAL) 150 MG tablet Take 150 mg by mouth 2 (two) times daily.   . Potassium 99 MG TABS Take 1 tablet by mouth 2 (two) times daily.  . pravastatin (PRAVACHOL) 80 MG tablet Take 80 mg by mouth daily before breakfast.   . primidone (MYSOLINE) 50 MG tablet Take 100 mg by mouth 2 (two) times daily.  . QUEtiapine Fumarate (SEROQUEL XR) 150 MG TB24 Take 2 tablets by mouth at bedtime.   . verapamil (COVERA HS) 240 MG (CO) 24 hr tablet Take 240 mg by mouth daily before breakfast.   . zolpidem (AMBIEN) 5 MG tablet Take 5 mg by mouth at bedtime as needed for sleep.  . [DISCONTINUED] oxyCODONE-acetaminophen (ROXICET) 5-325 MG per tablet Take 1 tablet by mouth  every 4 (four) hours as needed for pain.  . [DISCONTINUED] sulfamethoxazole-trimethoprim (BACTRIM DS) 800-160 MG per tablet Take 1 tablet by mouth 2 (two) times daily.    Past Medical History  Diagnosis Date  . Hypertension   . Hyperlipemia   . Bipolar 1 disorder   . Tremor     RT HAND  . Arthritis     hips replacement  . Multiple body piercings   . 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 Pilonidal cyst excision  1986  . Groin mass open biopsy      MASS REMOVED FROM GROIN  . Rotator cuff repair      RIGHT  . Joint replacement      TOTAL LEFT HIP  . Back surgery      CERVICAL AND LUMBAR  . Hemorroidectomy    . Hernia repair      BIL IN HERNIA  . Penile prosthesis implant N/A 06/17/2012    Procedure: THREE PIECE PENILE PROTHESIS INFLATABLE-COLOPLAST (PENILE SCROTAL APPROACH);  Surgeon: Fredricka Bonine, MD;  Location: [facility];  Service: Urology;  Laterality: N/A;  . Vasectomy Bilateral 06/17/2012    Procedure: VASECTOMY;  Surgeon: Fredricka Bonine, MD;  Location: [facility];  Service: Urology;  Laterality: Bilateral;  . Penile prosthesis implant N/A 10/07/2012    Procedure: PENILE PROTHESIS REMOVAL AND REPLACEMENT INFLATABLE, PENILE SCROTAL APPROACH;  Surgeon: Fredricka Bonine, MD;  Location: [facility];  Service: Urology;  Laterality: N/A;    History   Social History  . Marital Status: Married    Spouse Name: N/A    Number of Children: N/A  . Years of Education: N/A   Occupational History  . Not on file.   Social History Main Topics  . Smoking status: Never Smoker   . Smokeless tobacco: Not on file  . Alcohol Use: No     Comment: no alcohol in 2 years  . Drug Use: No  . Sexual Activity: Yes   Other Topics Concern  . Not on file   Social History Narrative  . No narrative on file    Family Status  Relation Status Death Age  .  Father Deceased     complications of surgery  . Mother Deceased     leukemia  . Sister Alive     healthy  . Sister Alive     healthy  . Son [employer]  healthy  . Son Alive     healthy  . Daughter Alive     healthy    Review of Systems A complete 10 system ROS was obtained and was negative apart from what is mentioned.   Objective:   VITALS:   Filed Vitals:   09/29/13 0957  BP: 110/68  Pulse: 60  Resp: 16  Height: 6\' 2"  (1.88 m)  Weight: 203 lb (92.08 kg)   Gen:  Appears stated age and in NAD. HEENT:  Normocephalic, atraumatic. The mucous membranes are moist. The superficial temporal arteries are without ropiness or tenderness. Cardiovascular: Regular rate and rhythm. Lungs: Clear to auscultation bilaterally. Neck: There are no carotid bruits noted bilaterally.  NEUROLOGICAL:  Orientation:  The patient is alert and oriented x 3.  Recent and remote memory are intact.  Attention span and concentration are normal.  Able to name objects and repeat without trouble.  Fund of knowledge is appropriate Cranial nerves: There is good facial symmetry.  There is facial hypomimia. The pupils are equal round and reactive to light bilaterally. Fundoscopic exam reveals clear disc margins bilaterally. Extraocular muscles are intact and visual fields are full to confrontational testing. Speech is fluent and clear.  He is mildly hypophonic.  No significant difficulty with the guttural sounds.  Soft palate rises symmetrically and there is no tongue deviation. Hearing is intact to conversational tone. Sensation: Sensation is intact to light touch and pinprick throughout (facial, trunk, extremities). Vibration is intact at the bilateral big toe. There is no extinction with double simultaneous stimulation. There is no sensory dermatomal level identified. Coordination:  The patient has no dysdiadichokinesia or dysmetria. Motor: Strength is 5/5 in the bilateral upper and lower extremities.  Shoulder  shrug is equal bilaterally.  There is no pronator drift.  There are no fasciculations noted. DTR's: Deep tendon reflexes are 2/4 at the bilateral biceps, triceps, brachioradialis, patella and achilles.  Plantar responses are downgoing bilaterally.  MOVEMENT EXAM:  Tone: There is increased tone in the right upper and right lower extremity, overall moderate.  Tone in the left upper and lower extremity is normal.  Abnormal movements: There is right upper extremity resting tremor that increases with distraction.  He does have some mild difficulty pouring water from one glass to another without spilling it, but it is really when the right hand is more in the rest position. Coordination:  There is significant decremation with RAM's, seen with virtually all forms of rapid alternating movements in the upper extremities, but the right is much more significant than the left.  Rapid alternating movements in the lower extremities were fairly good. Gait and Station: The patient has no significant difficulty arising out of a deep-seated chair without the use of the hands. The patient's stride length is normal with decreased arm swing bilaterally.  The patient has a negative pull test.         Assessment/Plan:   1.  Parkinsonism  -I. had a very long discussion with the patient and his wife.  I am not sure if this represents idiopathic Parkinson's disease or secondary parkinsonism from Seroquel.  Even though Seroquel is the preferable of the atypical antipsychotics and blocks D2 receptors less tightly, there still is some D2 receptor blockade associated with Seroquel and it can cause parkinsonism.  I told him that I would call Dr. Clovis Pu today, which I did after the left.  I had a nice conversation, and Dr. Clovis Pu felt that it would be unlikely that  the patient would do well off of Seroquel, as he has had pretty significant psychiatric illness/depression.  He, however, was willing if that was the only option.  I think  that we should proceed with the DaT scan and see what that looks like.  In theory, if this is post-synaptic and from Seroquel, then the DaT should be normal.  Pt would like to proceed with the DaT.  -For now, I would not change primidone although I am not sure that it is helping.  Told him not to d/c it "cold [place]" or it could cause seizure.  -I will see the patient back after the above is completed.  Answered all questions to the best of my ability.  Pt education provided.  Greater than 50% of 60 min visit in counseling and coordinating care.

## 2013-10-18 ENCOUNTER — Telehealth: Payer: Self-pay | Admitting: Neurology

## 2013-10-18 NOTE — Telephone Encounter (Signed)
Pt wife called and said that they could not pay for test and would like to talk to someone please call [phone]

## 2013-10-18 NOTE — Telephone Encounter (Signed)
Left message on machine for patient's wife to call back. To see if they mean the Dat Scan? Awaiting call back.

## 2013-10-20 NOTE — Telephone Encounter (Signed)
Tried to call Dr. Beryle Quant office multiple times to discuss but got busy signal.  Will you let pt know that we will have to call them Monday and remind me to try to contact Dr. Clovis Pu again Bill Guerrero

## 2013-10-20 NOTE — Telephone Encounter (Signed)
Spoke with patient's wife and she states their portion of the DaT scan cost would be approx $1100.00 and they can not afford right now. They do not want to switch off Seroquel because patient is doing well with his depression right now and they are getting financial help for medication. They want to know if there is a medication that can be started on the assumption this is Parkinson's Disease to treat the symptoms. Please advise.

## 2013-10-20 NOTE — Telephone Encounter (Signed)
Left another message on machine for patient to call back.

## 2013-10-23 NOTE — Telephone Encounter (Signed)
Left a message with Dr. Beryle Quant receptionist to call me back

## 2013-10-23 NOTE — Telephone Encounter (Signed)
Contact Dr Clovis Pu today.

## 2013-10-23 NOTE — Telephone Encounter (Signed)
Received call back from Dr. Clovis Pu.  Told him that I wasn't sure that starting PD meds was best option but turns out after talking to him that it may be only option.  They had already dropped seroquel back to 150 mg daily and didn't feel could go farther.  Did tell him that I wasn't sure how much good I would do with a DA agonist or carbidopa/levodopa if this was due to DA blockage, but our hands are tied right now.  Given patients age, may be best to start with mirapex instead of levodopa.  Jade, please start mirapex as follows:  :  0.125 mg - 1 tablet three times per day for a week, then 2 tablets three times per day for a week and then fill the 0.5 mg tablet and take that, 1 pill three times per day.  Talk to them about risk of compulsive behaviors and sleep attacks.  Make f/u in 8 weeks

## 2013-10-23 NOTE — Telephone Encounter (Signed)
Left message on machine for patient to call back to make him aware of message below.

## 2013-10-24 MED ORDER — PRAMIPEXOLE DIHYDROCHLORIDE 0.125 MG PO TABS: ORAL_TABLET | ORAL | Status: DC

## 2013-10-24 MED ORDER — PRAMIPEXOLE DIHYDROCHLORIDE 0.5 MG PO TABS
0.5000 mg | ORAL_TABLET | Freq: Three times a day (TID) | ORAL | Status: DC
Start: 2013-10-24 — End: 2014-03-21

## 2013-10-24 NOTE — Telephone Encounter (Signed)
Patient's wife made aware of message. RX sent to patient's pharmacy and follow up appt made.

## 2013-11-17 DIAGNOSIS — Z23 Encounter for immunization: Secondary | ICD-10-CM | POA: Diagnosis not present

## 2013-11-27 ENCOUNTER — Other Ambulatory Visit: Payer: Self-pay | Admitting: Neurology

## 2013-12-08 DIAGNOSIS — F332 Major depressive disorder, recurrent severe without psychotic features: Secondary | ICD-10-CM | POA: Diagnosis not present

## 2013-12-19 ENCOUNTER — Encounter: Payer: Self-pay | Admitting: Neurology

## 2013-12-19 ENCOUNTER — Other Ambulatory Visit: Payer: Self-pay | Admitting: *Deleted

## 2013-12-19 ENCOUNTER — Ambulatory Visit (INDEPENDENT_AMBULATORY_CARE_PROVIDER_SITE_OTHER): Payer: Medicare Other | Admitting: Neurology

## 2013-12-19 VITALS — BP 120/72 | HR 68 | Ht 73.5 in | Wt 206.0 lb

## 2013-12-19 DIAGNOSIS — G20 Parkinson's disease: Secondary | ICD-10-CM

## 2013-12-19 DIAGNOSIS — R251 Tremor, unspecified: Secondary | ICD-10-CM | POA: Diagnosis not present

## 2013-12-19 MED ORDER — PRAMIPEXOLE DIHYDROCHLORIDE 0.5 MG PO TABS: ORAL_TABLET | ORAL | Status: DC

## 2013-12-19 NOTE — Addendum Note (Signed)
Addended byAnnamaria Helling on: 12/19/2013 04:15 PM   Modules accepted: Orders

## 2013-12-19 NOTE — Progress Notes (Signed)
Subjective:   Bill Guerrero was seen in consultation in the movement disorder clinic at the request of Dr. Clovis Guerrero.  His PCP is Bill A, MD.  The evaluation is for tremor.  The records that were made available to me were reviewed and I greatly appreciated the letter from Dr. Clovis Guerrero.  This patient is accompanied in the office by his spouse who supplements the history.   Pt has Guerrero long hx of tremor and Guerrero long hx of bipolar.  He has not been hospitalized since the 1970's for psychiatric illness.  He is now on lamictal 200 mg q day, trileptal 150 mg bid, seroquel XR which was recently increased to 300 q hs.    Pt reports that tremor started 2 years ago.  Pt states that it just started in the R hand and just with rest.  It has picked up with time and now involves the L hand as well.  It now interferes with writing and eating as well.  He thought that it was the lithium but lithium was d/c over Guerrero year ago without any change.  He doesn't believe that he was exposed to antipsychotics in the past, except the seroquel which he has been on for the last few years.  He states that rarely he will have "jumping" of the legs.  He says that voice has hypophonic speech.  He takes seroquel and ambien for sleep and doesn't move all night per pt.  Per wife, years ago, he would scream out in the sleep but he doesn't do that as much as in the past.  Pt also states that his walking is "stiffer" than in the past.  He has trouble getting out of Guerrero low couch.  No changes in smell, taste, swallowing.  No visual distortions/hallucinations.  Pt states that he can no longer write because he is slow.  Unknown if micrographia.  Pt is slow with ADL's but is able to do them.  No diplopia.  No syncope/lightheadness.     Pt is on currently on primidone 200 mg bid.  He has tried multiple other tremor meds including topamax (200 q day), artane 5 mg bid, cogentin 1.0 mg tid, and propranolol 60 mg tid.    12/19/13 update:  Patient  returns today for follow-up.   Patient has history of parkinsonism.  Unfortunately, he was unable to afford the dat scan.  I talked to his psychiatrist several times.  They had tried to reduce his Seroquel, but ultimately determined that he just could not get off of the medication.  Pt states today that the dosage was actually just doubled again.  After Guerrero long discussion with the psychiatrist as well as the patient/wife, we ultimately made the decision to go ahead and try Mirapex even though we did not really know if this was secondary parkinsonism or idiopathic Parkinson's disease.  Mirapex was started on 10/18/13 but states that he actually didn't get it picked up until 9/29.  He states that he has periods of time now where he doesn't shake, but he still shakes every day.  He takes the Mirapex at 8 AM/4 PM and midnightNo SE.  No compulsive behaviors.  No falls.  Really would like to get Guerrero definitive dx and feels that is important to mental wellbeing.  Hasn't had neuroimaging in over 25 years.    Outside reports reviewed: historical medical records and referral letter/letters.  No Known Allergies  Outpatient Encounter Prescriptions as of 12/19/2013  Medication  Sig  . Ascorbic Acid (VITAMIN C) 1000 MG tablet Take 1,000 mg by mouth 2 (two) times daily.   . B Complex-C (B-COMPLEX WITH VITAMIN C) tablet Take 1 tablet by mouth daily.  . Cholecalciferol (VITAMIN D-3) 5000 UNITS TABS Take 1 tablet by mouth daily.  . Coenzyme Q10 200 MG capsule Take 200 mg by mouth 2 (two) times daily.  Marland Kitchen DHEA 50 MG TABS Take 1 tablet by mouth 2 (two) times daily.  . fenofibrate 160 MG tablet Take 160 mg by mouth daily before breakfast.  . fish oil-omega-3 fatty acids 1000 MG capsule Take 4 g by mouth 2 (two) times daily.   Marland Kitchen lamoTRIgine (LAMICTAL) 200 MG tablet Take 200 mg by mouth at bedtime.  Marland Kitchen LORazepam (ATIVAN) 0.5 MG tablet Take 0.5 mg by mouth as needed for anxiety.  . magnesium oxide (MAG-OX) 400 MG tablet Take  400 mg by mouth daily.  . niacin 500 MG tablet Take 500 mg by mouth daily with breakfast.  . OXcarbazepine (TRILEPTAL) 150 MG tablet Take 150 mg by mouth 2 (two) times daily.   . Potassium 99 MG TABS Take 1 tablet by mouth 2 (two) times daily.  . pramipexole (MIRAPEX) 0.5 MG tablet Take 1 tablet (0.5 mg total) by mouth 3 (three) times daily.  . pravastatin (PRAVACHOL) 80 MG tablet Take 80 mg by mouth daily before breakfast.   . QUEtiapine Fumarate (SEROQUEL XR) 150 MG TB24 Take 2 tablets by mouth at bedtime.   . verapamil (COVERA HS) 240 MG (CO) 24 hr tablet Take 240 mg by mouth daily before breakfast.   . zolpidem (AMBIEN) 5 MG tablet Take 5 mg by mouth at bedtime as needed for sleep.  . [DISCONTINUED] docusate sodium (COLACE) 100 MG capsule Take 1 capsule (100 mg total) by mouth 2 (two) times daily.  . [DISCONTINUED] Multiple Vitamin (MULTIVITAMIN) tablet Take 1 tablet by mouth daily.    . [DISCONTINUED] pramipexole (MIRAPEX) 0.125 MG tablet Take one tablet TID for one week, then increase to two tablets TID, then switch to 0.5 mg prescription  . [DISCONTINUED] primidone (MYSOLINE) 50 MG tablet Take 100 mg by mouth 2 (two) times daily.    Past Medical History  Diagnosis Date  . Hypertension   . Hyperlipemia   . Bipolar 1 disorder   . Tremor     RT HAND  . Arthritis     hips replacement  . Multiple body piercings   . 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 Pilonidal cyst excision  1986  . Groin mass open biopsy      MASS REMOVED FROM GROIN  . Rotator cuff repair      RIGHT  . Joint replacement      TOTAL LEFT HIP  . Back surgery      CERVICAL AND LUMBAR  . Hemorroidectomy    . Hernia repair      BIL IN HERNIA  . Penile prosthesis implant N/Guerrero 06/17/2012    Procedure: THREE PIECE PENILE PROTHESIS INFLATABLE-COLOPLAST (PENILE SCROTAL APPROACH);  Surgeon:  Fredricka Bonine, MD;  Location: [facility];  Service: Urology;  Laterality: N/Guerrero;  . Vasectomy Bilateral 06/17/2012    Procedure: VASECTOMY;  Surgeon: Fredricka Bonine, MD;  Location: [facility];  Service: Urology;  Laterality: Bilateral;  . Penile prosthesis implant N/Guerrero 10/07/2012    Procedure: PENILE PROTHESIS  REMOVAL AND REPLACEMENT INFLATABLE, PENILE SCROTAL APPROACH;  Surgeon: Fredricka Bonine, MD;  Location: [facility];  Service: Urology;  Laterality: N/Guerrero;    History   Social History  . Marital Status: Married    Spouse Name: N/Guerrero    Number of Children: N/Guerrero  . Years of Education: N/Guerrero   Occupational History  . Not on file.   Social History Main Topics  . Smoking status: Never Smoker   . Smokeless tobacco: Not on file  . Alcohol Use: No     Comment: no alcohol in 2 years  . Drug Use: No  . Sexual Activity: Yes   Other Topics Concern  . Not on file   Social History Narrative    Family Status  Relation Status Death Age  . Father Deceased     complications of surgery  . Mother Deceased     leukemia  . Sister Alive     healthy  . Sister Alive     healthy  . Son Alive     healthy  . Son Alive     healthy  . Daughter Alive     healthy    Review of Systems Guerrero complete 10 system ROS was obtained and was negative apart from what is mentioned.   Objective:   VITALS:   Filed Vitals:   12/19/13 1114  BP: 120/72  Pulse: 68  Height: 6' 1.5" (1.867 m)  Weight: 206 lb (93.441 kg)   Gen:  Appears stated age and in NAD. HEENT:  Normocephalic, atraumatic. The mucous membranes are moist. The superficial temporal arteries are without ropiness or tenderness. Cardiovascular: Regular rate and rhythm. Lungs: Clear to auscultation bilaterally. Neck: There are no carotid bruits noted bilaterally.  NEUROLOGICAL:  Orientation:  The patient is alert and oriented x 3.  Recent and remote memory are intact.  Attention span and concentration are normal.  Able to name  objects and repeat without trouble.  Fund of knowledge is appropriate Cranial nerves: There is good facial symmetry.  There is facial hypomimia.  Speech is fluent and clear.  He is mildly hypophonic.  No significant difficulty with the guttural sounds.  Soft palate rises symmetrically and there is no tongue deviation. Hearing is intact to conversational tone. Sensation: Sensation is intact to light touch throughout. Coordination:  The patient has no dysdiadichokinesia or dysmetria. Motor: Strength is 5/5 in the bilateral upper and lower extremities.  Shoulder shrug is equal bilaterally.  There is no pronator drift.  There are no fasciculations noted.   MOVEMENT EXAM:  Tone: There is increased tone in the right upper extremity, overall mild-moderate. Tone in the right lower extremity is now normal. Tone in the left upper and lower extremity is normal.  Abnormal movements: There is right upper extremity resting tremor that increases with distraction.  He does have some mild difficulty pouring water from one glass to another without spilling it, but it is really when the right hand is more in the rest position.  There is more rare left upper extremity resting tremor. Coordination:  There is significant decremation with RAM's, seen with virtually all forms of rapid alternating movements in the upper extremities, but the right is much more significant than the left.  Rapid alternating movements in the lower extremities were fairly good. Gait and Station: The patient has no significant difficulty arising out of Guerrero deep-seated chair without the use of the hands. The patient's stride length is normal with decreased arm swing on  the right only today.     Assessment/Plan:   1.  Parkinsonism  -I again had Guerrero long discussion with the patient, with greater than 50% of the 40 minute visit spent in counseling.  Once again, explained to the patient that I cannot tell if this is idiopathic Parkinson's disease or  parkinsonism from Seroquel.  He is somewhat frustrated by this, which I completely understand.  He cannot afford the DaT scan.  He is unable to get off of the Seroquel.  He does think that the Mirapex is helping, although not completely, so we decided to go ahead and raise that and move the dosages closer together.  He will take 1.0 mg at 8 AM, 1.0 mg at 1 PM and 0.5 mg at 6 PM.  Risks, benefits, side effects and alternative therapies were discussed.  The opportunity to ask questions was given and they were answered to the best of my ability.  The patient expressed understanding and willingness to follow the outlined treatment protocols.  -For now, I would not change primidone although I am not sure that it is helping.  Told him not to d/c it "cold [place]" or it could cause seizure.  -he is frustrated by hypophonic speech.  Talked about LSVT loud, but he does not think that he can afford it.  We talked about various apps for the smart phone and he is going to check those out.  -we talked about the importance of safe, cardiovascular exercise.  -I will see the patient back in the next few months, sooner should new neurologic issues arise.

## 2013-12-19 NOTE — Patient Instructions (Signed)
1.  Increase your pramipexole as follows:  0.5 mg - 2 tablets at 8am, 1 tablet at 1 pm, 1 at 5-6pm and do that for 1 week and then increase to 2 tablets at 8am, 2 tablets at 1 pm and 1 tablet at 5-6 pm thereafter 2.  Do some cardiovascular exercise 3.  Let me know if you want a referral for the voice therapy

## 2014-03-21 ENCOUNTER — Encounter: Payer: Self-pay | Admitting: Neurology

## 2014-03-21 ENCOUNTER — Ambulatory Visit (INDEPENDENT_AMBULATORY_CARE_PROVIDER_SITE_OTHER): Payer: Medicare Other | Admitting: Neurology

## 2014-03-21 VITALS — BP 130/66 | HR 84 | Ht 73.5 in | Wt 212.0 lb

## 2014-03-21 DIAGNOSIS — F458 Other somatoform disorders: Secondary | ICD-10-CM | POA: Diagnosis not present

## 2014-03-21 DIAGNOSIS — G20 Parkinson's disease: Secondary | ICD-10-CM | POA: Diagnosis not present

## 2014-03-21 DIAGNOSIS — F331 Major depressive disorder, recurrent, moderate: Secondary | ICD-10-CM

## 2014-03-21 DIAGNOSIS — R1319 Other dysphagia: Secondary | ICD-10-CM

## 2014-03-21 MED ORDER — PRAMIPEXOLE DIHYDROCHLORIDE 1 MG PO TABS: 1.0000 mg | ORAL_TABLET | Freq: Three times a day (TID) | ORAL | Status: DC

## 2014-03-21 NOTE — Patient Instructions (Signed)
We have placed an order you at [facility] for your modified barium swallow. They will call you directly with an appt. Please arrive 15 minutes prior and go to 1st floor radiology. If you do not hear from them please call [phone].

## 2014-03-21 NOTE — Progress Notes (Signed)
Subjective:   Bill Guerrero was seen in consultation in the movement disorder clinic at the request of Dr. Clovis Pu.  His PCP is ARONSON,RICHARD A, MD.  The evaluation is for tremor.  The records that were made available to me were reviewed and I greatly appreciated the letter from Dr. Clovis Pu.  This patient is accompanied in the office by his spouse who supplements the history.   Pt has a long hx of tremor and a long hx of bipolar.  He has not been hospitalized since the 1970's for psychiatric illness.  He is now on lamictal 200 mg q day, trileptal 150 mg bid, seroquel XR which was recently increased to 300 q hs.    Pt reports that tremor started 2 years ago.  Pt states that it just started in the R hand and just with rest.  It has picked up with time and now involves the L hand as well.  It now interferes with writing and eating as well.  He thought that it was the lithium but lithium was d/c over a year ago without any change.  He doesn't believe that he was exposed to antipsychotics in the past, except the seroquel which he has been on for the last few years.  He states that rarely he will have "jumping" of the legs.  He says that voice has hypophonic speech.  He takes seroquel and ambien for sleep and doesn't move all night per pt.  Per wife, years ago, he would scream out in the sleep but he doesn't do that as much as in the past.  Pt also states that his walking is "stiffer" than in the past.  He has trouble getting out of a low couch.  No changes in smell, taste, swallowing.  No visual distortions/hallucinations.  Pt states that he can no longer write because he is slow.  Unknown if micrographia.  Pt is slow with ADL's but is able to do them.  No diplopia.  No syncope/lightheadness.     Pt is on currently on primidone 200 mg bid.  He has tried multiple other tremor meds including topamax (200 q day), artane 5 mg bid, cogentin 1.0 mg tid, and propranolol 60 mg tid.    12/19/13 update:  Patient  returns today for follow-up.   Patient has history of parkinsonism.  Unfortunately, he was unable to afford the dat scan.  I talked to his psychiatrist several times.  They had tried to reduce his Seroquel, but ultimately determined that he just could not get off of the medication.  Pt states today that the dosage was actually just doubled again.  After a long discussion with the psychiatrist as well as the patient/wife, we ultimately made the decision to go ahead and try Mirapex even though we did not really know if this was secondary parkinsonism or idiopathic Parkinson's disease.  Mirapex was started on 10/18/13 but states that he actually didn't get it picked up until 9/29.  He states that he has periods of time now where he doesn't shake, but he still shakes every day.  He takes the Mirapex at 8 AM/4 PM and midnightNo SE.  No compulsive behaviors.  No falls.  Really would like to get a definitive dx and feels that is important to mental wellbeing.  03/21/14 update:  Patient returns today for follow-up.  He is accompanied by his wife who supplements the history.  Patient has history of parkinsonism.  Unfortunately, he was unable to afford  the dat scan.  Last visit, I increased the patient's Mirapex, so that he is taking 1 mg in the morning and in the afternoon and 0.5 mg in the evening.  He remains on primidone 200 mg twice a day, which he was on prior to seeing me at his first visit.  I have not changed that.  Overall, the patient has been doing fairly well and feels that the medication is helping.  He does not think that he does as well as evening, but the evening dosage of Mirapex is lower.  He does notice that tremor comes back quicker and the medication only seems to last about an hour after he takes it in the evening.  He has had no falls.  He is a little lightheaded.  He is not exercising.  He just has difficulty finding the motivation to do that.  No hallucinations.  He did have one episode of dysphagia  that scared his wife.    Hasn't had neuroimaging in over 25 years.    Outside reports reviewed: historical medical records and referral letter/letters.  No Known Allergies  Outpatient Encounter Prescriptions as of 03/21/2014  Medication Sig  . Ascorbic Acid (VITAMIN C) 1000 MG tablet Take 1,000 mg by mouth 2 (two) times daily.   . B Complex-C (B-COMPLEX WITH VITAMIN C) tablet Take 1 tablet by mouth daily.  . Cholecalciferol (VITAMIN D-3) 5000 UNITS TABS Take 1 tablet by mouth daily.  . Coenzyme Q10 200 MG capsule Take 200 mg by mouth 2 (two) times daily.  Marland Kitchen DHEA 50 MG TABS Take 1 tablet by mouth 2 (two) times daily.  . fenofibrate 160 MG tablet Take 160 mg by mouth daily before breakfast.  . fish oil-omega-3 fatty acids 1000 MG capsule Take 4 g by mouth 2 (two) times daily.   Marland Kitchen lamoTRIgine (LAMICTAL) 200 MG tablet Take 200 mg by mouth at bedtime.  Marland Kitchen LORazepam (ATIVAN) 0.5 MG tablet Take 0.5 mg by mouth as needed for anxiety.  . magnesium oxide (MAG-OX) 400 MG tablet Take 400 mg by mouth daily.  . niacin 500 MG tablet Take 500 mg by mouth daily with breakfast.  . OXcarbazepine (TRILEPTAL) 150 MG tablet Take 150 mg by mouth 2 (two) times daily.   . Potassium 99 MG TABS Take 1 tablet by mouth 2 (two) times daily.  . pramipexole (MIRAPEX) 0.5 MG tablet Take 2 tablets in the morning, 2 tablets in the afternoon, 1 in the evening  . pravastatin (PRAVACHOL) 80 MG tablet Take 80 mg by mouth daily before breakfast.   . QUEtiapine Fumarate (SEROQUEL XR) 150 MG TB24 Take 2 tablets by mouth at bedtime.   . verapamil (COVERA HS) 240 MG (CO) 24 hr tablet Take 240 mg by mouth daily before breakfast.   . zolpidem (AMBIEN) 5 MG tablet Take 5 mg by mouth at bedtime as needed for sleep.  . [DISCONTINUED] pramipexole (MIRAPEX) 0.5 MG tablet Take 1 tablet (0.5 mg total) by mouth 3 (three) times daily.    Past Medical History  Diagnosis Date  . Hypertension   . Hyperlipemia   . Bipolar 1 disorder   .  Tremor     RT HAND  . Arthritis     hips replacement  . Multiple body piercings   . 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 Pilonidal cyst excision  1986  . Groin mass open biopsy      MASS REMOVED FROM GROIN  . Rotator cuff repair      RIGHT  . Joint replacement      TOTAL LEFT HIP  . Back surgery      CERVICAL AND LUMBAR  . Hemorroidectomy    . Hernia repair      BIL IN HERNIA  . Penile prosthesis implant N/A 06/17/2012    Procedure: THREE PIECE PENILE PROTHESIS INFLATABLE-COLOPLAST (PENILE SCROTAL APPROACH);  Surgeon: Fredricka Bonine, MD;  Location: [facility];  Service: Urology;  Laterality: N/A;  . Vasectomy Bilateral 06/17/2012    Procedure: VASECTOMY;  Surgeon: Fredricka Bonine, MD;  Location: [facility];  Service: Urology;  Laterality: Bilateral;  . Penile prosthesis implant N/A 10/07/2012    Procedure: PENILE PROTHESIS REMOVAL AND REPLACEMENT INFLATABLE, PENILE SCROTAL APPROACH;  Surgeon: Fredricka Bonine, MD;  Location: [facility];  Service: Urology;  Laterality: N/A;    History   Social History  . Marital Status: Married    Spouse Name: N/A  . Number of Children: N/A  . Years of Education: N/A   Occupational History  . Not on file.   Social History Main Topics  . Smoking status: Never Smoker   . Smokeless tobacco: Not on file  . Alcohol Use: No     Comment: no alcohol in 2 years  . Drug Use: No  . Sexual Activity: Yes   Other Topics Concern  . Not on file   Social History Narrative    Family Status  Relation Status Death Age  . Father Deceased     complications of surgery  . Mother Deceased     leukemia  . Sister Alive     healthy  . Sister Alive     healthy  . Son Alive     healthy  . Son Alive     healthy  . Daughter Alive     healthy    Review of Systems A complete 10 system ROS was obtained and was  negative apart from what is mentioned.   Objective:   VITALS:   Filed Vitals:   03/21/14 1108  BP: 130/66  Pulse: 84  Height: 6' 1.5" (1.867 m)  Weight: 212 lb (96.163 kg)   Gen:  Appears stated age and in NAD. HEENT:  Normocephalic, atraumatic. The mucous membranes are moist. The superficial temporal arteries are without ropiness or tenderness. Cardiovascular: Regular rate and rhythm. Lungs: Clear to auscultation bilaterally. Neck: There are no carotid bruits noted bilaterally.  NEUROLOGICAL:  Orientation:  The patient is alert and oriented x 3.  Recent and remote memory are intact.  Attention span and concentration are normal.  Able to name objects and repeat without trouble.  Fund of knowledge is appropriate Cranial nerves: There is good facial symmetry.  There is facial hypomimia.  Speech is fluent and clear.  He is mildly hypophonic.  No significant difficulty with the guttural sounds.  Soft palate rises symmetrically and there is no tongue deviation. Hearing is intact to conversational tone. Sensation: Sensation is intact to light touch throughout. Coordination:  The patient has no dysdiadichokinesia or dysmetria. Motor: Strength is 5/5 in the bilateral upper and lower extremities.  Shoulder shrug is equal bilaterally.  There is no pronator drift.  There are no fasciculations noted.   MOVEMENT EXAM:  Tone: There is very mild increased tone in the right upper extremity. Tone in the right lower extremity is  now normal. Tone in the left upper and lower extremity is normal.  Abnormal movements: There is right upper extremity resting tremor that increases with distraction and is intermittent.  None seen on the left today. Coordination:  There is decremation with finger taps bilaterally. Gait and Station: The patient has no significant difficulty arising out of a deep-seated chair without the use of the hands. The patient's stride length is normal with decreased arm swing on the right  only today.     Assessment/Plan:   1.  Parkinsonism  -I again had a long discussion with the patient, with greater than 50% of the 40 minute visit spent in counseling.  Once again, explained to the patient that I cannot tell if this is idiopathic Parkinson's disease or parkinsonism from Seroquel.    He cannot afford the DaT scan.  He is unable to get off of the Seroquel.  Mirapex has helped and we will increase the dose to 1 mg 3 times per day.  He has not had any compulsive behaviors or other side effects.  Risks, benefits, side effects and alternative therapies were discussed.  The opportunity to ask questions was given and they were answered to the best of my ability.  The patient expressed understanding and willingness to follow the outlined treatment protocols.  -For now, I would not change primidone although I am not sure that it is helping.  Told him not to d/c it "cold [place]" or it could cause seizure.  -Talked to him extensively about the Parkinson's therapies, but he wants to hold on that for now.  He is going to consider it.  -we talked about the importance of safe, cardiovascular exercise.  Much greater than 50% of the almost 60 minute visit spent in counseling with the patient and his wife discussing the importance of exercise and safety.  We also discussed community resources.  He was invited to the March Parkinson's event. 2.  Dysphagia  -We will order a modified barium swallow 3.  Depression  -He is under the care of psychiatry. 4.  Follow-up in the next 3 months, sooner should new neurologic issues arise.

## 2014-03-27 ENCOUNTER — Other Ambulatory Visit (HOSPITAL_COMMUNITY): Payer: Self-pay | Admitting: Neurology

## 2014-03-27 ENCOUNTER — Telehealth: Payer: Self-pay | Admitting: Neurology

## 2014-03-27 DIAGNOSIS — R1314 Dysphagia, pharyngoesophageal phase: Secondary | ICD-10-CM

## 2014-03-27 NOTE — Telephone Encounter (Signed)
Patient made aware of appt date/time.  

## 2014-03-27 NOTE — Telephone Encounter (Signed)
Left message on machine for patient to call back.  To let him know that we have scheduled him at [facility] for his modified barium swallow on Friday 03/30/2014 at 1:00 pm. To arrive 15 minutes prior and go to 1st floor radiology. If he needs to reschedule for any reason he should call [phone]. Awaiting call back to make him aware.

## 2014-03-30 ENCOUNTER — Other Ambulatory Visit (HOSPITAL_COMMUNITY): Payer: Medicare Other

## 2014-03-30 ENCOUNTER — Ambulatory Visit (HOSPITAL_COMMUNITY): Payer: Medicare Other

## 2014-03-30 DIAGNOSIS — F332 Major depressive disorder, recurrent severe without psychotic features: Secondary | ICD-10-CM | POA: Diagnosis not present

## 2014-07-11 ENCOUNTER — Encounter: Payer: Self-pay | Admitting: Internal Medicine

## 2014-07-20 DIAGNOSIS — F332 Major depressive disorder, recurrent severe without psychotic features: Secondary | ICD-10-CM | POA: Diagnosis not present

## 2014-07-23 ENCOUNTER — Ambulatory Visit: Payer: Medicare Other | Admitting: Neurology

## 2014-07-27 ENCOUNTER — Ambulatory Visit (INDEPENDENT_AMBULATORY_CARE_PROVIDER_SITE_OTHER): Payer: Medicare Other | Admitting: Neurology

## 2014-07-27 ENCOUNTER — Encounter: Payer: Self-pay | Admitting: Neurology

## 2014-07-27 VITALS — BP 122/70 | HR 66 | Ht 73.0 in | Wt 224.0 lb

## 2014-07-27 DIAGNOSIS — F458 Other somatoform disorders: Secondary | ICD-10-CM | POA: Diagnosis not present

## 2014-07-27 DIAGNOSIS — R1319 Other dysphagia: Secondary | ICD-10-CM

## 2014-07-27 DIAGNOSIS — F331 Major depressive disorder, recurrent, moderate: Secondary | ICD-10-CM

## 2014-07-27 DIAGNOSIS — G20 Parkinson's disease: Secondary | ICD-10-CM | POA: Diagnosis not present

## 2014-07-27 NOTE — Progress Notes (Signed)
Subjective:   Bill Guerrero was seen in consultation in the movement disorder clinic at the request of Dr. Clovis Pu.  His PCP is Bill A, MD.  The evaluation is for tremor.  The records that were made available to me were reviewed and I greatly appreciated the letter from Dr. Clovis Pu.  This patient is accompanied in the office by his spouse who supplements the history.   Pt has Guerrero long hx of tremor and Guerrero long hx of bipolar.  He has not been hospitalized since the 1970's for psychiatric illness.  He is now on lamictal 200 mg q day, trileptal 150 mg bid, seroquel XR which was recently increased to 300 q hs.    Pt reports that tremor started 2 years ago.  Pt states that it just started in the R hand and just with rest.  It has picked up with time and now involves the L hand as well.  It now interferes with writing and eating as well.  He thought that it was the lithium but lithium was d/c over Guerrero year ago without any change.  He doesn't believe that he was exposed to antipsychotics in the past, except the seroquel which he has been on for the last few years.  He states that rarely he will have "jumping" of the legs.  He says that voice has hypophonic speech.  He takes seroquel and ambien for sleep and doesn't move all night per pt.  Per wife, years ago, he would scream out in the sleep but he doesn't do that as much as in the past.  Pt also states that his walking is "stiffer" than in the past.  He has trouble getting out of Guerrero low couch.  No changes in smell, taste, swallowing.  No visual distortions/hallucinations.  Pt states that he can no longer write because he is slow.  Unknown if micrographia.  Pt is slow with ADL's but is able to do them.  No diplopia.  No syncope/lightheadness.     Pt is on currently on primidone 200 mg bid.  He has tried multiple other tremor meds including topamax (200 q day), artane 5 mg bid, cogentin 1.0 mg tid, and propranolol 60 mg tid.    12/19/13 update:  Patient  returns today for follow-up.   Patient has history of parkinsonism.  Unfortunately, he was unable to afford the dat scan.  I talked to his psychiatrist several times.  They had tried to reduce his Seroquel, but ultimately determined that he just could not get off of the medication.  Pt states today that the dosage was actually just doubled again.  After Guerrero long discussion with the psychiatrist as well as the patient/wife, we ultimately made the decision to go ahead and try Mirapex even though we did not really know if this was secondary parkinsonism or idiopathic Parkinson's disease.  Mirapex was started on 10/18/13 but states that he actually didn't get it picked up until 9/29.  He states that he has periods of time now where he doesn't shake, but he still shakes every day.  He takes the Mirapex at 8 AM/4 PM and midnightNo SE.  No compulsive behaviors.  No falls.  Really would like to get Guerrero definitive dx and feels that is important to mental wellbeing.  03/21/14 update:  Patient returns today for follow-up.  He is accompanied by his wife who supplements the history.  Patient has history of parkinsonism.  Unfortunately, he was unable to afford  the dat scan.  Last visit, I increased the patient's Mirapex, so that he is taking 1 mg in the morning and in the afternoon and 0.5 mg in the evening.  He remains on primidone 200 mg twice Guerrero day, which he was on prior to seeing me at his first visit.  I have not changed that.  Overall, the patient has been doing fairly well and feels that the medication is helping.  He does not think that he does as well as evening, but the evening dosage of Mirapex is lower.  He does notice that tremor comes back quicker and the medication only seems to last about an hour after he takes it in the evening.  He has had no falls.  He is Guerrero little lightheaded.  He is not exercising.  He just has difficulty finding the motivation to do that.  No hallucinations.  He did have one episode of dysphagia  that scared his wife.    07/27/14 update:  Pt returns for f/u.  We increased his mirapex to 1.0 mg tid last visit.  He states that it seemed to help.  He seems to have better and worse days for no known reason.  Does seem to have periods where very tired (never when driving).  Is off of primidone.  He continues to c/o dysphagia.  I set him up last visit for Guerrero MBE but the patient cancelled it and did not r/s.  He doesn't remember why he cancelled it.  He has refused PD PT as well.  States that he isn't getting worse.  He is exercising 4 days Guerrero week on his bike and is really proud of himself.   He is still on seroquel 300 mg q hs.  C/o issues with constipation  Hasn't had neuroimaging in over 25 years.    Outside reports reviewed: historical medical records and referral letter/letters.  No Known Allergies  Outpatient Encounter Prescriptions as of 07/27/2014  Medication Sig  . Ascorbic Acid (VITAMIN C) 1000 MG tablet Take 1,000 mg by mouth 2 (two) times daily.   . B Complex-C (B-COMPLEX WITH VITAMIN C) tablet Take 1 tablet by mouth daily.  . Cholecalciferol (VITAMIN D-3) 5000 UNITS TABS Take 1 tablet by mouth daily.  . Coenzyme Q10 200 MG capsule Take 200 mg by mouth 2 (two) times daily.  Marland Kitchen DHEA 50 MG TABS Take 1 tablet by mouth 2 (two) times daily.  . fenofibrate 160 MG tablet Take 160 mg by mouth daily before breakfast.  . fish oil-omega-3 fatty acids 1000 MG capsule Take 4 g by mouth 2 (two) times daily.   Marland Kitchen lamoTRIgine (LAMICTAL) 200 MG tablet Take 200 mg by mouth at bedtime.  Marland Kitchen LORazepam (ATIVAN) 0.5 MG tablet Take 0.5 mg by mouth as needed for anxiety.  . magnesium oxide (MAG-OX) 400 MG tablet Take 400 mg by mouth daily.  . niacin 500 MG tablet Take 500 mg by mouth daily with breakfast.  . OXcarbazepine (TRILEPTAL) 150 MG tablet Take 150 mg by mouth 2 (two) times daily.   . Potassium 99 MG TABS Take 1 tablet by mouth 2 (two) times daily.  . pramipexole (MIRAPEX) 0.5 MG tablet Take 2 tablets  in the morning, 2 tablets in the afternoon, 1 in the evening (Patient taking differently: Take 1 mg by mouth 3 (three) times daily. 1 tablet am 1 tablet at noon 1 tablet pm)  . pramipexole (MIRAPEX) 1 MG tablet Take 1 tablet (1 mg total) by  mouth 3 (three) times daily.  . pravastatin (PRAVACHOL) 80 MG tablet Take 80 mg by mouth daily before breakfast.   . QUEtiapine Fumarate (SEROQUEL XR) 150 MG TB24 Take 2 tablets by mouth at bedtime.   . verapamil (COVERA HS) 240 MG (CO) 24 hr tablet Take 240 mg by mouth daily before breakfast.   . zolpidem (AMBIEN) 5 MG tablet Take 5 mg by mouth at bedtime as needed for sleep.   No facility-administered encounter medications on file as of 07/27/2014.    Past Medical History  Diagnosis Date  . Hypertension   . Hyperlipemia   . Bipolar 1 disorder   . Tremor     RT HAND  . Arthritis     hips replacement  . Multiple body piercings   . 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 Pilonidal cyst excision  1986  . Groin mass open biopsy      MASS REMOVED FROM GROIN  . Rotator cuff repair      RIGHT  . Joint replacement      TOTAL LEFT HIP  . Back surgery      CERVICAL AND LUMBAR  . Hemorroidectomy    . Hernia repair      BIL IN HERNIA  . Penile prosthesis implant N/Guerrero 06/17/2012    Procedure: THREE PIECE PENILE PROTHESIS INFLATABLE-COLOPLAST (PENILE SCROTAL APPROACH);  Surgeon: Fredricka Bonine, MD;  Location: [facility];  Service: Urology;  Laterality: N/Guerrero;  . Vasectomy Bilateral 06/17/2012    Procedure: VASECTOMY;  Surgeon: Fredricka Bonine, MD;  Location: [facility];  Service: Urology;  Laterality: Bilateral;  . Penile prosthesis implant N/Guerrero 10/07/2012    Procedure: PENILE PROTHESIS REMOVAL AND REPLACEMENT INFLATABLE, PENILE SCROTAL APPROACH;  Surgeon: Fredricka Bonine, MD;  Location: [facility];  Service: Urology;   Laterality: N/Guerrero;    History   Social History  . Marital Status: Married    Spouse Name: N/Guerrero  . Number of Children: N/Guerrero  . Years of Education: N/Guerrero   Occupational History  . Not on file.   Social History Main Topics  . Smoking status: Never Smoker   . Smokeless tobacco: Not on file  . Alcohol Use: No     Comment: no alcohol in 2 years  . Drug Use: No  . Sexual Activity: Yes   Other Topics Concern  . Not on file   Social History Narrative    Family Status  Relation Status Death Age  . Father Deceased     complications of surgery  . Mother Deceased     leukemia  . Sister Alive     healthy  . Sister Alive     healthy  . Son Alive     healthy  . Son Alive     healthy  . Daughter Alive     healthy    Review of Systems Guerrero complete 10 system ROS was obtained and was negative apart from what is mentioned.   Objective:   VITALS:   Filed Vitals:   07/27/14 1034  BP: 122/70  Pulse: 66  Height: 6\' 1"  (1.854 m)  Weight: 224 lb (101.606 kg)  SpO2: 98%   Wt Readings from Last 3 Encounters:  07/27/14 224 lb (101.606 kg)  03/21/14 212 lb (96.163 kg)  12/19/13 206 lb (93.441 kg)     Gen:  Appears  stated age and in NAD. HEENT:  Normocephalic, atraumatic. The mucous membranes are moist. The superficial temporal arteries are without ropiness or tenderness. Cardiovascular: Regular rate and rhythm. Lungs: Clear to auscultation bilaterally. Neck: There are no carotid bruits noted bilaterally.  NEUROLOGICAL:  Orientation:  The patient is alert and oriented x 3.   Cranial nerves: There is good facial symmetry.  There is facial hypomimia.  Speech is fluent and clear.  He is mildly hypophonic.  No significant difficulty with the guttural sounds.  Soft palate rises symmetrically and there is no tongue deviation. Hearing is intact to conversational tone. Sensation: Sensation is intact to light touch throughout. Coordination:  The patient has no dysdiadichokinesia or  dysmetria. Motor: Strength is 5/5 in the bilateral upper and lower extremities.  Shoulder shrug is equal bilaterally.  There is no pronator drift.  There are no fasciculations noted.   MOVEMENT EXAM:  Tone: There is normal tone in the right upper and lower extremity today.  There is normal tone on the left. Abnormal movements: There is right upper extremity resting tremor that increases with distraction and is intermittent.  None seen on the left today. Coordination:  There is fairly good rapid alternating movements bilaterally today. Gait and Station: The patient has no significant difficulty arising out of Guerrero deep-seated chair without the use of the hands. The patient's stride length is normal with decreased arm swing on the right only today.     Assessment/Plan:   1.  Parkinsonism  -I again had Guerrero long discussion with the patient, with greater than 50% of the 40 minute visit spent in counseling.  Once again, explained to the patient that I cannot tell if this is idiopathic Parkinson's disease or parkinsonism from Seroquel.    He cannot afford the DaT scan.  He is unable to get off of the Seroquel.    -Mirapex has helped and we will continue 1 mg 3 times per day.  We talked about potentially changing to the extended release to see if that would help daytime intermittent hypersomnolence, although I am not really sure this is from this medication.  He is on multiple medications that could be contributing.  He is not having sleep attacks.  He really does not wish to change medications and thinks he is doing okay.  We talked about safety.    -Congratulated him on the cardiovascular exercise that he is doing and encouraged him to continue that.  -Had lab work done with his primary care physician and I will check Guerrero copy of that. 2.  Dysphagia  -He previously canceled his modified barium swallow and does not wish to reschedule 3.  Depression  -He is under the care of psychiatry. 4.   Constipation  -given copy of rancho recipe 5.  Follow-up in the next 3 months, sooner should new neurologic issues arise.  Greater than 50% of the 25 minute visit spent in counseling discussing safety.

## 2014-07-27 NOTE — Patient Instructions (Signed)
Constipation and Parkinson's disease:  1.Rancho recipe for constipation in Parkinsons Disease:  -1 cup of bran, 2 cups of applesauce in 1 cup of prune juice 2.  Increase fiber intake (Metamucil,vegetables) 3.  Regular, moderate exercise can be beneficial. 4.  Avoid medications causing constipation, such as medications like antacids with calcium or magnesium 5.  Laxative overuse should be avoided. 6.  Stool softeners (Colace) can help with chronic constipation.

## 2014-07-30 ENCOUNTER — Other Ambulatory Visit: Payer: Self-pay | Admitting: Neurology

## 2014-07-30 NOTE — Telephone Encounter (Signed)
Rx sent 

## 2014-08-31 ENCOUNTER — Other Ambulatory Visit: Payer: Self-pay | Admitting: Neurology

## 2014-08-31 NOTE — Telephone Encounter (Signed)
Mirapex refill requested. Per last office note- patient to remain on medication. Refill approved and sent to patient's pharmacy.   

## 2014-09-21 IMAGING — CR DG CHEST 2V
2 series · 2 of 2 positions shown · non-contrast
Comparison: PA and lateral chest 08/18/2010.

CLINICAL DATA: Preoperative respiratory films.

CHEST - 2 VIEW

[w chest pa]
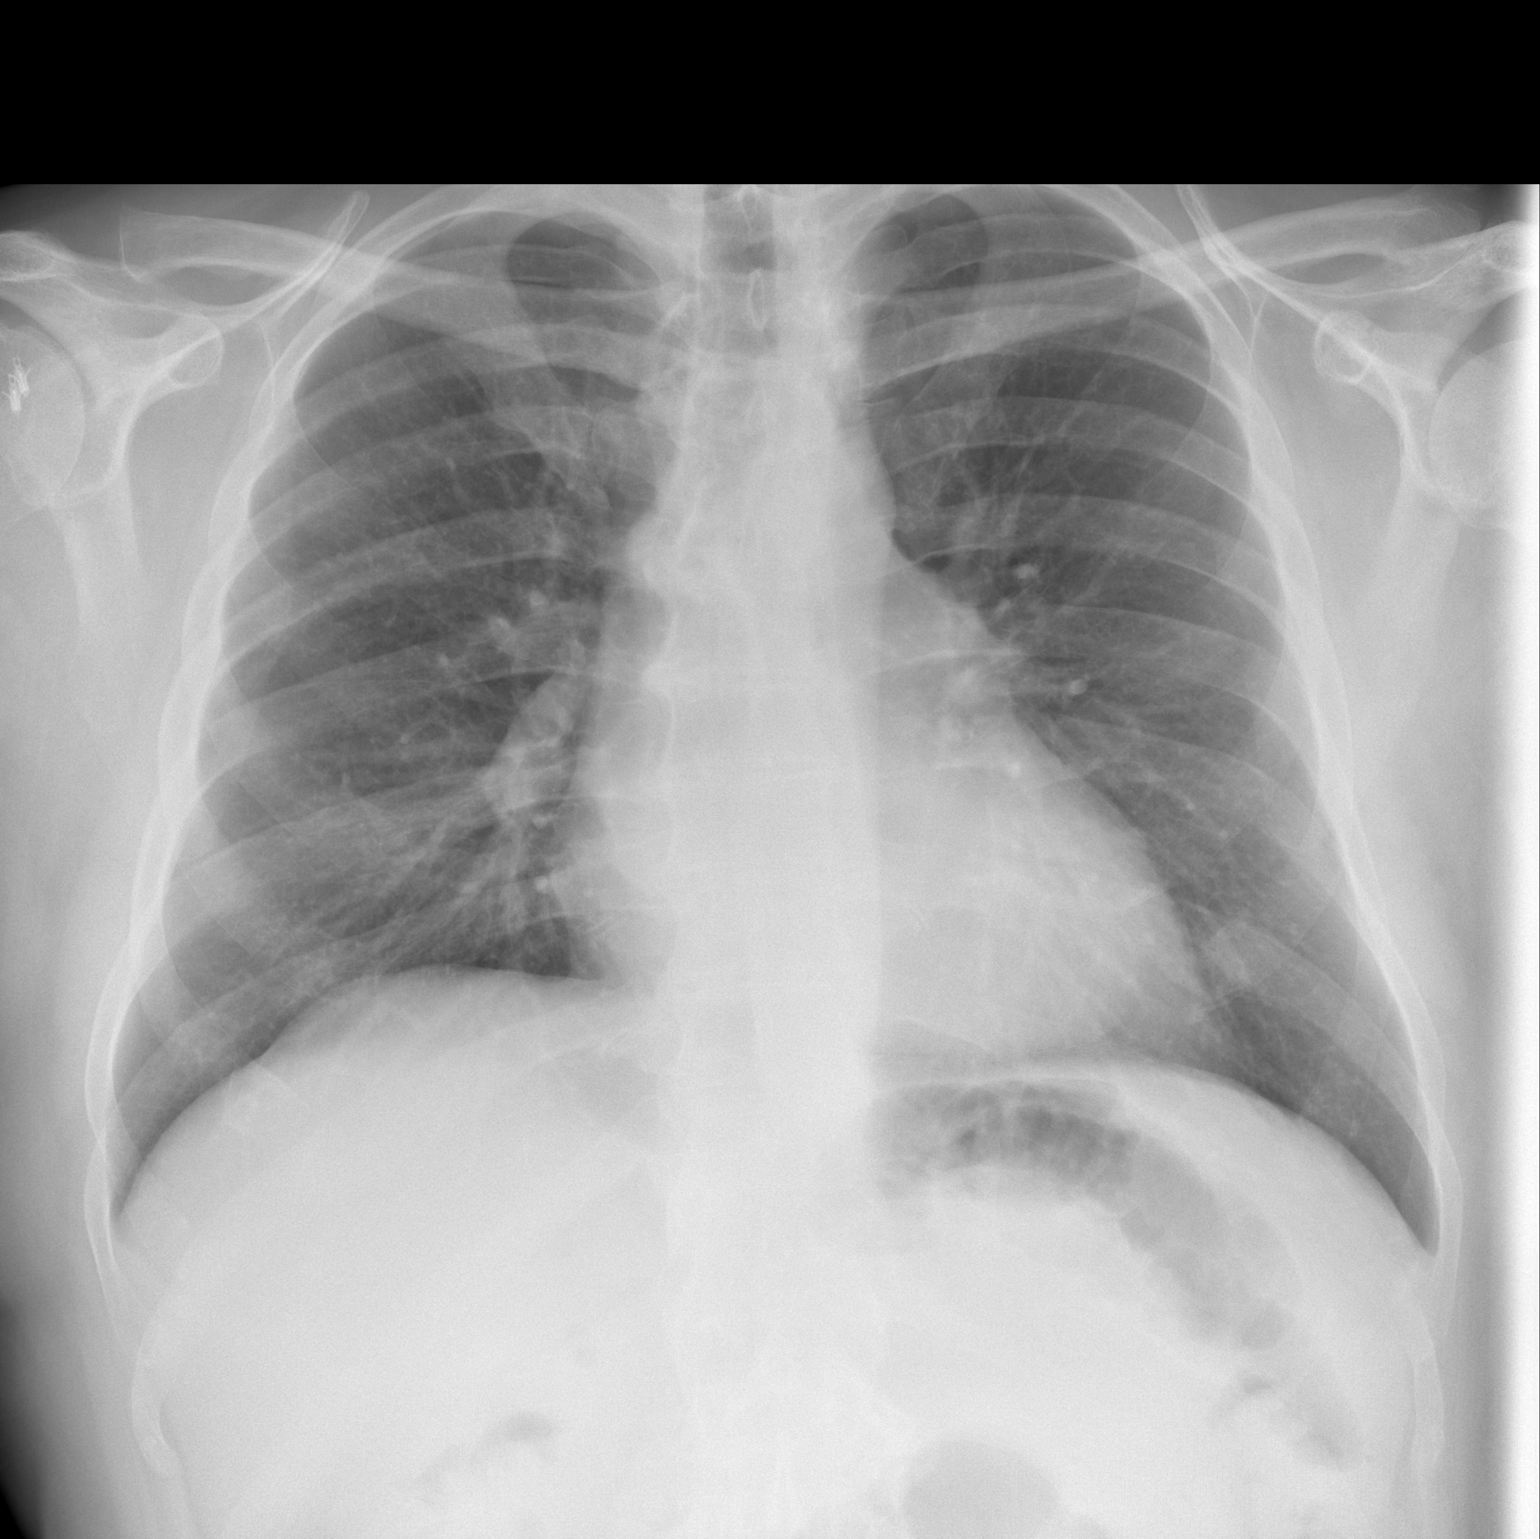

[w chest lat]
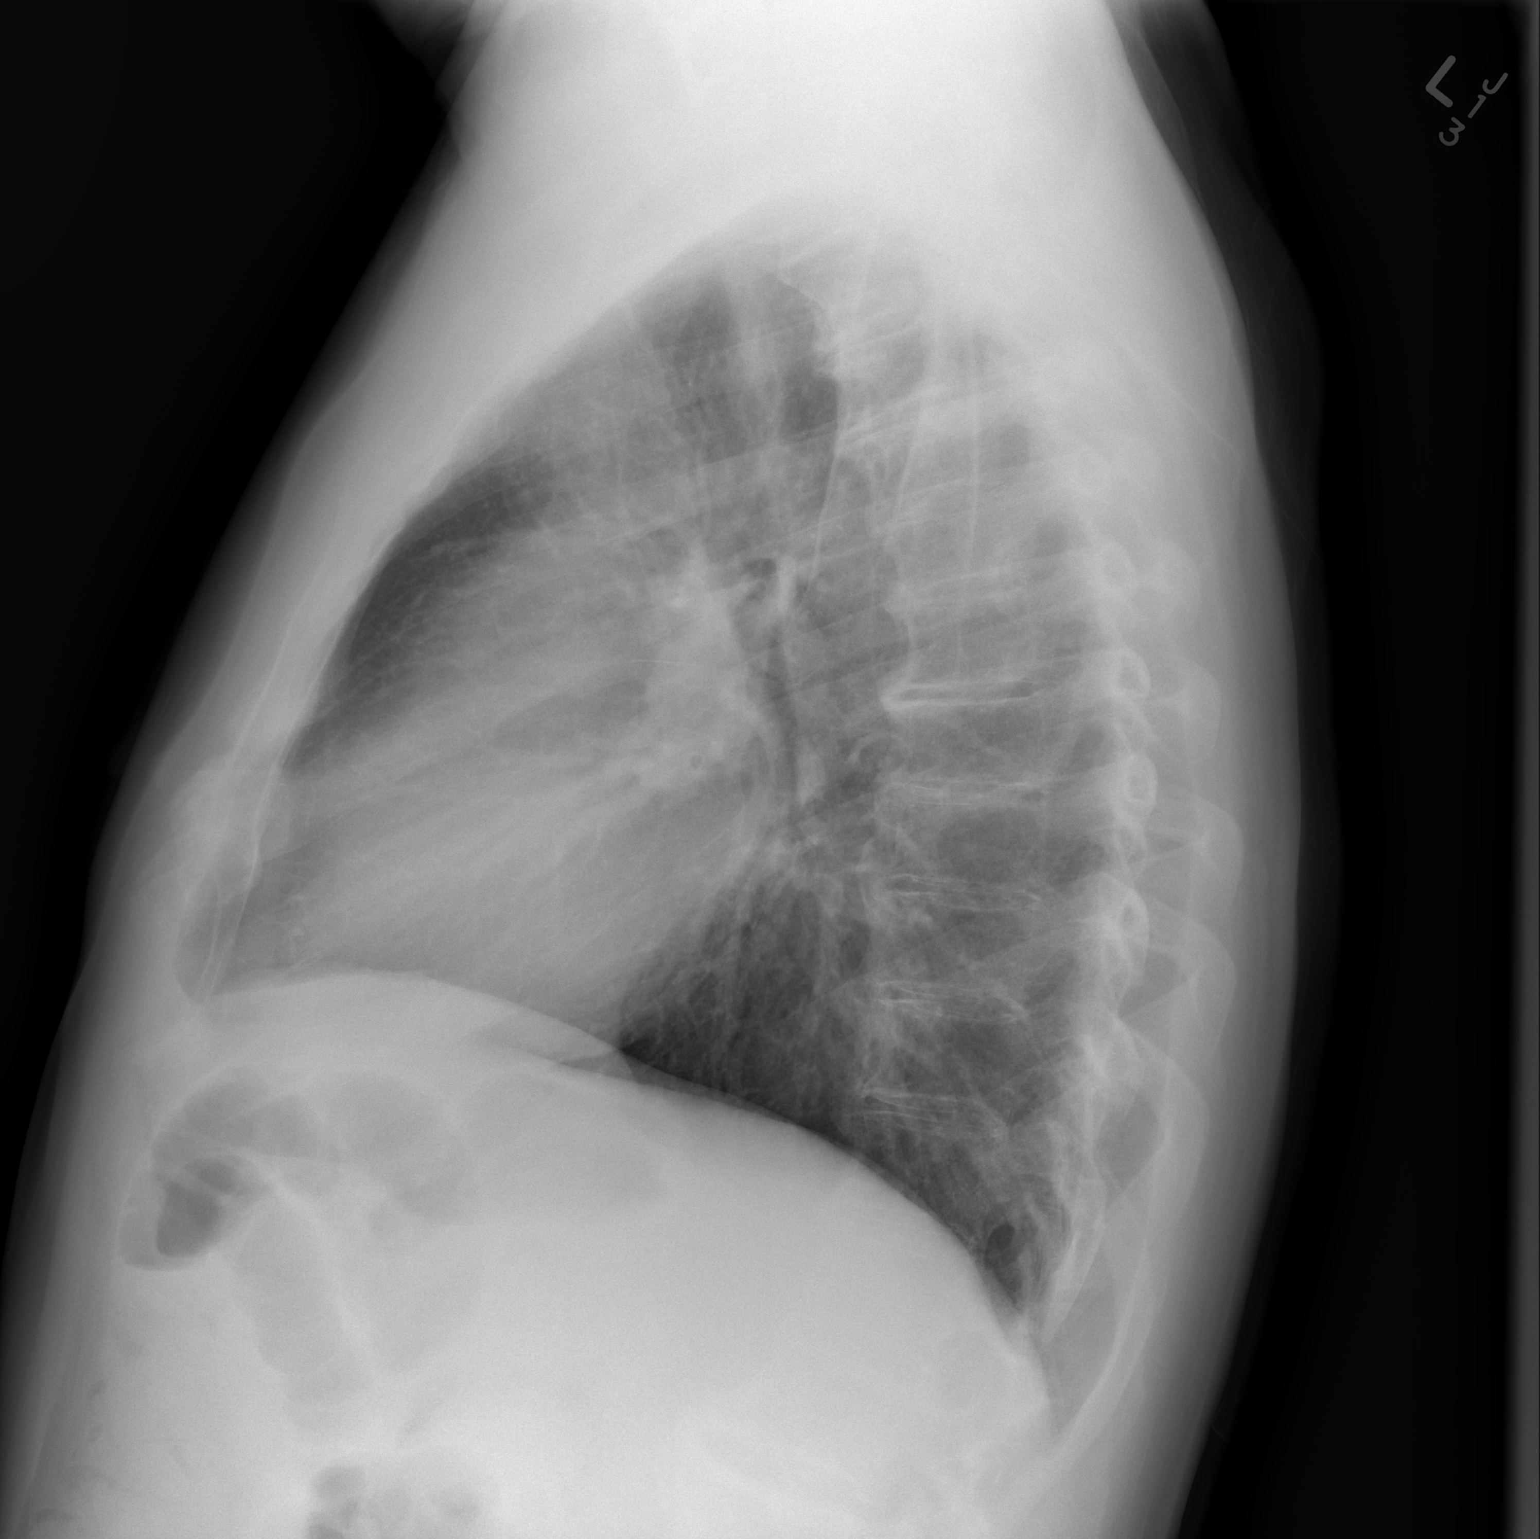

[2 of 2 positions shown; findings below may reference images not displayed]

FINDINGS: Lungs are clear.  Heart size is normal.  No pneumothorax
or pleural effusion.  No focal bony abnormality.
IMPRESSION: No acute disease.

## 2014-11-26 ENCOUNTER — Encounter: Payer: Self-pay | Admitting: Neurology

## 2014-11-26 ENCOUNTER — Ambulatory Visit (INDEPENDENT_AMBULATORY_CARE_PROVIDER_SITE_OTHER): Payer: Medicare Other | Admitting: Neurology

## 2014-11-26 ENCOUNTER — Other Ambulatory Visit (INDEPENDENT_AMBULATORY_CARE_PROVIDER_SITE_OTHER): Payer: Medicare Other

## 2014-11-26 VITALS — BP 144/70 | HR 65 | Ht 74.0 in | Wt 231.0 lb

## 2014-11-26 DIAGNOSIS — R413 Other amnesia: Secondary | ICD-10-CM | POA: Diagnosis not present

## 2014-11-26 DIAGNOSIS — Z5181 Encounter for therapeutic drug level monitoring: Secondary | ICD-10-CM

## 2014-11-26 DIAGNOSIS — G20 Parkinson's disease: Secondary | ICD-10-CM

## 2014-11-26 LAB — CBC WITH DIFFERENTIAL/PLATELET
Basophils Absolute: 0 10*3/uL (ref 0.0–0.1)
Basophils Relative: 0.6 % (ref 0.0–3.0)
Eosinophils Absolute: 0.1 10*3/uL (ref 0.0–0.7)
Eosinophils Relative: 1.5 % (ref 0.0–5.0)
HCT: 38.8 % — ABNORMAL LOW (ref 39.0–52.0)
Hemoglobin: 12.8 g/dL — ABNORMAL LOW (ref 13.0–17.0)
Lymphocytes Relative: 19.7 % (ref 12.0–46.0)
Lymphs Abs: 1.1 10*3/uL (ref 0.7–4.0)
MCHC: 33 g/dL (ref 30.0–36.0)
MCV: 96.2 fl (ref 78.0–100.0)
Monocytes Absolute: 0.6 10*3/uL (ref 0.1–1.0)
Monocytes Relative: 10.2 % (ref 3.0–12.0)
Neutro Abs: 3.8 10*3/uL (ref 1.4–7.7)
Neutrophils Relative %: 68 % (ref 43.0–77.0)
Platelets: 195 10*3/uL (ref 150.0–400.0)
RBC: 4.04 Mil/uL — ABNORMAL LOW (ref 4.22–5.81)
RDW: 13.6 % (ref 11.5–15.5)
WBC: 5.6 10*3/uL (ref 4.0–10.5)

## 2014-11-26 LAB — COMPREHENSIVE METABOLIC PANEL
ALT: 12 U/L (ref 0–53)
AST: 14 U/L (ref 0–37)
Albumin: 4.6 g/dL (ref 3.5–5.2)
Alkaline Phosphatase: 52 U/L (ref 39–117)
BUN: 22 mg/dL (ref 6–23)
CO2: 26 mEq/L (ref 19–32)
Calcium: 9.9 mg/dL (ref 8.4–10.5)
Chloride: 107 mEq/L (ref 96–112)
Creatinine, Ser: 0.89 mg/dL (ref 0.40–1.50)
GFR: 91.15 mL/min (ref >60.00)
Glucose, Bld: 103 mg/dL — ABNORMAL HIGH (ref 70–99)
Potassium: 3.9 mEq/L (ref 3.5–5.1)
Sodium: 141 mEq/L (ref 135–145)
Total Bilirubin: 0.8 mg/dL (ref 0.2–1.2)
Total Protein: 7.2 g/dL (ref 6.0–8.3)

## 2014-11-26 NOTE — Progress Notes (Signed)
Subjective:   Bill Guerrero was seen in consultation in the movement disorder clinic at the request of Dr. Clovis Pu.  His PCP is ARONSON,RICHARD A, MD.  The evaluation is for tremor.  The records that were made available to me were reviewed and I greatly appreciated the letter from Dr. Clovis Pu.  This patient is accompanied in the office by his spouse who supplements the history.   Pt has a long hx of tremor and a long hx of bipolar.  He has not been hospitalized since the 1970's for psychiatric illness.  He is now on lamictal 200 mg q day, trileptal 150 mg bid, seroquel XR which was recently increased to 300 q hs.    Pt reports that tremor started 2 years ago.  Pt states that it just started in the R hand and just with rest.  It has picked up with time and now involves the L hand as well.  It now interferes with writing and eating as well.  He thought that it was the lithium but lithium was d/c over a year ago without any change.  He doesn't believe that he was exposed to antipsychotics in the past, except the seroquel which he has been on for the last few years.  He states that rarely he will have "jumping" of the legs.  He says that voice has hypophonic speech.  He takes seroquel and ambien for sleep and doesn't move all night per pt.  Per wife, years ago, he would scream out in the sleep but he doesn't do that as much as in the past.  Pt also states that his walking is "stiffer" than in the past.  He has trouble getting out of a low couch.  No changes in smell, taste, swallowing.  No visual distortions/hallucinations.  Pt states that he can no longer write because he is slow.  Unknown if micrographia.  Pt is slow with ADL's but is able to do them.  No diplopia.  No syncope/lightheadness.     Pt is on currently on primidone 200 mg bid.  He has tried multiple other tremor meds including topamax (200 q day), artane 5 mg bid, cogentin 1.0 mg tid, and propranolol 60 mg tid.    12/19/13 update:  Patient  returns today for follow-up.   Patient has history of parkinsonism.  Unfortunately, he was unable to afford the dat scan.  I talked to his psychiatrist several times.  They had tried to reduce his Seroquel, but ultimately determined that he just could not get off of the medication.  Pt states today that the dosage was actually just doubled again.  After a long discussion with the psychiatrist as well as the patient/wife, we ultimately made the decision to go ahead and try Mirapex even though we did not really know if this was secondary parkinsonism or idiopathic Parkinson's disease.  Mirapex was started on 10/18/13 but states that he actually didn't get it picked up until 9/29.  He states that he has periods of time now where he doesn't shake, but he still shakes every day.  He takes the Mirapex at 8 AM/4 PM and midnightNo SE.  No compulsive behaviors.  No falls.  Really would like to get a definitive dx and feels that is important to mental wellbeing.  03/21/14 update:  Patient returns today for follow-up.  He is accompanied by his wife who supplements the history.  Patient has history of parkinsonism.  Unfortunately, he was unable to afford  the dat scan.  Last visit, I increased the patient's Mirapex, so that he is taking 1 mg in the morning and in the afternoon and 0.5 mg in the evening.  He remains on primidone 200 mg twice a day, which he was on prior to seeing me at his first visit.  I have not changed that.  Overall, the patient has been doing fairly well and feels that the medication is helping.  He does not think that he does as well as evening, but the evening dosage of Mirapex is lower.  He does notice that tremor comes back quicker and the medication only seems to last about an hour after he takes it in the evening.  He has had no falls.  He is a little lightheaded.  He is not exercising.  He just has difficulty finding the motivation to do that.  No hallucinations.  He did have one episode of dysphagia  that scared his wife.    07/27/14 update:  Pt returns for f/u.  We increased his mirapex to 1.0 mg tid last visit.  He states that it seemed to help.  He seems to have better and worse days for no known reason.  Does seem to have periods where very tired (never when driving).  Is off of primidone.  He continues to c/o dysphagia.  I set him up last visit for a MBE but the patient cancelled it and did not r/s.  He doesn't remember why he cancelled it.  He has refused PD PT as well.  States that he isn't getting worse.  He is exercising 4 days a week on his bike and is really proud of himself.   He is still on seroquel 300 mg q hs.  C/o issues with constipation  11/26/14 update:  The patient presents today for follow-up.  He is on pramipexole 1 mg 3 times per day.  He remains on high-dose Seroquel (300 mg) for the treatment of bipolar disorder.  I wanted to do lab work last visit but he told me he just had lab work done at his primary care physician.  I did call the primary care physician and got labs, but they were from July, 2015.  States that he is not feeling well.  He is exercising 10 miles a day (biking).  States that he has gained 30 pds and states that he is eating in the night without knowing it because of ambien.  He complains of his speech being hypophonic.  He complains that his memory is not very good.   Outside reports reviewed: historical medical records and referral letter/letters.  No Known Allergies  Outpatient Encounter Prescriptions as of 11/26/2014  Medication Sig  . Ascorbic Acid (VITAMIN C) 1000 MG tablet Take 1,000 mg by mouth 2 (two) times daily.   . B Complex-C (B-COMPLEX WITH VITAMIN C) tablet Take 1 tablet by mouth daily.  . Cholecalciferol (VITAMIN D-3) 5000 UNITS TABS Take 1 tablet by mouth daily.  . Coenzyme Q10 200 MG capsule Take 200 mg by mouth 2 (two) times daily.  Marland Kitchen DHEA 50 MG TABS Take 1 tablet by mouth 2 (two) times daily.  . fenofibrate 160 MG tablet Take 160 mg  by mouth daily before breakfast.  . fish oil-omega-3 fatty acids 1000 MG capsule Take 4 g by mouth 2 (two) times daily.   Marland Kitchen lamoTRIgine (LAMICTAL) 200 MG tablet Take 200 mg by mouth at bedtime.  Marland Kitchen LORazepam (ATIVAN) 0.5 MG tablet Take 0.5  mg by mouth as needed for anxiety.  . magnesium oxide (MAG-OX) 400 MG tablet Take 400 mg by mouth daily.  . niacin 500 MG tablet Take 500 mg by mouth daily with breakfast.  . OXcarbazepine (TRILEPTAL) 150 MG tablet Take 150 mg by mouth 2 (two) times daily.   . Potassium 99 MG TABS Take 1 tablet by mouth 2 (two) times daily.  . pramipexole (MIRAPEX) 1 MG tablet TAKE 1 TABLET BY MOUTH THREE TIMES DAILY  . pravastatin (PRAVACHOL) 80 MG tablet Take 80 mg by mouth daily before breakfast.   . QUEtiapine Fumarate (SEROQUEL XR) 150 MG TB24 Take 2 tablets by mouth at bedtime.   . verapamil (COVERA HS) 240 MG (CO) 24 hr tablet Take 240 mg by mouth daily before breakfast.   . zolpidem (AMBIEN) 5 MG tablet Take 5 mg by mouth at bedtime as needed for sleep.  . [DISCONTINUED] pramipexole (MIRAPEX) 0.5 MG tablet Take 2 tablets in the morning, 2 tablets in the afternoon, 1 in the evening (Patient taking differently: Take 1 mg by mouth 3 (three) times daily. 1 tablet am 1 tablet at noon 1 tablet pm)   No facility-administered encounter medications on file as of 11/26/2014.    Past Medical History  Diagnosis Date  . Hypertension   . Hyperlipemia   . Bipolar 1 disorder ([place])   . Tremor     RT HAND  . Arthritis     hips replacement  . Multiple body piercings   . 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 Pilonidal cyst excision  1986  . Groin mass open biopsy      MASS REMOVED FROM GROIN  . Rotator cuff repair      RIGHT  . Joint replacement      TOTAL LEFT HIP  . Back surgery      CERVICAL AND LUMBAR  . Hemorroidectomy    . Hernia  repair      BIL IN HERNIA  . Penile prosthesis implant N/A 06/17/2012    Procedure: THREE PIECE PENILE PROTHESIS INFLATABLE-COLOPLAST (PENILE SCROTAL APPROACH);  Surgeon: Fredricka Bonine, MD;  Location: [facility];  Service: Urology;  Laterality: N/A;  . Vasectomy Bilateral 06/17/2012    Procedure: VASECTOMY;  Surgeon: Fredricka Bonine, MD;  Location: [facility];  Service: Urology;  Laterality: Bilateral;  . Penile prosthesis implant N/A 10/07/2012    Procedure: PENILE PROTHESIS REMOVAL AND REPLACEMENT INFLATABLE, PENILE SCROTAL APPROACH;  Surgeon: Fredricka Bonine, MD;  Location: [facility];  Service: Urology;  Laterality: N/A;    Social History   Social History  . Marital Status: Married    Spouse Name: N/A  . Number of Children: N/A  . Years of Education: N/A   Occupational History  . Not on file.   Social History Main Topics  . Smoking status: Never Smoker   . Smokeless tobacco: Not on file  . Alcohol Use: No     Comment: no alcohol in 2 years  . Drug Use: No  . Sexual Activity: Yes   Other Topics Concern  . Not on file   Social History Narrative    Family Status  Relation Status Death Age  . Father Deceased     complications of surgery  . Mother Deceased     leukemia  . Sister Alive     healthy  .  Sister Alive     healthy  . Son Alive     healthy  . Son Alive     healthy  . Daughter Alive     healthy    Review of Systems A complete 10 system ROS was obtained and was negative apart from what is mentioned.   Objective:   VITALS:   Filed Vitals:   11/26/14 1103  BP: 144/70  Pulse: 65  Height: 6\' 2"  (1.88 m)  Weight: 231 lb (104.781 kg)   Wt Readings from Last 3 Encounters:  11/26/14 231 lb (104.781 kg)  07/27/14 224 lb (101.606 kg)  03/21/14 212 lb (96.163 kg)     Gen:  Appears stated age and in NAD. HEENT:  Normocephalic, atraumatic. The mucous membranes are moist. The superficial temporal arteries are without ropiness or  tenderness. Cardiovascular: Regular rate and rhythm. Lungs: Clear to auscultation bilaterally. Neck: There are no carotid bruits noted bilaterally.  NEUROLOGICAL:  Orientation:  The patient is alert and oriented x 3.   Cranial nerves: There is good facial symmetry.  There is facial hypomimia.  Speech is fluent and clear.  He is mildly hypophonic.  No significant difficulty with the guttural sounds.  Soft palate rises symmetrically and there is no tongue deviation. Hearing is intact to conversational tone. Sensation: Sensation is intact to light touch throughout. Coordination:  The patient has no dysdiadichokinesia or dysmetria. Motor: Strength is 5/5 in the bilateral upper and lower extremities.  Shoulder shrug is equal bilaterally.  There is no pronator drift.  There are no fasciculations noted.   MOVEMENT EXAM:  Tone: There is mild rigidity in the right upper extremity. Abnormal movements: There is right upper extremity resting tremor that increases with distraction and is intermittent.  None seen on the left today. Coordination:  There is decreased finger taps and toe taps on the right.  There is decreased toe taps on the left.  Other rapid alternating movements were good. Gait and Station: The patient has no significant difficulty arising out of a deep-seated chair without the use of the hands. The patient's stride length is normal with decreased arm swing on the right only today.  He is more stooped today.     Assessment/Plan:   1.  Parkinsonism  - Once again, explained to the patient that I cannot tell if this is idiopathic Parkinson's disease or parkinsonism from Seroquel.    He cannot afford the DaT scan.  He is unable to get off of the Seroquel.    -Mirapex has helped and we will continue 1 mg 3 times per day.  He is still somewhat rigid, but I did not want to change his medications right now.    -Congratulated him on the cardiovascular exercise that he is doing and encouraged him  to continue that.  -We will get lab work including CBC and [place] that he could benefit from speech therapy, but states that it is too expensive and does not wish for me to send a referral. 2.  Dysphagia  -He previously canceled his modified barium swallow and does not wish to reschedule 3.  Depression  -He is under the care of psychiatry. 4.  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  Medications may play a role.  I do not think that he has any true dementia.  Talked about the value of neuropsych testing, but he does not want this right now. 4.  Constipation  -given copy of rancho recipe  5.  Follow-up in the next 3 months, sooner should new neurologic issues arise.  Greater than 50% of the 25 minute visit spent in counseling discussing safety.

## 2014-11-26 NOTE — Patient Instructions (Signed)
Your provider has requested that you have labwork completed today. Please go to [facility] on the second floor of this building before leaving the office today. You do not need to check in. If you are not called within 15 minutes please check with the front desk.   

## 2014-11-27 ENCOUNTER — Telehealth: Payer: Self-pay | Admitting: Neurology

## 2014-11-27 NOTE — Telephone Encounter (Signed)
Patient made aware labs normal.  

## 2014-11-27 NOTE — Telephone Encounter (Signed)
-----   Message from Thornton, DO sent at 11/27/2014  7:36 AM EDT ----- You can let pt know that labs ok

## 2014-11-27 NOTE — Telephone Encounter (Signed)
Left message on machine for patient to call back.

## 2014-12-07 DIAGNOSIS — Z23 Encounter for immunization: Secondary | ICD-10-CM | POA: Diagnosis not present

## 2014-12-07 DIAGNOSIS — F332 Major depressive disorder, recurrent severe without psychotic features: Secondary | ICD-10-CM | POA: Diagnosis not present

## 2015-03-06 ENCOUNTER — Other Ambulatory Visit: Payer: Self-pay | Admitting: Neurology

## 2015-03-06 NOTE — Telephone Encounter (Signed)
Rx sent 

## 2015-03-29 ENCOUNTER — Ambulatory Visit: Payer: Medicare Other | Admitting: Neurology

## 2015-04-04 ENCOUNTER — Other Ambulatory Visit: Payer: Self-pay | Admitting: Neurology

## 2015-04-04 NOTE — Telephone Encounter (Signed)
Mirapex refill requested. Per last office note- patient to remain on medication. Refill approved and sent to patient's pharmacy.   

## 2015-04-10 ENCOUNTER — Encounter: Payer: Self-pay | Admitting: Neurology

## 2015-04-10 ENCOUNTER — Ambulatory Visit (INDEPENDENT_AMBULATORY_CARE_PROVIDER_SITE_OTHER): Payer: Medicare Other | Admitting: Neurology

## 2015-04-10 VITALS — BP 98/60 | HR 63 | Ht 74.0 in | Wt 256.0 lb

## 2015-04-10 DIAGNOSIS — R413 Other amnesia: Secondary | ICD-10-CM

## 2015-04-10 DIAGNOSIS — K117 Disturbances of salivary secretion: Secondary | ICD-10-CM

## 2015-04-10 DIAGNOSIS — M25551 Pain in right hip: Secondary | ICD-10-CM

## 2015-04-10 DIAGNOSIS — G20 Parkinson's disease: Secondary | ICD-10-CM | POA: Diagnosis not present

## 2015-04-10 DIAGNOSIS — R635 Abnormal weight gain: Secondary | ICD-10-CM | POA: Diagnosis not present

## 2015-04-10 MED ORDER — PRAMIPEXOLE DIHYDROCHLORIDE ER 3 MG PO TB24: 1.0000 | ORAL_TABLET | Freq: Every day | ORAL | Status: DC
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
## 2015-05-06 ENCOUNTER — Other Ambulatory Visit: Payer: Self-pay | Admitting: Neurology

## 2015-05-07 ENCOUNTER — Other Ambulatory Visit: Payer: Self-pay | Admitting: Neurology

## 2015-05-09 ENCOUNTER — Telehealth: Payer: Self-pay | Admitting: Neurology

## 2015-05-09 MED ORDER — PRAMIPEXOLE DIHYDROCHLORIDE 1 MG PO TABS: 1.0000 mg | ORAL_TABLET | Freq: Three times a day (TID) | ORAL | Status: DC

## 2015-05-09 NOTE — Telephone Encounter (Signed)
Mirapex refill requested. Per last office note- patient to remain on medication. Refill approved and sent to patient's pharmacy. Patient is not able to take extended release RX due to cost. [facility] making patient aware.

## 2015-05-24 DIAGNOSIS — F332 Major depressive disorder, recurrent severe without psychotic features: Secondary | ICD-10-CM | POA: Diagnosis not present

## 2015-07-24 ENCOUNTER — Ambulatory Visit (INDEPENDENT_AMBULATORY_CARE_PROVIDER_SITE_OTHER): Payer: Medicare Other | Admitting: Neurology

## 2015-07-24 ENCOUNTER — Encounter: Payer: Self-pay | Admitting: Neurology

## 2015-07-24 VITALS — BP 126/72 | HR 73 | Ht 73.5 in | Wt 263.0 lb

## 2015-07-24 DIAGNOSIS — F331 Major depressive disorder, recurrent, moderate: Secondary | ICD-10-CM | POA: Diagnosis not present

## 2015-07-24 DIAGNOSIS — G20 Parkinson's disease: Secondary | ICD-10-CM

## 2015-07-24 MED ORDER — PRAMIPEXOLE DIHYDROCHLORIDE 1 MG PO TABS: 1.5000 mg | ORAL_TABLET | Freq: Three times a day (TID) | ORAL | Status: DC
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
## 2015-07-24 NOTE — Patient Instructions (Signed)
1. Increase to 1 1/2 tablets three times daily of Pramipexole (Mirapex).

## 2015-08-12 DIAGNOSIS — M1990 Unspecified osteoarthritis, unspecified site: Secondary | ICD-10-CM | POA: Diagnosis not present

## 2015-08-12 DIAGNOSIS — G250 Essential tremor: Secondary | ICD-10-CM | POA: Diagnosis not present

## 2015-08-12 DIAGNOSIS — M459 Ankylosing spondylitis of unspecified sites in spine: Secondary | ICD-10-CM | POA: Diagnosis not present

## 2015-08-12 DIAGNOSIS — F319 Bipolar disorder, unspecified: Secondary | ICD-10-CM | POA: Diagnosis not present

## 2015-08-12 DIAGNOSIS — Z1389 Encounter for screening for other disorder: Secondary | ICD-10-CM | POA: Diagnosis not present

## 2015-08-12 DIAGNOSIS — E784 Other hyperlipidemia: Secondary | ICD-10-CM | POA: Diagnosis not present

## 2015-08-12 DIAGNOSIS — N529 Male erectile dysfunction, unspecified: Secondary | ICD-10-CM | POA: Diagnosis not present

## 2015-08-12 DIAGNOSIS — Z6835 Body mass index (BMI) 35.0-35.9, adult: Secondary | ICD-10-CM | POA: Diagnosis not present

## 2015-08-12 DIAGNOSIS — K5909 Other constipation: Secondary | ICD-10-CM | POA: Diagnosis not present

## 2015-08-12 DIAGNOSIS — G20 Parkinson's disease: Secondary | ICD-10-CM | POA: Diagnosis not present

## 2015-08-12 DIAGNOSIS — I10 Essential (primary) hypertension: Secondary | ICD-10-CM | POA: Diagnosis not present
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
## 2015-11-26 ENCOUNTER — Ambulatory Visit (INDEPENDENT_AMBULATORY_CARE_PROVIDER_SITE_OTHER): Payer: Medicare Other | Admitting: Neurology

## 2015-11-26 ENCOUNTER — Encounter: Payer: Self-pay | Admitting: Neurology

## 2015-11-26 ENCOUNTER — Telehealth: Payer: Self-pay | Admitting: Clinical

## 2015-11-26 VITALS — BP 128/80 | HR 70 | Ht 74.0 in | Wt 270.0 lb

## 2015-11-26 DIAGNOSIS — R635 Abnormal weight gain: Secondary | ICD-10-CM | POA: Diagnosis not present

## 2015-11-26 DIAGNOSIS — F331 Major depressive disorder, recurrent, moderate: Secondary | ICD-10-CM | POA: Diagnosis not present

## 2015-11-26 DIAGNOSIS — G20 Parkinson's disease: Secondary | ICD-10-CM

## 2015-11-26 MED ORDER — CARBIDOPA-LEVODOPA 25-100 MG PO TABS: 1.0000 | ORAL_TABLET | Freq: Three times a day (TID) | ORAL | 2 refills | Status: DC

## 2015-11-26 NOTE — Telephone Encounter (Signed)
CSW received request from Dr. Carles Collet to follow up with pt to provide counseling resources. CSW contact pt via telephone. Unable to reach at this time. CSW left voice message. CSW to await return phone call.   Alison Murray, MSW, LCSW Clinical Social Worker Movement [facility] [phone]

## 2015-11-26 NOTE — Patient Instructions (Signed)
1. Decrease Mirapex to one tablet three times daily.   2. Start Carbidopa Levodopa as follows:  Take 1/2 tablet three times daily, at least 30 minutes before meals, for one week  Then take 1/2 tablet in the morning, 1/2 tablet in the afternoon, 1 tablet in the evening, at least 30 minutes before meals, for one week  Then take 1/2 tablet in the morning, 1 tablet in the afternoon, 1 tablet in the evening, at least 30 minutes before meals, for one week  Then take 1 tablet three times daily, at least 30 minutes before meals  3. For information about the Parkinson's Exercise Scholarship contact:  Phillis Haggis [phone] [email or web address]  4. Talk to your primary care provider about wax removal from your ears.

## 2015-11-29 DIAGNOSIS — Z23 Encounter for immunization: Secondary | ICD-10-CM | POA: Diagnosis not present

## 2015-12-09 DIAGNOSIS — F332 Major depressive disorder, recurrent severe without psychotic features: Secondary | ICD-10-CM | POA: Diagnosis not present

## 2016-01-03 DIAGNOSIS — M79604 Pain in right leg: Secondary | ICD-10-CM | POA: Diagnosis not present

## 2016-01-03 DIAGNOSIS — R600 Localized edema: Secondary | ICD-10-CM | POA: Diagnosis not present

## 2016-01-03 DIAGNOSIS — I10 Essential (primary) hypertension: Secondary | ICD-10-CM | POA: Diagnosis not present

## 2016-01-03 DIAGNOSIS — G20 Parkinson's disease: Secondary | ICD-10-CM | POA: Diagnosis not present

## 2016-01-03 DIAGNOSIS — M7989 Other specified soft tissue disorders: Secondary | ICD-10-CM | POA: Diagnosis not present

## 2016-01-03 DIAGNOSIS — Z6836 Body mass index (BMI) 36.0-36.9, adult: Secondary | ICD-10-CM | POA: Diagnosis not present

## 2016-01-14 DIAGNOSIS — H9011 Conductive hearing loss, unilateral, right ear, with unrestricted hearing on the contralateral side: Secondary | ICD-10-CM | POA: Diagnosis not present

## 2016-01-14 DIAGNOSIS — G20 Parkinson's disease: Secondary | ICD-10-CM | POA: Diagnosis not present

## 2016-01-14 DIAGNOSIS — H6123 Impacted cerumen, bilateral: Secondary | ICD-10-CM | POA: Diagnosis not present

## 2016-03-06 ENCOUNTER — Other Ambulatory Visit: Payer: Self-pay | Admitting: Neurology

## 2016-03-08 ENCOUNTER — Other Ambulatory Visit: Payer: Self-pay | Admitting: Neurology
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
## 2016-03-30 ENCOUNTER — Encounter: Payer: Self-pay | Admitting: Neurology

## 2016-03-30 ENCOUNTER — Other Ambulatory Visit (INDEPENDENT_AMBULATORY_CARE_PROVIDER_SITE_OTHER): Payer: Medicare Other

## 2016-03-30 ENCOUNTER — Ambulatory Visit (INDEPENDENT_AMBULATORY_CARE_PROVIDER_SITE_OTHER): Payer: Medicare Other | Admitting: Neurology

## 2016-03-30 VITALS — BP 130/70 | HR 74 | Ht 74.0 in | Wt 284.0 lb

## 2016-03-30 DIAGNOSIS — R600 Localized edema: Secondary | ICD-10-CM

## 2016-03-30 DIAGNOSIS — G20C Parkinsonism, unspecified: Secondary | ICD-10-CM

## 2016-03-30 DIAGNOSIS — G20 Parkinson's disease: Secondary | ICD-10-CM

## 2016-03-30 DIAGNOSIS — R5383 Other fatigue: Secondary | ICD-10-CM

## 2016-03-30 DIAGNOSIS — R251 Tremor, unspecified: Secondary | ICD-10-CM

## 2016-03-30 LAB — COMPREHENSIVE METABOLIC PANEL
ALT: 27 U/L (ref 0–53)
AST: 21 U/L (ref 0–37)
Albumin: 4.7 g/dL (ref 3.5–5.2)
Alkaline Phosphatase: 65 U/L (ref 39–117)
BUN: 17 mg/dL (ref 6–23)
CO2: 25 mEq/L (ref 19–32)
Calcium: 9.4 mg/dL (ref 8.4–10.5)
Chloride: 107 mEq/L (ref 96–112)
Creatinine, Ser: 0.8 mg/dL (ref 0.40–1.50)
GFR: 102.66 mL/min (ref >60.00)
Glucose, Bld: 97 mg/dL (ref 70–99)
Potassium: 3.7 mEq/L (ref 3.5–5.1)
Sodium: 141 mEq/L (ref 135–145)
Total Bilirubin: 0.6 mg/dL (ref 0.2–1.2)
Total Protein: 6.9 g/dL (ref 6.0–8.3)

## 2016-03-30 LAB — VITAMIN B12: Vitamin B-12: 225 pg/mL (ref 211–911)

## 2016-03-30 LAB — TSH: TSH: 2.11 u[IU]/mL (ref 0.35–4.50)

## 2016-03-30 LAB — BRAIN NATRIURETIC PEPTIDE: Pro B Natriuretic peptide (BNP): 53 pg/mL (ref 0.0–100.0)

## 2016-03-31 ENCOUNTER — Telehealth: Payer: Self-pay | Admitting: Neurology

## 2016-03-31 DIAGNOSIS — G20 Parkinson's disease: Secondary | ICD-10-CM | POA: Diagnosis not present

## 2016-03-31 DIAGNOSIS — I10 Essential (primary) hypertension: Secondary | ICD-10-CM | POA: Diagnosis not present

## 2016-03-31 DIAGNOSIS — R0602 Shortness of breath: Secondary | ICD-10-CM | POA: Diagnosis not present

## 2016-03-31 DIAGNOSIS — R600 Localized edema: Secondary | ICD-10-CM | POA: Diagnosis not present

## 2016-03-31 DIAGNOSIS — Z6838 Body mass index (BMI) 38.0-38.9, adult: Secondary | ICD-10-CM | POA: Diagnosis not present

## 2016-03-31 NOTE — Telephone Encounter (Signed)
Left message on machine for patient to call back.

## 2016-03-31 NOTE — Telephone Encounter (Signed)
-----   Message from [place], DO sent at 03/30/2016  4:47 PM EST ----- Luvenia Starch, fax copy to Dr. Reynaldo Minium.  Tell pt B12 is low.  Have him take oral supplement, 1035mcg daily

## 2016-03-31 NOTE — Telephone Encounter (Signed)
Results sent to Dr. Reynaldo Minium.

## 2016-04-01 NOTE — Telephone Encounter (Signed)
Letter mailed to patient.

## 2016-04-02 ENCOUNTER — Telehealth (HOSPITAL_COMMUNITY): Payer: Self-pay | Admitting: Internal Medicine

## 2016-04-05 ENCOUNTER — Other Ambulatory Visit: Payer: Self-pay | Admitting: Neurology

## 2016-04-07 DIAGNOSIS — E538 Deficiency of other specified B group vitamins: Secondary | ICD-10-CM | POA: Diagnosis not present

## 2016-04-07 DIAGNOSIS — Z6837 Body mass index (BMI) 37.0-37.9, adult: Secondary | ICD-10-CM | POA: Diagnosis not present

## 2016-04-10 NOTE — Telephone Encounter (Signed)
04/02/2016 03:29 PM Phone ([place]) Guerrero, Bill Cure (Self) [phone] (H)   Left Message - Called pt and lmsg for him to CB..in regards to scheduling an echo.    By Verdene Rio    04/07/2016 10:19 AM Phone (Outgoing) Bill Guerrero, Bill Nannini (Self) [phone] (H)   Left Message - Called pt and lmsg for him to CB    By Verdene Rio    04/08/2016 02:47 PM Phone ([address]) Guerrero, [name] (Self) [phone] (H)   Left Message - Called pt and lmsg him to CB to schedule echo.     By Verdene Rio    04/10/2016 11:21 AM Phone (Outgoing) Bill Guerrero, Bill Guerrero (Self) [phone] (H) Remove  Left Message - Called pt and lmsg for him to CB in regards to getting scheduled for echo requested by [place].     By Verdene Rio

## 2016-06-04 DIAGNOSIS — E559 Vitamin D deficiency, unspecified: Secondary | ICD-10-CM | POA: Diagnosis not present

## 2016-06-04 DIAGNOSIS — E784 Other hyperlipidemia: Secondary | ICD-10-CM | POA: Diagnosis not present

## 2016-06-04 DIAGNOSIS — E538 Deficiency of other specified B group vitamins: Secondary | ICD-10-CM | POA: Diagnosis not present

## 2016-06-04 DIAGNOSIS — I10 Essential (primary) hypertension: Secondary | ICD-10-CM | POA: Diagnosis not present

## 2016-06-04 DIAGNOSIS — Z125 Encounter for screening for malignant neoplasm of prostate: Secondary | ICD-10-CM | POA: Diagnosis not present

## 2016-06-23 DIAGNOSIS — R600 Localized edema: Secondary | ICD-10-CM | POA: Diagnosis not present

## 2016-06-23 DIAGNOSIS — Z0000 Encounter for general adult medical examination without abnormal findings: Secondary | ICD-10-CM | POA: Diagnosis not present

## 2016-06-23 DIAGNOSIS — Z6837 Body mass index (BMI) 37.0-37.9, adult: Secondary | ICD-10-CM | POA: Diagnosis not present

## 2016-06-23 DIAGNOSIS — Z23 Encounter for immunization: Secondary | ICD-10-CM | POA: Diagnosis not present

## 2016-06-23 DIAGNOSIS — G250 Essential tremor: Secondary | ICD-10-CM | POA: Diagnosis not present

## 2016-06-23 DIAGNOSIS — G20 Parkinson's disease: Secondary | ICD-10-CM | POA: Diagnosis not present

## 2016-06-23 DIAGNOSIS — E784 Other hyperlipidemia: Secondary | ICD-10-CM | POA: Diagnosis not present

## 2016-06-23 DIAGNOSIS — N529 Male erectile dysfunction, unspecified: Secondary | ICD-10-CM | POA: Diagnosis not present

## 2016-06-23 DIAGNOSIS — E538 Deficiency of other specified B group vitamins: Secondary | ICD-10-CM | POA: Diagnosis not present

## 2016-06-23 DIAGNOSIS — I10 Essential (primary) hypertension: Secondary | ICD-10-CM | POA: Diagnosis not present

## 2016-06-23 DIAGNOSIS — R0602 Shortness of breath: Secondary | ICD-10-CM | POA: Diagnosis not present

## 2016-06-23 DIAGNOSIS — F319 Bipolar disorder, unspecified: Secondary | ICD-10-CM | POA: Diagnosis not present

## 2016-07-07 DIAGNOSIS — F332 Major depressive disorder, recurrent severe without psychotic features: Secondary | ICD-10-CM | POA: Diagnosis not present
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
## 2016-07-31 ENCOUNTER — Ambulatory Visit (INDEPENDENT_AMBULATORY_CARE_PROVIDER_SITE_OTHER): Payer: Medicare Other | Admitting: Neurology

## 2016-07-31 ENCOUNTER — Encounter: Payer: Self-pay | Admitting: Neurology

## 2016-07-31 VITALS — BP 162/84 | HR 70 | Ht 73.5 in | Wt 287.0 lb

## 2016-07-31 DIAGNOSIS — G20 Parkinson's disease: Secondary | ICD-10-CM | POA: Diagnosis not present

## 2016-07-31 DIAGNOSIS — R635 Abnormal weight gain: Secondary | ICD-10-CM

## 2016-07-31 DIAGNOSIS — M545 Low back pain: Secondary | ICD-10-CM | POA: Diagnosis not present

## 2016-07-31 DIAGNOSIS — F331 Major depressive disorder, recurrent, moderate: Secondary | ICD-10-CM | POA: Diagnosis not present

## 2016-07-31 DIAGNOSIS — K5901 Slow transit constipation: Secondary | ICD-10-CM | POA: Diagnosis not present

## 2016-07-31 NOTE — Patient Instructions (Signed)
1.  Constipation and Parkinson's disease:  1.Rancho recipe for constipation in Parkinsons Disease:  -1 cup of unprocessed bran (need to get this at [employer], [employer] or similar type of store), 2 cups of applesauce in 1 cup of prune juice 2.  Increase fiber intake (Metamucil,vegetables) 3.  Regular, moderate exercise can be beneficial. 4.  Avoid medications causing constipation, such as medications like antacids with calcium or magnesium 5.  Laxative overuse should be avoided. 6.  Stool softeners (Colace) can help with chronic constipation. 7.  Increase water intake.  You should be drinking 1/2 gallon of water a day as long as you have not been diagnosed with congestive heart failure or renal/kidney failure.  This is probably the single greatest thing that you can do to help your constipation.  I will send a referral to Dr. Letta Pate office for your back.  Please promise me that you will go!

## 2016-10-15 ENCOUNTER — Other Ambulatory Visit: Payer: Self-pay | Admitting: Neurology

## 2016-11-05 ENCOUNTER — Ambulatory Visit: Payer: Medicare Other | Admitting: Neurology

## 2016-11-11 ENCOUNTER — Telehealth: Payer: Self-pay | Admitting: Neurology

## 2016-11-11 NOTE — Telephone Encounter (Signed)
Pt called and has a question about his handicap placard and it expiring

## 2016-11-12 NOTE — Telephone Encounter (Signed)
Left message on machine for patient to call back.

## 2016-11-12 NOTE — Telephone Encounter (Signed)
Spoke with patient. New handicap placard application sent to patient.

## 2016-11-26 DIAGNOSIS — Z23 Encounter for immunization: Secondary | ICD-10-CM | POA: Diagnosis not present

## 2016-12-16 DIAGNOSIS — L989 Disorder of the skin and subcutaneous tissue, unspecified: Secondary | ICD-10-CM | POA: Diagnosis not present

## 2016-12-16 DIAGNOSIS — R600 Localized edema: Secondary | ICD-10-CM | POA: Diagnosis not present

## 2016-12-16 DIAGNOSIS — Z6838 Body mass index (BMI) 38.0-38.9, adult: Secondary | ICD-10-CM | POA: Diagnosis not present

## 2016-12-22 DIAGNOSIS — F332 Major depressive disorder, recurrent severe without psychotic features: Secondary | ICD-10-CM | POA: Diagnosis not present
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
## 2017-01-05 ENCOUNTER — Ambulatory Visit (INDEPENDENT_AMBULATORY_CARE_PROVIDER_SITE_OTHER): Payer: Medicare Other | Admitting: Neurology

## 2017-01-05 ENCOUNTER — Encounter: Payer: Self-pay | Admitting: Neurology

## 2017-01-05 VITALS — BP 140/84 | HR 74 | Ht 73.0 in | Wt 285.0 lb

## 2017-01-05 DIAGNOSIS — G20 Parkinson's disease: Secondary | ICD-10-CM

## 2017-01-05 DIAGNOSIS — F319 Bipolar disorder, unspecified: Secondary | ICD-10-CM

## 2017-01-05 DIAGNOSIS — M545 Low back pain: Secondary | ICD-10-CM | POA: Diagnosis not present

## 2017-01-05 DIAGNOSIS — G8929 Other chronic pain: Secondary | ICD-10-CM | POA: Diagnosis not present

## 2017-01-05 NOTE — Patient Instructions (Addendum)
Merry Christmas!  Call Dr. Letta Pate and make an appointment!   I will see you in 4-5 months.    Parkinson's Caregiver Group   Save the Date: First meeting  will be on March 01, 2017  from 2-3:30  Parkinson's disease can be challenging for caregivers too. Changing  abilities and assuming new roles within the family can cause emotional  upheaval. Meeting with a group of peers who are experiencing similar changes is very beneficial for any caregiver. This professionally led support group will provide a safe place for Parkinson's caregivers to connect, share challenges, seek solutions, learn about resources and strengthen coping skills.    When:  Last Monday of the month (unless date falls on a holiday)  2019 Dates: 1/28, 2/25, 3/25, 4/29, 5/20, 6/24, 7/29, 8/26, 9/30, 10/28, 11/25, 12/30   Location: [facility]                                                                                              [place]                                                                                      Room 204 Time: 2-3:30   Please call Myra Gianotti, MSW, LCSW at [phone] with any questions.

## 2017-02-15 ENCOUNTER — Other Ambulatory Visit: Payer: Self-pay | Admitting: Neurology

## 2017-03-04 DIAGNOSIS — D225 Melanocytic nevi of trunk: Secondary | ICD-10-CM | POA: Diagnosis not present

## 2017-03-04 DIAGNOSIS — L814 Other melanin hyperpigmentation: Secondary | ICD-10-CM | POA: Diagnosis not present

## 2017-03-04 DIAGNOSIS — L218 Other seborrheic dermatitis: Secondary | ICD-10-CM | POA: Diagnosis not present

## 2017-03-04 DIAGNOSIS — C4371 Malignant melanoma of right lower limb, including hip: Secondary | ICD-10-CM | POA: Diagnosis not present

## 2017-03-04 DIAGNOSIS — D0371 Melanoma in situ of right lower limb, including hip: Secondary | ICD-10-CM | POA: Diagnosis not present

## 2017-03-11 DIAGNOSIS — C4371 Malignant melanoma of right lower limb, including hip: Secondary | ICD-10-CM | POA: Diagnosis not present

## 2017-03-15 DIAGNOSIS — C4371 Malignant melanoma of right lower limb, including hip: Secondary | ICD-10-CM | POA: Diagnosis not present

## 2017-03-15 DIAGNOSIS — F332 Major depressive disorder, recurrent severe without psychotic features: Secondary | ICD-10-CM | POA: Diagnosis not present

## 2017-03-17 DIAGNOSIS — C4371 Malignant melanoma of right lower limb, including hip: Secondary | ICD-10-CM | POA: Diagnosis not present

## 2017-03-18 DIAGNOSIS — Z1159 Encounter for screening for other viral diseases: Secondary | ICD-10-CM | POA: Diagnosis not present

## 2017-03-18 DIAGNOSIS — G20 Parkinson's disease: Secondary | ICD-10-CM | POA: Diagnosis not present

## 2017-03-18 DIAGNOSIS — Z79899 Other long term (current) drug therapy: Secondary | ICD-10-CM | POA: Diagnosis not present

## 2017-03-18 DIAGNOSIS — F319 Bipolar disorder, unspecified: Secondary | ICD-10-CM | POA: Diagnosis not present

## 2017-03-18 DIAGNOSIS — C4371 Malignant melanoma of right lower limb, including hip: Secondary | ICD-10-CM | POA: Diagnosis not present

## 2017-03-25 DIAGNOSIS — F329 Major depressive disorder, single episode, unspecified: Secondary | ICD-10-CM | POA: Diagnosis not present

## 2017-03-25 DIAGNOSIS — I10 Essential (primary) hypertension: Secondary | ICD-10-CM | POA: Diagnosis not present

## 2017-03-25 DIAGNOSIS — Z79899 Other long term (current) drug therapy: Secondary | ICD-10-CM | POA: Diagnosis not present

## 2017-03-25 DIAGNOSIS — Z806 Family history of leukemia: Secondary | ICD-10-CM | POA: Diagnosis not present

## 2017-03-25 DIAGNOSIS — C4371 Malignant melanoma of right lower limb, including hip: Secondary | ICD-10-CM | POA: Diagnosis not present

## 2017-03-25 DIAGNOSIS — G20 Parkinson's disease: Secondary | ICD-10-CM | POA: Diagnosis not present

## 2017-03-25 DIAGNOSIS — Z5111 Encounter for antineoplastic chemotherapy: Secondary | ICD-10-CM | POA: Diagnosis not present

## 2017-04-15 DIAGNOSIS — C4371 Malignant melanoma of right lower limb, including hip: Secondary | ICD-10-CM | POA: Diagnosis not present

## 2017-04-15 DIAGNOSIS — G20 Parkinson's disease: Secondary | ICD-10-CM | POA: Diagnosis not present

## 2017-04-15 DIAGNOSIS — Z79899 Other long term (current) drug therapy: Secondary | ICD-10-CM | POA: Diagnosis not present

## 2017-04-19 DIAGNOSIS — R600 Localized edema: Secondary | ICD-10-CM | POA: Diagnosis not present

## 2017-04-19 DIAGNOSIS — Z79899 Other long term (current) drug therapy: Secondary | ICD-10-CM | POA: Diagnosis not present

## 2017-04-19 DIAGNOSIS — C4371 Malignant melanoma of right lower limb, including hip: Secondary | ICD-10-CM | POA: Diagnosis not present

## 2017-04-19 DIAGNOSIS — C439 Malignant melanoma of skin, unspecified: Secondary | ICD-10-CM | POA: Diagnosis not present

## 2017-04-29 DIAGNOSIS — C4371 Malignant melanoma of right lower limb, including hip: Secondary | ICD-10-CM | POA: Diagnosis not present

## 2017-04-29 DIAGNOSIS — G20 Parkinson's disease: Secondary | ICD-10-CM | POA: Diagnosis not present

## 2017-04-29 DIAGNOSIS — Z79899 Other long term (current) drug therapy: Secondary | ICD-10-CM | POA: Diagnosis not present

## 2017-05-10 DIAGNOSIS — F332 Major depressive disorder, recurrent severe without psychotic features: Secondary | ICD-10-CM | POA: Diagnosis not present

## 2017-05-13 ENCOUNTER — Ambulatory Visit: Payer: Medicare Other | Admitting: Neurology

## 2017-05-13 DIAGNOSIS — M7989 Other specified soft tissue disorders: Secondary | ICD-10-CM | POA: Diagnosis not present

## 2017-05-13 DIAGNOSIS — C4371 Malignant melanoma of right lower limb, including hip: Secondary | ICD-10-CM | POA: Diagnosis not present

## 2017-05-13 DIAGNOSIS — R110 Nausea: Secondary | ICD-10-CM | POA: Diagnosis not present

## 2017-05-13 DIAGNOSIS — R600 Localized edema: Secondary | ICD-10-CM | POA: Diagnosis not present

## 2017-05-13 DIAGNOSIS — G20 Parkinson's disease: Secondary | ICD-10-CM | POA: Diagnosis not present

## 2017-05-13 DIAGNOSIS — Z79899 Other long term (current) drug therapy: Secondary | ICD-10-CM | POA: Diagnosis not present

## 2017-05-27 DIAGNOSIS — R600 Localized edema: Secondary | ICD-10-CM | POA: Diagnosis not present

## 2017-05-27 DIAGNOSIS — C4371 Malignant melanoma of right lower limb, including hip: Secondary | ICD-10-CM | POA: Diagnosis not present

## 2017-05-27 DIAGNOSIS — Z79899 Other long term (current) drug therapy: Secondary | ICD-10-CM | POA: Diagnosis not present

## 2017-05-27 DIAGNOSIS — Z5111 Encounter for antineoplastic chemotherapy: Secondary | ICD-10-CM | POA: Diagnosis not present

## 2017-05-27 DIAGNOSIS — Z9889 Other specified postprocedural states: Secondary | ICD-10-CM | POA: Diagnosis not present

## 2017-05-27 DIAGNOSIS — G20 Parkinson's disease: Secondary | ICD-10-CM | POA: Diagnosis not present
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
## 2017-06-08 ENCOUNTER — Ambulatory Visit (INDEPENDENT_AMBULATORY_CARE_PROVIDER_SITE_OTHER): Payer: Medicare Other | Admitting: Neurology

## 2017-06-08 ENCOUNTER — Encounter: Payer: Self-pay | Admitting: Neurology

## 2017-06-08 VITALS — BP 130/80 | HR 78 | Wt 291.1 lb

## 2017-06-08 DIAGNOSIS — G20 Parkinson's disease: Secondary | ICD-10-CM | POA: Diagnosis not present

## 2017-06-08 DIAGNOSIS — F331 Major depressive disorder, recurrent, moderate: Secondary | ICD-10-CM

## 2017-06-08 DIAGNOSIS — C4371 Malignant melanoma of right lower limb, including hip: Secondary | ICD-10-CM

## 2017-06-08 MED ORDER — CARBIDOPA-LEVODOPA ER 50-200 MG PO TBCR: 1.0000 | EXTENDED_RELEASE_TABLET | Freq: Every day | ORAL | 5 refills | Status: DC

## 2017-06-08 NOTE — Patient Instructions (Addendum)
Here are some primary care physician names, as you requested  1. Arnette Norris ([place])  2.  Howard Pouch (this is [facility] and not that far from you)  Phone: [phone]  3.  Camille Andy/Kate Tabori ([place])  2.  Continue pramipexole 1 mg three times per day and carbidopa/levodopa 25/100 three times per day  3.  ADD carbidopa/levodopa 50/200 30 minutes to one hour before bedtime

## 2017-06-10 ENCOUNTER — Telehealth: Payer: Self-pay | Admitting: Psychology

## 2017-06-10 NOTE — Telephone Encounter (Signed)
Left a message introducing myself and asking patient to call me back. I was not able to meet him while he was in the clinic.

## 2017-06-17 DIAGNOSIS — Z5111 Encounter for antineoplastic chemotherapy: Secondary | ICD-10-CM | POA: Diagnosis not present

## 2017-06-17 DIAGNOSIS — M7989 Other specified soft tissue disorders: Secondary | ICD-10-CM | POA: Diagnosis not present

## 2017-06-17 DIAGNOSIS — Z79899 Other long term (current) drug therapy: Secondary | ICD-10-CM | POA: Diagnosis not present

## 2017-06-17 DIAGNOSIS — C4371 Malignant melanoma of right lower limb, including hip: Secondary | ICD-10-CM | POA: Diagnosis not present

## 2017-06-17 DIAGNOSIS — G20 Parkinson's disease: Secondary | ICD-10-CM | POA: Diagnosis not present

## 2017-06-17 DIAGNOSIS — R112 Nausea with vomiting, unspecified: Secondary | ICD-10-CM | POA: Diagnosis not present

## 2017-06-21 DIAGNOSIS — R9389 Abnormal findings on diagnostic imaging of other specified body structures: Secondary | ICD-10-CM | POA: Diagnosis not present

## 2017-06-21 DIAGNOSIS — C4371 Malignant melanoma of right lower limb, including hip: Secondary | ICD-10-CM | POA: Diagnosis not present

## 2017-07-02 DIAGNOSIS — Z85820 Personal history of malignant melanoma of skin: Secondary | ICD-10-CM | POA: Diagnosis not present

## 2017-07-02 DIAGNOSIS — D2271 Melanocytic nevi of right lower limb, including hip: Secondary | ICD-10-CM | POA: Diagnosis not present

## 2017-07-02 DIAGNOSIS — D1801 Hemangioma of skin and subcutaneous tissue: Secondary | ICD-10-CM | POA: Diagnosis not present

## 2017-07-02 DIAGNOSIS — L905 Scar conditions and fibrosis of skin: Secondary | ICD-10-CM | POA: Diagnosis not present

## 2017-07-02 DIAGNOSIS — L986 Other infiltrative disorders of the skin and subcutaneous tissue: Secondary | ICD-10-CM | POA: Diagnosis not present

## 2017-07-02 DIAGNOSIS — D485 Neoplasm of uncertain behavior of skin: Secondary | ICD-10-CM | POA: Diagnosis not present

## 2017-07-19 DIAGNOSIS — J358 Other chronic diseases of tonsils and adenoids: Secondary | ICD-10-CM | POA: Diagnosis not present

## 2017-07-22 DIAGNOSIS — J358 Other chronic diseases of tonsils and adenoids: Secondary | ICD-10-CM | POA: Diagnosis not present

## 2017-07-22 DIAGNOSIS — M47819 Spondylosis without myelopathy or radiculopathy, site unspecified: Secondary | ICD-10-CM | POA: Diagnosis not present

## 2017-08-02 DIAGNOSIS — F332 Major depressive disorder, recurrent severe without psychotic features: Secondary | ICD-10-CM | POA: Diagnosis not present

## 2017-08-03 DIAGNOSIS — M7989 Other specified soft tissue disorders: Secondary | ICD-10-CM | POA: Diagnosis not present

## 2017-08-03 DIAGNOSIS — Z79899 Other long term (current) drug therapy: Secondary | ICD-10-CM | POA: Diagnosis not present

## 2017-08-03 DIAGNOSIS — C4371 Malignant melanoma of right lower limb, including hip: Secondary | ICD-10-CM | POA: Diagnosis not present

## 2017-08-03 DIAGNOSIS — J358 Other chronic diseases of tonsils and adenoids: Secondary | ICD-10-CM | POA: Diagnosis not present

## 2017-08-20 ENCOUNTER — Other Ambulatory Visit: Payer: Self-pay | Admitting: Neurology

## 2017-08-25 DIAGNOSIS — S81801A Unspecified open wound, right lower leg, initial encounter: Secondary | ICD-10-CM | POA: Diagnosis not present

## 2017-09-10 DIAGNOSIS — C4371 Malignant melanoma of right lower limb, including hip: Secondary | ICD-10-CM | POA: Diagnosis not present

## 2017-09-14 DIAGNOSIS — Z79899 Other long term (current) drug therapy: Secondary | ICD-10-CM | POA: Diagnosis not present

## 2017-09-14 DIAGNOSIS — J358 Other chronic diseases of tonsils and adenoids: Secondary | ICD-10-CM | POA: Diagnosis not present

## 2017-09-14 DIAGNOSIS — G20 Parkinson's disease: Secondary | ICD-10-CM | POA: Diagnosis not present

## 2017-09-14 DIAGNOSIS — C4371 Malignant melanoma of right lower limb, including hip: Secondary | ICD-10-CM | POA: Diagnosis not present

## 2017-09-14 DIAGNOSIS — R531 Weakness: Secondary | ICD-10-CM | POA: Diagnosis not present

## 2017-09-14 DIAGNOSIS — M7989 Other specified soft tissue disorders: Secondary | ICD-10-CM | POA: Diagnosis not present

## 2017-09-20 DIAGNOSIS — J358 Other chronic diseases of tonsils and adenoids: Secondary | ICD-10-CM | POA: Diagnosis not present
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
## 2017-10-12 ENCOUNTER — Ambulatory Visit: Payer: 59 | Admitting: Neurology

## 2017-10-12 ENCOUNTER — Encounter

## 2017-10-13 ENCOUNTER — Encounter: Payer: Self-pay | Admitting: Neurology

## 2017-10-13 ENCOUNTER — Ambulatory Visit (INDEPENDENT_AMBULATORY_CARE_PROVIDER_SITE_OTHER): Payer: Medicare Other | Admitting: Neurology

## 2017-10-13 ENCOUNTER — Other Ambulatory Visit: Payer: Self-pay | Admitting: *Deleted

## 2017-10-13 VITALS — BP 140/90 | HR 81 | Ht 73.0 in | Wt 295.5 lb

## 2017-10-13 DIAGNOSIS — G8929 Other chronic pain: Secondary | ICD-10-CM

## 2017-10-13 DIAGNOSIS — F331 Major depressive disorder, recurrent, moderate: Secondary | ICD-10-CM | POA: Diagnosis not present

## 2017-10-13 DIAGNOSIS — M545 Low back pain: Secondary | ICD-10-CM

## 2017-10-13 DIAGNOSIS — G20 Parkinson's disease: Secondary | ICD-10-CM

## 2017-10-13 NOTE — Patient Instructions (Signed)
-increase carbidopa/levodopa 25/100, 1 tablet at 8am/11am/3pm/7pm.   Continue pramipexole, 1.0 mg three times per day.  Exercise!    Community Parkinson's Exercise Programs   Parkinson's Wellness Recovery Exercise Programs:   PWR! Moves PD Exercise Class:  This is a therapist-led exercise class for people with Parkinson's disease in the [place] community. It consists of a one-hour exercise class each week. Classes are offered in eight-week sessions, and the cost per session is $80. Class size is limited to a maximum of 20 participants. Participant criteria includes: Participant must be able to get up and down from the floor with minimal to no assistance, have had 0-1 falls in the past 6 months, and have completed physical or occupational therapy at [facility] within the past year.  To find out more about session dates, questions, or to register, please contact Mady Haagensen, Physical Therapist, or Nita Sells, Physical Brewing technologist, at [facility] at [phone].  PWR! Circuit Class:  This is a therapist-led exercise class with intervals of circuit activities incorporating PWR! Moves into functional activities. It consists of one 45-minute exercise class per week. Classes are offered in eight-week sessions, and the cost per session is $120. Class size is limited to a maximum of eight participants to allow for hands-on instruction. Participant criteria: class is ideal for people with Parkinson's disease who have completed PWR! Moves Exercise Class or who are currently independently exercising and want to be challenged, must be able to walk independently with 0-1 falls in the past 6 months, able to get up and down from the floor independently, able to sit to stand independently, and able to jog 20 feet.   To find out more about session dates, questions, or to register, please contact Mady Haagensen, Physical Therapist, or Nita Sells, Physical Brewing technologist, at [facility] at [phone].   [employer] Parkinson's Cycle:   Parkinson's Cycle Class at [facility] This is an ongoing class on Monday and Thursday mornings at 10:45 a.m. A healthcare provider referral is required to enroll. This class is FREE to participants, and you do not have to be a member of the [employer] to enroll. Contact Beth at [phone] or [email or web address]. Parkinson's Cycle Class at [facility] Ongoing Class Monday, Wednesday, and Friday mornings at 9:00 a.m. A healthcare provider referral is required to enroll. This class is FREE to participants, and you do not have to be a member of the [employer] to enroll. Contact Marlee at [phone] or [email or web address]. Parkinson's Cycle Class at [facility] Ongoing Class every Friday mornings at 12 p.m.  A healthcare provider referral is required to enroll. This class is FREE to participants, and you do not have to be a member of the [employer] to enroll. Contact [phone].  Parkinson's Cycle Class at [facility] Ongoing Class every Monday at 12pm.  A healthcare provider referral is required to enroll. This class is FREE to participants, and you do not have to be a member of the [employer] to enroll. Contact Almyra Free at [phone] or  [email or web address].   [employer]:  [employer]  Classes are offered Mondays at 5:15 p.m. and Tuesdays and Thursdays at 12 p.m. at [employer]. For more information, contact [phone] or visit [email or web address] or [email or web address]. [employer] [place] Classes are offered Monday, Wednesday, and Friday from 9:30 a.m. - 11:00 a.m. For more information, contact [phone] or [phone] or email [email or web address] or visit [email or web address]  or [email or web address]. [employer] (classes are offered at 2 locations) . [facility] [facility] [employer] in [place] (for more information, contact [place] at [phone] or email [email or web address] . Cathren Laine at [facility] (class is open to the public -- for more information, contact Clabe Seal at [phone] or email [email or web address]) [place] are held at [facility] in [place], [place]. For more information, call Dr. Bing Plume at [phone] or [email or web address].   Personal Training for Parkinson's:   [employer] Offers certified personal training to customize a program to meet your exercise needs to address Parkinson's disease. For more information, contact [phone] or visit [email or web address].  Community Dance for Parkinson's:   Community dance class for people with Parkinson's Disease Wednesdays at 9 a.m. [employer] [place], [place] [address] Please contact Eliberto Ivory [phone] for more information  Scholarships Available for Fitness Programs:  The [employer] is a non-profit 501(C)3 organization run by volunteers, whose mission is to strive to empower those living with Parkinson's Disease (PD), Progressive Supra-Nuclear Palsy (PSP) and Multiple System Atrophy ([place]).  Through financial support, recipients benefit from individual and group programs. [phone] [email or web address]

## 2017-10-25 DIAGNOSIS — F332 Major depressive disorder, recurrent severe without psychotic features: Secondary | ICD-10-CM | POA: Diagnosis not present

## 2017-10-27 DIAGNOSIS — R600 Localized edema: Secondary | ICD-10-CM | POA: Diagnosis not present

## 2017-10-27 DIAGNOSIS — D1801 Hemangioma of skin and subcutaneous tissue: Secondary | ICD-10-CM | POA: Diagnosis not present

## 2017-10-27 DIAGNOSIS — L986 Other infiltrative disorders of the skin and subcutaneous tissue: Secondary | ICD-10-CM | POA: Diagnosis not present

## 2017-10-27 DIAGNOSIS — L814 Other melanin hyperpigmentation: Secondary | ICD-10-CM | POA: Diagnosis not present

## 2017-10-27 DIAGNOSIS — L821 Other seborrheic keratosis: Secondary | ICD-10-CM | POA: Diagnosis not present

## 2017-10-27 DIAGNOSIS — L905 Scar conditions and fibrosis of skin: Secondary | ICD-10-CM | POA: Diagnosis not present

## 2017-10-28 DIAGNOSIS — J358 Other chronic diseases of tonsils and adenoids: Secondary | ICD-10-CM | POA: Diagnosis not present

## 2017-11-05 ENCOUNTER — Other Ambulatory Visit: Payer: Self-pay

## 2017-11-17 DIAGNOSIS — M459 Ankylosing spondylitis of unspecified sites in spine: Secondary | ICD-10-CM | POA: Diagnosis not present

## 2017-11-17 DIAGNOSIS — Z1329 Encounter for screening for other suspected endocrine disorder: Secondary | ICD-10-CM | POA: Diagnosis not present

## 2017-11-17 DIAGNOSIS — E559 Vitamin D deficiency, unspecified: Secondary | ICD-10-CM | POA: Diagnosis not present

## 2017-11-17 DIAGNOSIS — Z23 Encounter for immunization: Secondary | ICD-10-CM | POA: Diagnosis not present

## 2017-11-17 DIAGNOSIS — G20 Parkinson's disease: Secondary | ICD-10-CM | POA: Diagnosis not present

## 2017-11-17 DIAGNOSIS — R82998 Other abnormal findings in urine: Secondary | ICD-10-CM | POA: Diagnosis not present

## 2017-11-17 DIAGNOSIS — E538 Deficiency of other specified B group vitamins: Secondary | ICD-10-CM | POA: Diagnosis not present

## 2017-11-17 DIAGNOSIS — R600 Localized edema: Secondary | ICD-10-CM | POA: Diagnosis not present

## 2017-11-17 DIAGNOSIS — Z6839 Body mass index (BMI) 39.0-39.9, adult: Secondary | ICD-10-CM | POA: Diagnosis not present

## 2017-11-17 DIAGNOSIS — F319 Bipolar disorder, unspecified: Secondary | ICD-10-CM | POA: Diagnosis not present

## 2017-11-17 DIAGNOSIS — E7849 Other hyperlipidemia: Secondary | ICD-10-CM | POA: Diagnosis not present

## 2017-11-17 DIAGNOSIS — K5909 Other constipation: Secondary | ICD-10-CM | POA: Diagnosis not present

## 2017-11-17 DIAGNOSIS — I10 Essential (primary) hypertension: Secondary | ICD-10-CM | POA: Diagnosis not present

## 2017-11-29 ENCOUNTER — Telehealth: Payer: Self-pay | Admitting: Neurology

## 2017-11-29 MED ORDER — CARBIDOPA-LEVODOPA 25-100 MG PO TABS: 1.0000 | ORAL_TABLET | Freq: Four times a day (QID) | ORAL | 1 refills | Status: DC

## 2017-11-29 NOTE — Telephone Encounter (Signed)
Pharmacy changed. RX sent.

## 2017-11-29 NOTE — Telephone Encounter (Signed)
Patient's wife is calling in stating that they need a new prescription for the Cardipoda Levodopa  25-100mg  changed where he is taking 4 per day. This needs to be sent to the [facility] in [place] on [address]. Also can you update that to the pharmacy in the system as he has changed for good to the [facility]. Thanks!

## 2017-12-06 ENCOUNTER — Other Ambulatory Visit: Payer: Self-pay

## 2017-12-06 ENCOUNTER — Ambulatory Visit (HOSPITAL_BASED_OUTPATIENT_CLINIC_OR_DEPARTMENT_OTHER): Payer: Medicare Other | Admitting: Physical Medicine & Rehabilitation

## 2017-12-06 ENCOUNTER — Encounter: Payer: Self-pay | Admitting: Physical Medicine & Rehabilitation

## 2017-12-06 ENCOUNTER — Encounter: Payer: Medicare Other | Attending: Physical Medicine & Rehabilitation

## 2017-12-06 VITALS — BP 116/71 | HR 75 | Ht 70.25 in | Wt 285.0 lb

## 2017-12-06 DIAGNOSIS — M545 Low back pain, unspecified: Secondary | ICD-10-CM | POA: Insufficient documentation

## 2017-12-06 DIAGNOSIS — E785 Hyperlipidemia, unspecified: Secondary | ICD-10-CM | POA: Insufficient documentation

## 2017-12-06 DIAGNOSIS — G20 Parkinson's disease: Secondary | ICD-10-CM | POA: Insufficient documentation

## 2017-12-06 DIAGNOSIS — G8929 Other chronic pain: Secondary | ICD-10-CM | POA: Insufficient documentation

## 2017-12-06 DIAGNOSIS — M961 Postlaminectomy syndrome, not elsewhere classified: Secondary | ICD-10-CM | POA: Insufficient documentation

## 2017-12-06 DIAGNOSIS — F319 Bipolar disorder, unspecified: Secondary | ICD-10-CM | POA: Insufficient documentation

## 2017-12-06 DIAGNOSIS — I10 Essential (primary) hypertension: Secondary | ICD-10-CM | POA: Insufficient documentation

## 2017-12-06 MED ORDER — METHOCARBAMOL 500 MG PO TABS: 500.0000 mg | ORAL_TABLET | Freq: Three times a day (TID) | ORAL | 1 refills | Status: DC | PRN

## 2017-12-06 NOTE — Patient Instructions (Signed)
Please call Dr Rolena Infante about your neck pain

## 2017-12-06 NOTE — Progress Notes (Signed)
Subjective:    Patient ID: Bill Guerrero, male    DOB: [birth year], 67 y.o.   MRN: [id number]  HPI   CC Low back pain around the waist  67yo male with hx of Parkinson's disease and malignant melanoma Who is referred by Dr [name] for the evaluation of low back pain  Patient has had this pain for several years.  He feels like his pain increased with mobility changes due to his Parkinson's disease.  He had been previously doing well after his spine surgeries.  He has had no  Pt has gained weight with his Parkinson's disease diagnosed in 2015 due to decreased activity level.  Prior hx of Lumbar surgery but pt does not recall year, Dr Tonita Cong and Dr Rolena Infante 2008, patient does not remember which level his surgery was on what type of spine surgery it was.  PMH + Bipolar disorder treated by psychiatry  PSH- Lumbar fusion 2008, Left THA , ACDF by hx has occ severe neck pain Pain Inventory Average Pain 8 Pain Right Now 6 My pain is sharp and burning  In the last 24 hours, has pain interfered with the following? General activity 7 Relation with others 7 Enjoyment of life 8 What TIME of day is your pain at its worst? evening Sleep (in general) Poor  Pain is worse with: standing and some activites Pain improves with: none Relief from Meds: 0  Mobility walk with assistance use a cane do you drive?  yes  Function retired I need assistance with the following:  household duties  Neuro/Psych weakness tremor trouble walking dizziness confusion depression anxiety  Prior Studies Any changes since last visit?  no  Physicians involved in your care Any changes since last visit?  no   No family history on file. Social History   Socioeconomic History  . Marital status: Married    Spouse name: Not on file  . Number of children: Not on file  . Years of education: Not on file  . Highest education level: Not on file  Occupational History  . Not on file  Social Needs  .  Financial resource strain: Not on file  . Food insecurity:    Worry: Not on file    Inability: Not on file  . Transportation needs:    Medical: Not on file    Non-medical: Not on file  Tobacco Use  . Smoking status: Never Smoker  . Smokeless tobacco: Never Used  Substance and Sexual Activity  . Alcohol use: No    Alcohol/week: 1.0 standard drinks    Types: 1 Glasses of wine per week    Comment: no alcohol in 2 years  . Drug use: No  . Sexual activity: Yes  Lifestyle  . Physical activity:    Days per week: Not on file    Minutes per session: Not on file  . Stress: Not on file  Relationships  . Social connections:    Talks on phone: Not on file    Gets together: Not on file    Attends religious service: Not on file    Active member of club or organization: Not on file    Attends meetings of clubs or organizations: Not on file    Relationship status: Not on file  Other Topics Concern  . Not on file  Social History Narrative  . Not on file   Past Surgical History:  Procedure Laterality Date  . BACK SURGERY     CERVICAL AND LUMBAR  .  GROIN MASS OPEN BIOPSY     MASS REMOVED FROM GROIN  . HEMORROIDECTOMY    . HERNIA REPAIR     BIL IN HERNIA  . JOINT REPLACEMENT     TOTAL LEFT HIP  . PENILE PROSTHESIS IMPLANT N/A 06/17/2012   Procedure: THREE PIECE PENILE PROTHESIS INFLATABLE-COLOPLAST (PENILE SCROTAL APPROACH);  Surgeon: Fredricka Bonine, MD;  Location: [facility];  Service: Urology;  Laterality: N/A;  . PENILE PROSTHESIS IMPLANT N/A 10/07/2012   Procedure: PENILE PROTHESIS REMOVAL AND REPLACEMENT INFLATABLE, PENILE SCROTAL APPROACH;  Surgeon: Fredricka Bonine, MD;  Location: [facility];  Service: Urology;  Laterality: N/A;  . PILONIDAL CYST EXCISION  1986  . ROTATOR CUFF REPAIR     RIGHT  . VASECTOMY Bilateral 06/17/2012   Procedure: VASECTOMY;  Surgeon: Fredricka Bonine, MD;  Location: [facility];  Service: Urology;  Laterality: Bilateral;   Past Medical  History:  Diagnosis Date  . Arthritis    hips replacement  . Bipolar 1 disorder ([place])   . Hyperlipemia   . Hypertension   . Infection of prosthesis ([place])    penile implant with abscess with drainage of scrotum -"pinkish tan""no odor"  . Multiple body piercings   . Tremor    RT HAND   BP 116/71   Pulse 75   Ht 5' 10.25" (1.784 m)   Wt 285 lb (129.3 kg)   SpO2 95%   BMI 40.60 kg/m   Opioid Risk Score:   Fall Risk Score:  `1  Depression screen PHQ 2/9  Depression screen PHQ 2/9 12/06/2017  Decreased Interest 3  Down, Depressed, Hopeless 2  PHQ - 2 Score 5  Altered sleeping 1  Tired, decreased energy 3  Change in appetite 2  Feeling bad or failure about yourself  1  Trouble concentrating 3  Moving slowly or fidgety/restless 2  Suicidal thoughts 0  PHQ-9 Score 17  Difficult doing work/chores Very difficult    Review of Systems  Constitutional: Positive for unexpected weight change.  Eyes: Negative.   Respiratory: Positive for shortness of breath.   Cardiovascular: Positive for leg swelling.  Gastrointestinal: Positive for constipation.  Endocrine: Negative.   Genitourinary: Negative.   Musculoskeletal: Negative.   Skin: Negative.   Allergic/Immunologic: Negative.   Neurological: Negative.   Hematological: Negative.   Psychiatric/Behavioral: Negative.   All other systems reviewed and are negative.      Objective:   Physical Exam  Constitutional: He is oriented to person, place, and time. He appears well-developed and well-nourished. No distress.  HENT:  Head: Normocephalic and atraumatic.  Eyes: Pupils are equal, round, and reactive to light. EOM are normal.  Neck: Normal range of motion.  Cardiovascular: Normal rate, regular rhythm and normal heart sounds.  No murmur heard. Pulmonary/Chest: Effort normal and breath sounds normal. No respiratory distress.  Abdominal: Soft. Bowel sounds are normal. He exhibits no distension.  Musculoskeletal: Normal range  of motion.  Neurological: He is alert and oriented to person, place, and time.  Skin: Skin is warm and dry. He is not diaphoretic.  Psychiatric: He has a normal mood and affect.  Nursing note and vitals reviewed.         Assessment & Plan:  1.  Chronic low back pain he has lumbar postlaminectomy syndrome but was doing well postoperatively and told his mobility diminished as result of advancing Parkinson's disease.  He has also gained weight. He has had some problems with balance as well.  No falls that have  necessitated ER visits but has had falls. I will send him to physical therapy and reevaluate in 4 to 6 weeks.  If not doing any better I would order lumbar x-rays. 2.  Episodic twinges of severe neck pain, he has not followed up with his surgeon Dr. Rolena Infante I have asked him to do so any stated he will call the office.  I do not see any sign of cervical radiculopathy at this time. These twinges may represent muscle spasm since 17 to come and go.  I have written some Robaxin to see if this would be helpful.  I cautioned him not to take more than 1 a day given his age and comorbidities, we discussed that this could contribute to increased fall risk.

## 2017-12-08 DIAGNOSIS — Z23 Encounter for immunization: Secondary | ICD-10-CM | POA: Diagnosis not present

## 2017-12-29 DIAGNOSIS — Z85820 Personal history of malignant melanoma of skin: Secondary | ICD-10-CM | POA: Diagnosis not present

## 2017-12-29 DIAGNOSIS — L219 Seborrheic dermatitis, unspecified: Secondary | ICD-10-CM | POA: Diagnosis not present

## 2017-12-29 DIAGNOSIS — D229 Melanocytic nevi, unspecified: Secondary | ICD-10-CM | POA: Diagnosis not present

## 2017-12-29 DIAGNOSIS — R600 Localized edema: Secondary | ICD-10-CM | POA: Diagnosis not present

## 2018-01-04 ENCOUNTER — Ambulatory Visit: Payer: Medicare Other | Attending: Physical Medicine & Rehabilitation | Admitting: Physical Therapy

## 2018-01-04 ENCOUNTER — Encounter: Payer: Self-pay | Admitting: Physical Therapy

## 2018-01-04 ENCOUNTER — Other Ambulatory Visit: Payer: Self-pay

## 2018-01-04 ENCOUNTER — Ambulatory Visit: Payer: 59 | Admitting: Physical Medicine & Rehabilitation

## 2018-01-04 ENCOUNTER — Encounter: Payer: Self-pay | Admitting: Emergency Medicine

## 2018-01-04 DIAGNOSIS — R296 Repeated falls: Secondary | ICD-10-CM | POA: Insufficient documentation

## 2018-01-04 DIAGNOSIS — M545 Low back pain: Secondary | ICD-10-CM | POA: Insufficient documentation

## 2018-01-04 DIAGNOSIS — R262 Difficulty in walking, not elsewhere classified: Secondary | ICD-10-CM | POA: Insufficient documentation

## 2018-01-04 DIAGNOSIS — M6281 Muscle weakness (generalized): Secondary | ICD-10-CM | POA: Insufficient documentation

## 2018-01-04 DIAGNOSIS — G8929 Other chronic pain: Secondary | ICD-10-CM

## 2018-01-04 DIAGNOSIS — R2681 Unsteadiness on feet: Secondary | ICD-10-CM | POA: Diagnosis not present

## 2018-01-04 NOTE — Therapy (Signed)
[facility] [address]  [place], [place], [address] Phone: [phone]   Fax:  [phone]  Physical Therapy Evaluation  Patient Details  Name: Bill Guerrero MRN: [id number] Date of Birth: 68-Jan-1952 Referring Provider (PT): Alysia Penna, MD   Encounter Date: 01/04/2018  PT End of Session - 01/04/18 1423    Visit Number  1    Number of Visits  17    Date for PT Re-Evaluation  03/01/18    Authorization Type  Medicare & Aetna    PT Start Time  1316    PT Stop Time  1407    PT Time Calculation (min)  51 min    Activity Tolerance  Patient tolerated treatment well    Behavior During Therapy  [facility] for tasks assessed/performed       Past Medical History:  Diagnosis Date  . Arthritis    hips replacement  . Bipolar 1 disorder ([place])   . Hyperlipemia   . Hypertension   . Infection of prosthesis ([place])    penile implant with abscess with drainage of scrotum -"pinkish tan""no odor"  . Multiple body piercings   . Tremor    RT HAND    Past Surgical History:  Procedure Laterality Date  . BACK SURGERY     CERVICAL AND LUMBAR  . GROIN MASS OPEN BIOPSY     MASS REMOVED FROM GROIN  . HEMORROIDECTOMY    . HERNIA REPAIR     BIL IN HERNIA  . JOINT REPLACEMENT     TOTAL LEFT HIP  . PENILE PROSTHESIS IMPLANT N/A 06/17/2012   Procedure: THREE PIECE PENILE PROTHESIS INFLATABLE-COLOPLAST (PENILE SCROTAL APPROACH);  Surgeon: Fredricka Bonine, MD;  Location: [facility];  Service: Urology;  Laterality: N/A;  . PENILE PROSTHESIS IMPLANT N/A 10/07/2012   Procedure: PENILE PROTHESIS REMOVAL AND REPLACEMENT INFLATABLE, PENILE SCROTAL APPROACH;  Surgeon: Fredricka Bonine, MD;  Location: [facility];  Service: Urology;  Laterality: N/A;  . PILONIDAL CYST EXCISION  1986  . ROTATOR CUFF REPAIR     RIGHT  . VASECTOMY Bilateral 06/17/2012   Procedure: VASECTOMY;  Surgeon: Fredricka Bonine, MD;  Location: [facility];   Service: Urology;  Laterality: Bilateral;    There were no vitals filed for this visit.   Subjective Assessment - 01/04/18 1317    Subjective  Has noticed that his posture has declined. Has also started falling- most times it occurs when sleep walking d/t Ambien and tends to fall backwards. Had 6-8 falls in the last 6 months. Having trouble with stair climbing and festinating at doorways. Hand tremor R>L but this is not bothering him too much at this time. Had lumbar and cervical fusion in 2008 with resolution of symptoms, recent flare up of L LBP for the past 1.5-2 years. Aggravating factors include standing at sink, vacuuming, prolonged walking. Pain location similar to pain before his surgeries. Denies radiation or N/T. Reports that he has been seeking treatment for malignant melanoma in R LE and has been treated with T-VEC injection therapy clinical trial. Was given a poor prognosis, however now in remission. Patient reports he used to be very athletic and would like to get back on a bike again. Also would like to get into [employer] steady boxing.  Walking with a SPC, and using rolling walker for walks.     Pertinent History  Parkinson's disease, malignant melanoma, lumbar fusion 2008, L THA, ACDF, R RTC repair, bipolar disorder, HLD,  HTN    Limitations  Sitting;Lifting;Standing;Walking;House hold activities    How long can you sit comfortably?  2 hours    How long can you stand comfortably?  30 min    How long can you walk comfortably?  10 min    Diagnostic tests  none recent    Patient Stated Goals  join rocksteady boxing and get back on a bike again    Currently in Pain?  Yes    Pain Location  Back    Pain Orientation  Left;Posterior    Pain Descriptors / Indicators  Sharp    Pain Type  Chronic pain    Aggravating Factors   prolonged standing, walking    Pain Relieving Factors  flexion         [facility] PT Assessment - 01/04/18 1334      Assessment   Medical Diagnosis  Chronic midline LBP  without sciatica, Lumbar post-laminectomy syndrome, cervical post-laminectomy syndrome, Parkinson's    Referring Provider (PT)  Alysia Penna, MD    Onset Date/Surgical Date  --   1.5-2 years   Next MD Visit  --   not scheduled   Prior Therapy  No      Precautions   Precautions  Fall   malignant melanoma in remission, lumbar fusion, ACDF     Balance Screen   Has the patient fallen in the past 6 months  Yes    How many times?  6-8   sleepwalking, falling backwards   Has the patient had a decrease in activity level because of a fear of falling?   Yes    Is the patient reluctant to leave their home because of a fear of falling?   No      Home Environment   Living Environment  Private residence    Living Arrangements  Spouse/significant other    Available Help at Discharge  Family    Type of [place] to enter    Entrance Stairs-Number of Steps  3-4    Entrance Stairs-Rails  Right;Left    Home Layout  Two level    Alternate Level Stairs-Number of Steps  15   very steep   Alternate Level Stairs-Rails  Right    Home Equipment  Walker - 4 wheels;Cane - single point;Shower seat;Grab bars - tub/shower      Prior Function   Level of Independence  Independent    Vocation  Retired    Leisure  biking      Cognition   Overall Cognitive Status  Within Functional Limits for tasks assessed      Observation/Other Assessments   Observations  R lower leg wrapped in ace bandage; R LE and foot grossely edematous; R hand resting tremor      Sensation   Light Touch  Appears Intact      Coordination   Gross Motor Movements are Fluid and Coordinated  No   segmental movements   Fine Motor Movements are Fluid and Coordinated  No   tremor in B hands     Posture/Postural Control   Posture/Postural Control  Postural limitations    Postural Limitations  Rounded Shoulders;Forward head;Weight shift right   R shoulder depressed   Posture Comments  standing posture-  forward flexion and R sidebending       ROM / Strength   AROM / PROM / Strength  AROM;Strength      AROM   AROM Assessment Site  Lumbar    Lumbar Flexion  mid shin    Lumbar Extension  moderately limited   limited d/t feeling off balance   Lumbar - Right Side Bend  distal thigh    Lumbar - Left Side Bend  distal thigh    Lumbar - Right Rotation  moderately limited    Lumbar - Left Rotation  moderately limited      Strength   Strength Assessment Site  Hip;Knee;Ankle    Right/Left Hip  Right;Left    Right Hip Flexion  4/5    Right Hip ABduction  4+/5    Right Hip ADduction  4/5    Left Hip Flexion  4+/5    Left Hip ABduction  4+/5    Left Hip ADduction  4/5    Right/Left Knee  Right;Left    Right Knee Flexion  4/5    Right Knee Extension  4/5    Left Knee Flexion  5/5    Left Knee Extension  5/5    Right/Left Ankle  Right;Left    Right Ankle Dorsiflexion  4+/5    Right Ankle Plantar Flexion  4+/5    Left Ankle Dorsiflexion  4+/5    Left Ankle Plantar Flexion  4+/5      Flexibility   Soft Tissue Assessment /Muscle Length  yes    Hamstrings  L HS mod tight, R HS mildly tight    Quadriceps  B hip flexor and quad severely tight      Palpation   Palpation comment  increased tone in B thoracolumbar paraspinals, no TPP; moderate L convex thoracolumbar scoliotic curve      Transfers   Transfers  Supine to Sit    Supine to Sit  4: Min guard   difficulty with rolling      Ambulation/Gait   Assistive device  Straight cane    Gait Pattern  Step-through pattern;Decreased step length - right;Decreased step length - left;Decreased dorsiflexion - right;Decreased dorsiflexion - left;Lateral trunk lean to left;Trunk flexed    Gait velocity  decreased                Objective measurements completed on examination: See above findings.              PT Education - 01/04/18 1423    Education Details  prognosis, POC, HEP; educated on performing scapular retraction  in sitting and hip flexor stretch and coutner top for safety    Person(s) Educated  Patient    Methods  Explanation;Demonstration;Tactile cues;Verbal cues;Handout    Comprehension  Verbalized understanding;Returned demonstration       PT Short Term Goals - 01/04/18 1432      PT SHORT TERM GOAL #1   Title  Patient to be independent with initial HEP.    Time  4    Period  Weeks    Status  New    Target Date  02/01/18        PT Long Term Goals - 01/04/18 1433      PT LONG TERM GOAL #1   Title  Patient to be independent with advanced HEP.    Time  8    Period  Weeks    Status  New    Target Date  03/01/18      PT LONG TERM GOAL #2   Title  Patient to demonstrate >=4+/5 strength in B LEs.    Time  8    Period  Weeks    Status  New    Target Date  03/01/18      PT LONG TERM GOAL #3   Title  Patient to score <14 sec on TUG testing with LRAD to decrease risk of falls.    Time  8    Period  Weeks    Status  New    Target Date  03/01/18      PT LONG TERM GOAL #4   Title  Patient to score <=16 sec on 5x STS without UEs to decrease risk of falls.    Time  8    Period  Weeks    Status  New    Target Date  03/01/18      PT LONG TERM GOAL #5   Title  Patient to join rocksteady boxing program or other community-based fitness program for maintenance of strength and endurance.     Time  8    Period  Weeks    Status  New    Target Date  03/01/18      Additional Long Term Goals   Additional Long Term Goals  Yes      PT LONG TERM GOAL #6   Title  Patient to report 75% improvement in ease and stability with climbing stairs to reach 2nd floor of his home.     Time  8    Period  Weeks    Status  New    Target Date  03/01/18             Plan - 01/04/18 1424    Clinical Impression Statement  Patient is a 67y/o M with PMH including Parkinson's and malignant melanoma presenting to OPPT with c/o L LBP. Has had lumbar fusion and ACDF in 2008 with resolution of symptoms  until the past 1.5-2 years. Denies radiation or N/T. Aggravating factors include standing at sink, vacuuming, prolonged walking. Reports he feels better with lumbar flexion. Reports 6-8 falls in the last 6 months, with most falls backwards and occurring d/t sleepwalking. Patient reports he is remission from melanoma in R LE. Patient today with limited lumbar AROM, decreased R LE strength, decreased flexibility, abnormal posture, and gait deviations. Unable to test balance d/t limited time today, plan to assess balance at next visit. Patient educated on stretching and ROM HEP and reported understanding. Would benefit from skilled PT services 2x/week for 8 weeks to address aforementioned impairments.     Clinical Presentation  Evolving    Clinical Decision Making  Moderate    Rehab Potential  Good    Clinical Impairments Affecting Rehab Potential  Parkinson's disease, malignant melanoma, lumbar fusion 2008, L THA, ACDF, R RTC repair, bipolar disorder, HLD, HTN    PT Frequency  2x / week    PT Duration  6 weeks    PT Treatment/Interventions  ADLs/Self Care Home Management;Cryotherapy;DME Instruction;Stair training;Gait training;Functional mobility training;Therapeutic activities;Therapeutic exercise;Manual techniques;Orthotic Fit/Training;Patient/family education;Neuromuscular re-education;Balance training;Passive range of motion;Dry needling;Energy conservation;Splinting;Taping    PT Next Visit Plan  reassess HEP, 5x STS, TUG    Consulted and Agree with Plan of Care  Patient       Patient will benefit from skilled therapeutic intervention in order to improve the following deficits and impairments:  Abnormal gait, Decreased endurance, Hypomobility, Increased edema, Decreased activity tolerance, Decreased strength, Pain, Impaired UE functional use, Difficulty walking, Decreased mobility, Decreased balance, Decreased range of motion, Improper body mechanics, Postural dysfunction, Impaired  flexibility  Visit Diagnosis: Chronic left-sided low back pain without sciatica  Muscle weakness (  generalized)  Difficulty in walking, not elsewhere classified  Unsteadiness on feet  Repeated falls     Problem List Patient Active Problem List   Diagnosis Date Noted  . Chronic midline low back pain without sciatica 12/06/2017  . Lumbar post-laminectomy syndrome 12/06/2017  . Cervical post-laminectomy syndrome 12/06/2017  . Parkinson's disease ([place]) 12/06/2017  . Malfunction of penile prosthesis ([place]) 10/09/2012  . Hyperlipemia   . Bipolar 1 disorder [facility])      Janene Harvey, PT, DPT 01/04/18 2:40 PM   [facility] [address]  [place], [place], [address] Phone: [phone]   Fax:  [phone]  Name: HARLOW CARRIZALES MRN: [id number] Date of Birth: 04/08/1950

## 2018-01-06 ENCOUNTER — Ambulatory Visit: Payer: Medicare Other | Admitting: Physical Medicine & Rehabilitation

## 2018-01-07 ENCOUNTER — Encounter: Payer: Self-pay | Admitting: Physical Therapy

## 2018-01-07 ENCOUNTER — Ambulatory Visit: Payer: Medicare Other | Admitting: Physical Therapy

## 2018-01-07 VITALS — BP 146/80 | HR 68

## 2018-01-07 DIAGNOSIS — M545 Low back pain, unspecified: Secondary | ICD-10-CM

## 2018-01-07 DIAGNOSIS — R262 Difficulty in walking, not elsewhere classified: Secondary | ICD-10-CM

## 2018-01-07 DIAGNOSIS — R2681 Unsteadiness on feet: Secondary | ICD-10-CM

## 2018-01-07 DIAGNOSIS — R296 Repeated falls: Secondary | ICD-10-CM

## 2018-01-07 DIAGNOSIS — G8929 Other chronic pain: Secondary | ICD-10-CM | POA: Diagnosis not present

## 2018-01-07 DIAGNOSIS — M6281 Muscle weakness (generalized): Secondary | ICD-10-CM | POA: Diagnosis not present

## 2018-01-07 NOTE — Therapy (Signed)
[facility] [address]  [place], [place], [address] Phone: [phone]   Fax:  [phone]  Physical Therapy Treatment  Patient Details  Name: Bill Guerrero MRN: [id number] Date of Birth: [birth year] Referring Provider (PT): Alysia Penna, MD   Encounter Date: 01/07/2018  PT End of Session - 01/07/18 1115    Visit Number  2    Number of Visits  17    Date for PT Re-Evaluation  03/01/18    Authorization Type  Medicare & Aetna    PT Start Time  6659    PT Stop Time  1108    PT Time Calculation (min)  49 min    Activity Tolerance  Patient tolerated treatment well;No increased pain    Behavior During Therapy  WFL for tasks assessed/performed       Past Medical History:  Diagnosis Date  . Arthritis    hips replacement  . Bipolar 1 disorder ([place])   . Hyperlipemia   . Hypertension   . Infection of prosthesis ([place])    penile implant with abscess with drainage of scrotum -"pinkish tan""no odor"  . Multiple body piercings   . Tremor    RT HAND    Past Surgical History:  Procedure Laterality Date  . BACK SURGERY     CERVICAL AND LUMBAR  . GROIN MASS OPEN BIOPSY     MASS REMOVED FROM GROIN  . HEMORROIDECTOMY    . HERNIA REPAIR     BIL IN HERNIA  . JOINT REPLACEMENT     TOTAL LEFT HIP  . PENILE PROSTHESIS IMPLANT N/A 06/17/2012   Procedure: THREE PIECE PENILE PROTHESIS INFLATABLE-COLOPLAST (PENILE SCROTAL APPROACH);  Surgeon: Fredricka Bonine, MD;  [facility];  Service: Urology;  Laterality: N/A;  . PENILE PROSTHESIS IMPLANT N/A 10/07/2012   Procedure: PENILE PROTHESIS REMOVAL AND REPLACEMENT INFLATABLE, PENILE SCROTAL APPROACH;  Surgeon: Fredricka Bonine, MD;  Location: [facility];  Service: Urology;  Laterality: N/A;  . PILONIDAL CYST EXCISION  1986  . ROTATOR CUFF REPAIR     RIGHT  . VASECTOMY Bilateral 06/17/2012   Procedure: VASECTOMY;  Surgeon: Fredricka Bonine, MD;   Location: [facility];  Service: Urology;  Laterality: Bilateral;    Vitals:   01/07/18 1021  BP: (!) 146/80  Pulse: 68  SpO2: 95%    Subjective Assessment - 01/07/18 1024    Subjective  Reports that his legs and back are sore from working around the house the past couple days. Had to pack his daughter up as she is moving out. Patient reports he is not on ambien anymore and his sleepwalking has gotten better.     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  Yes    Pain Score  3     Pain Location  Leg    Pain Orientation  Left;Anterior;Right    Pain Descriptors / Indicators  Sore         [facility] PT Assessment - 01/07/18 0001      Standardized Balance Assessment   Standardized Balance Assessment  Timed Up and Go Test;Five Times Sit to Stand    Five times sit to stand comments   19.4  Timed Up and Go Test   Normal TUG (seconds)  13.7    TUG Comments  14. 3 sec without SPC and chair with arm rest; 13.7 sec with SPC and chair with armrest                   [facility] Adult PT Treatment/Exercise - 01/07/18 0001      Transfers   Transfers  Supine to Sit    Supine to Sit  3: Mod assist    Supine to Sit Details (indicate cue type and reason)  manual and verbal cues required for hand and LE placement    Comments  patient with c/o L hip pain upon rolling to L side, but able to continue      Exercises   Exercises  Lumbar;Knee/Hip      Lumbar Exercises: Aerobic   Nustep  L1 x 44mn LEs only      Lumbar Exercises: Seated   Other Seated Lumbar Exercises  B scapular row with green TB in sitting 2x10   cues for scap retraction     Lumbar Exercises: Supine   Bridge  10 reps    Bridge Limitations  limited range; reported mild-mod L hip pain but tolerable     Other Supine Lumbar Exercises  LTR x20 both ways   limited range      Knee/Hip Exercises: Standing   Hip Flexion  Stengthening;Both;1 set;20 reps;Knee bent    Hip Flexion Limitations  standing on foam marching at counter top    Functional Squat  1 set;10 reps   cues to increase L wt shift and bring chest up   Functional Squat Limitations  at counter top    Other Standing Knee Exercises  cross body reach to cone with SPC x10 each LE      Knee/Hip Exercises: Seated   Sit to Sand  1 set;5 reps;without UE support   cues for proper set up              PT Short Term Goals - 01/07/18 1120      PT SHORT TERM GOAL #1   Title  Patient to be independent with initial HEP.    Time  4    Period  Weeks    Status  On-going        PT Long Term Goals - 01/07/18 1120      PT LONG TERM GOAL #1   Title  Patient to be independent with advanced HEP.    Time  8    Period  Weeks    Status  On-going      PT LONG TERM GOAL #2   Title  Patient to demonstrate >=4+/5 strength in B LEs.    Time  8    Period  Weeks    Status  On-going      PT LONG TERM GOAL #3   Title  Patient to score <14 sec on TUG testing with LRAD to decrease risk of falls.    Time  8    Period  Weeks    Status  Partially Met   scored 13.7 sec with SPC     PT LONG TERM GOAL #4   Title  Patient to score <=16 sec on 5x STS without UEs to decrease risk of falls.    Time  8    Period  Weeks    Status  On-going   scored 19.4 sec without UE use  PT LONG TERM GOAL #5   Title  Patient to join rocksteady boxing program or other community-based fitness program for maintenance of strength and endurance.     Time  8    Period  Weeks    Status  On-going      PT LONG TERM GOAL #6   Title  Patient to report 75% improvement in ease and stability with climbing stairs to reach 2nd floor of his home.     Time  8    Period  Weeks    Status  On-going            Plan - 01/07/18 1115    Clinical Impression  Statement  Patient arrived to session with report of LE soreness after 2 days of helping his daughter move. Systolic BP slightly elevated at beginning of session but [facility]. Reports that d/t this, he was not able to try his HEP. Patient scored fairly well on TUG testing today, taking 13.7 seconds with SPC indicating that he is at a decreased risk of falls. Patient however reporting unsteadiness with the turning component of the test. Worked on STS transfers with cues for set-up and subsequently tested 5xSTS, with patient scoring 19.4 sec which does place him in the increased fall risk category for the Parkinson's patient population. Good tolerance of LE strengthening and balance challenges, with patient requiring intermittent sitting rest break. Ended session with education and assistance for supine > sit transfer with patient requiring cues for hand and LE placement and mod A for the transfer. Patient did have c/o L hip pain upon rolling to L side, but able to continue and reported no lasting pain. Ended session with no complaints.     Clinical Impairments Affecting Rehab Potential  Parkinson's disease, malignant melanoma, lumbar fusion 2008, L THA, ACDF, R RTC repair, bipolar disorder, HLD, HTN    PT Treatment/Interventions  ADLs/Self Care Home Management;Cryotherapy;DME Instruction;Stair training;Gait training;Functional mobility training;Therapeutic activities;Therapeutic exercise;Manual techniques;Orthotic Fit/Training;Patient/family education;Neuromuscular re-education;Balance training;Passive range of motion;Dry needling;Energy conservation;Splinting;Taping    PT Next Visit Plan  rolling & supine to sit activities, LE strengthening, walking and turning     Consulted and Agree with Plan of Care  Patient       Patient will benefit from skilled therapeutic intervention in order to improve the following deficits and impairments:  Abnormal gait, Decreased endurance, Hypomobility, Increased edema, Decreased  activity tolerance, Decreased strength, Pain, Impaired UE functional use, Difficulty walking, Decreased mobility, Decreased balance, Decreased range of motion, Improper body mechanics, Postural dysfunction, Impaired flexibility  Visit Diagnosis: Chronic left-sided low back pain without sciatica  Muscle weakness (generalized)  Difficulty in walking, not elsewhere classified  Unsteadiness on feet  Repeated falls     Problem List Patient Active Problem List   Diagnosis Date Noted  . Chronic midline low back pain without sciatica 12/06/2017  . Lumbar post-laminectomy syndrome 12/06/2017  . Cervical post-laminectomy syndrome 12/06/2017  . Parkinson's disease ([place]) 12/06/2017  . Malfunction of penile prosthesis ([place]) 10/09/2012  . Hyperlipemia   . Bipolar 1 disorder [facility])     Janene Harvey, PT, DPT 01/07/18 11:24 AM   [facility] [address]  [place], [place], [address] Phone: [phone]   Fax:  [phone]  Name: QUY LOTTS MRN: [id number] Date of Birth: Feb 23, 1950

## 2018-01-11 ENCOUNTER — Encounter: Payer: Self-pay | Admitting: Physical Therapy

## 2018-01-11 ENCOUNTER — Ambulatory Visit: Payer: Medicare Other | Admitting: Physical Therapy

## 2018-01-11 VITALS — BP 153/70 | HR 80

## 2018-01-11 DIAGNOSIS — R2681 Unsteadiness on feet: Secondary | ICD-10-CM

## 2018-01-11 DIAGNOSIS — R296 Repeated falls: Secondary | ICD-10-CM

## 2018-01-11 DIAGNOSIS — M545 Low back pain, unspecified: Secondary | ICD-10-CM

## 2018-01-11 DIAGNOSIS — G8929 Other chronic pain: Secondary | ICD-10-CM | POA: Diagnosis not present

## 2018-01-11 DIAGNOSIS — M6281 Muscle weakness (generalized): Secondary | ICD-10-CM | POA: Diagnosis not present

## 2018-01-11 DIAGNOSIS — R262 Difficulty in walking, not elsewhere classified: Secondary | ICD-10-CM | POA: Diagnosis not present

## 2018-01-11 NOTE — Therapy (Addendum)
[facility] [address]  [place], [place], [address] Phone: [phone]   Fax:  [phone]  Physical Therapy Treatment  Patient Details  Name: Bill Guerrero MRN: [id number] Date of Birth: April 09, 1950 Referring Provider (PT): Alysia Penna, MD   Progress Note Reporting Period 01/04/18 to 01/11/18  See note below for Objective Data and Assessment of Progress/Goals.    Encounter Date: 01/11/2018  PT End of Session - 01/11/18 1510    Visit Number  3    Number of Visits  17    Date for PT Re-Evaluation  03/01/18    Authorization Type  Medicare & Aet[employer]    PT Start Time  1309    PT Stop Time  1405    PT Time Calculation (min)  56 min    Activity Tolerance  Patient tolerated treatment well;No increased pain    Behavior During Therapy  WFL for tasks assessed/performed       Past Medical History:  Diagnosis Date  . Arthritis    hips replacement  . Bipolar 1 disorder ([place])   . Hyperlipemia   . Hypertension   . Infection of prosthesis ([place])    penile implant with abscess with drainage of scrotum -"pinkish tan""no odor"  . Multiple body piercings   . Tremor    RT HAND    Past Surgical History:  Procedure Laterality Date  . BACK SURGERY     CERVICAL AND LUMBAR  . GROIN MASS OPEN BIOPSY     MASS REMOVED FROM GROIN  . HEMORROIDECTOMY    . HERNIA REPAIR     BIL IN HERNIA  . JOINT REPLACEMENT     TOTAL LEFT HIP  . PENILE PROSTHESIS IMPLANT N/A 06/17/2012   Procedure: THREE PIECE PENILE PROTHESIS INFLATABLE-COLOPLAST (PENILE SCROTAL APPROACH);  Surgeon: Fredricka Bonine, MD;  Location: [facility];  Service: Urology;  Laterality: N/A;  . PENILE PROSTHESIS IMPLANT N/A 10/07/2012   Procedure: PENILE PROTHESIS REMOVAL AND REPLACEMENT INFLATABLE, PENILE SCROTAL APPROACH;  Surgeon: Fredricka Bonine, MD;  Location: [facility];  Service: Urology;  Laterality: N/A;  . PILONIDAL CYST EXCISION  1986  .  ROTATOR CUFF REPAIR     RIGHT  . VASECTOMY Bilateral 06/17/2012   Procedure: VASECTOMY;  Surgeon: Fredricka Bonine, MD;  Location: [facility];  Service: Urology;  Laterality: Bilateral;    Vitals:   01/11/18 1310  BP: (!) 153/70  Pulse: 80  SpO2: 97%    Subjective Assessment - 01/11/18 1310    Subjective  Reports he has been in a lot of pain over the past 2 days and having LE weakness, attributes this to possibly lifting more than usual at home to get ready for christmas.     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  Yes    Pain Score  3     Pain Location  Leg    Pain Orientation  Left;Lower    Pain Descriptors / Indicators  Sharp    Pain Type  Chronic pain                       [facility] Adult PT Treatment/Exercise - 01/11/18 0001  Self-Care   Self-Care  Other Self-Care Comments    Other Self-Care Comments   self-STM to L LB education      Exercises   Exercises  Lumbar;Knee/Hip      Lumbar Exercises: Stretches   Passive Hamstring Stretch  Right;Left;1 rep;30 seconds;Limitations    Passive Hamstring Stretch Limitations  supine stretch    Hip Flexor Stretch  Right;Left;1 rep;30 seconds;Limitations    Hip Flexor Stretch Limitations  at wall to tolerane    Other Lumbar Stretch Exercise  B pec stretch in corner 2x20"      Lumbar Exercises: Aerobic   Nustep  L1 x 58mn LEs only      Lumbar Exercises: Standing   Other Standing Lumbar Exercises  mini squat + straight arm pulldown with red TB x10   cues for coordination of movement     Lumbar Exercises: Supine   Bridge  10 reps    Bridge Limitations  limited range    Other Supine Lumbar Exercises  supine weight shift with overhead UE reach 10x   good tolerance   Other Supine Lumbar  Exercises  LTR x20 both ways   increased ROM to R     Lumbar Exercises: Sidelying   Other Sidelying Lumbar Exercises  open book stretch with top knee bent 10x each side to tol      Lumbar Exercises: Prone   Other Prone Lumbar Exercises  prone press up 10x   very limited motion but good tolerance     Manual Therapy   Manual Therapy  Soft tissue mobilization    Soft tissue mobilization  STM to L paraspinals and QL- TTP here             PT Education - 01/11/18 1513    Education Details  edu on importance of compliance with HEP for max benefit; advised patient to contact MD about specific bandage to be used on R LE before using Ace wrap    Person(s) Educated  Patient    Methods  Explanation    Comprehension  Verbalized understanding       PT Short Term Goals - 01/07/18 1120      PT SHORT TERM GOAL #1   Title  Patient to be independent with initial HEP.    Time  4    Period  Weeks    Status  On-going        PT Long Term Goals - 01/07/18 1120      PT LONG TERM GOAL #1   Title  Patient to be independent with advanced HEP.    Time  8    Period  Weeks    Status  On-going      PT LONG TERM GOAL #2   Title  Patient to demonstrate >=4+/5 strength in B LEs.    Time  8    Period  Weeks    Status  On-going      PT LONG TERM GOAL #3   Title  Patient to score <14 sec on TUG testing with LRAD to decrease risk of falls.    Time  8    Period  Weeks    Status  Partially Met   scored 13.7 sec with SPC     PT LONG TERM GOAL #4   Title  Patient to score <=16 sec on 5x STS without UEs to decrease risk of falls.    Time  8    Period  Weeks    Status  On-going   scored 19.4 sec without UE use     PT LONG TERM GOAL #5   Title  Patient to join rocksteady boxing program or other community-based fitness program for maintenance of strength and endurance.     Time  8    Period  Weeks    Status  On-going      PT LONG TERM GOAL #6   Title  Patient to report 75% improvement in  ease and stability with climbing stairs to reach 2nd floor of his home.     Time  8    Period  Weeks    Status  On-going            Plan - 01/11/18 1511    Clinical Impression Statement  Patient arrived to session with report of increased LBP over the past 2 days- attributes this to having to lift more over the weekend to get ready for Christmas. Also reports that he is fearful of taking showers d/t fear of falling despite having shower seat. Patient tolerated STM to L lumbar paraspinals and QL with tenderness reported in this area and report of relief after manual therapy. Worked on lumbopelvic ROM and core strengthening exercises with focus on rotation. Patient tolerated this well. Able to tolerate prone press ups, however with some difficulty d/t limited ROM. Patient did better today with bed mobility required to move from one ther-ex activity to another. Did c/o dizziness after standing pec stretch which dissipated after sitting rest break. Took vitals- systolic BP elevated however WFL. Patient denied any other symptoms and was asymptomatic upon completion of session. Patient voiced concern over chronic R LE swelling today, however reported that MD aware of this.     Clinical Impairments Affecting Rehab Potential  Parkinson's disease, malignant melanoma, lumbar fusion 2008, L THA, ACDF, R RTC repair, bipolar disorder, HLD, HTN    PT Treatment/Interventions  ADLs/Self Care Home Management;Cryotherapy;DME Instruction;Stair training;Gait training;Functional mobility training;Therapeutic activities;Therapeutic exercise;Manual techniques;Orthotic Fit/Training;Patient/family education;Neuromuscular re-education;Balance training;Passive range of motion;Dry needling;Energy conservation;Splinting;Taping    PT Next Visit Plan  rolling & supine to sit activities, LE strengthening, walking and turning     Consulted and Agree with Plan of Care  Patient       Patient will benefit from skilled therapeutic  intervention in order to improve the following deficits and impairments:  Abnormal gait, Decreased endurance, Hypomobility, Increased edema, Decreased activity tolerance, Decreased strength, Pain, Impaired UE functional use, Difficulty walking, Decreased mobility, Decreased balance, Decreased range of motion, Improper body mechanics, Postural dysfunction, Impaired flexibility  Visit Diagnosis: Chronic left-sided low back pain without sciatica  Muscle weakness (generalized)  Difficulty in walking, not elsewhere classified  Unsteadiness on feet  Repeated falls     Problem List Patient Active Problem List   Diagnosis Date Noted  . Chronic midline low back pain without sciatica 12/06/2017  . Lumbar post-laminectomy syndrome 12/06/2017  . Cervical post-laminectomy syndrome 12/06/2017  . Parkinson's disease ([place]) 12/06/2017  . Malfunction of penile prosthesis ([place]) 10/09/2012  . Hyperlipemia   . Bipolar 1 disorder [facility])     Janene Harvey, PT, DPT 01/11/18 3:15 PM   [facility] [address]  [place], [place], [address] Phone: [phone]   Fax:  [phone]  Name: Bill Guerrero MRN: [id number] Date of Birth: 03-31-1950  PHYSICAL THERAPY DISCHARGE SUMMARY  Visits from Start of Care: 3  Current functional level related to goals / functional outcomes: Unable to  assess; patient did not return since last session   Remaining deficits: Unable to assess   Education / Equipment: HEP  Plan: Patient agrees to discharge.  Patient goals were not met. Patient is being discharged due to not returning since the last visit.  ?????     Janene Harvey, PT, DPT 02/14/18 9:51 AM

## 2018-01-14 ENCOUNTER — Encounter: Payer: Medicare Other | Admitting: Physical Therapy

## 2018-01-17 ENCOUNTER — Ambulatory Visit (INDEPENDENT_AMBULATORY_CARE_PROVIDER_SITE_OTHER): Payer: Medicare Other | Admitting: Psychiatry

## 2018-01-17 ENCOUNTER — Encounter: Payer: Self-pay | Admitting: Psychiatry

## 2018-01-17 DIAGNOSIS — F40240 Claustrophobia: Secondary | ICD-10-CM

## 2018-01-17 DIAGNOSIS — F3130 Bipolar disorder, current episode depressed, mild or moderate severity, unspecified: Secondary | ICD-10-CM

## 2018-01-17 DIAGNOSIS — F411 Generalized anxiety disorder: Secondary | ICD-10-CM | POA: Diagnosis not present

## 2018-01-17 NOTE — Progress Notes (Signed)
Bill Guerrero [id number] 02-04-1950 67 y.o.  Subjective:   Patient ID:  Bill Guerrero is a 66 y.o. (DOB [birth year]) male.  Chief Complaint:  Chief Complaint  Patient presents with  . Follow-up    Bipolar    HPI Bill Guerrero presents to the office today for follow-up of insomnia and the change to [facility].  Never got Lunesta bc of the cost.  No longer has amnesia at night.  Done OK without Ambien.  Can manage sleep since not working.  May still get up at night. Pt reports that mood is Anxious and describes anxiety as Moderate. Anxiety symptoms include: Excessive Worry,. Pt reports has interrupted sleep. Pt reports that appetite is good. Pt reports that energy is lethargic and no change. Concentration is down slightly. Suicidal thoughts:  denied by patient.  Less dep and anxiety than last visit.  Doing PT may be helping mood.   Review of Systems:  Review of Systems  Constitutional: Positive for fatigue.  Musculoskeletal: Positive for back pain and gait problem.  Neurological: Positive for weakness. Negative for tremors.       Balance problems from PD.  Psychiatric/Behavioral: Positive for sleep disturbance. Negative for agitation, behavioral problems, confusion, decreased concentration, dysphoric mood, hallucinations, self-injury and suicidal ideas. The patient is nervous/anxious. The patient is not hyperactive.   Frequent falls are a problem.  Can't pick himself up without help.  Medications: I have reviewed the patient's current medications.  Current Outpatient Medications  Medication Sig Dispense Refill  . Ascorbic Acid (VITAMIN C) 1000 MG tablet Take 1,000 mg by mouth 2 (two) times daily.     . B Complex-C (B-COMPLEX WITH VITAMIN C) tablet Take 1 tablet by mouth daily.    . carbidopa-levodopa (SINEMET IR) 25-100 MG tablet Take 1 tablet by mouth 4 (four) times daily. 360 tablet 1  . Cholecalciferol (VITAMIN D-3) 5000 UNITS TABS Take 1 tablet by mouth daily.    .  Coenzyme Q10 200 MG capsule Take 200 mg by mouth 2 (two) times daily.    Marland Kitchen DHEA 50 MG TABS Take 1 tablet by mouth 2 (two) times daily.    . fenofibrate 160 MG tablet Take 160 mg by mouth daily before breakfast.    . fish oil-omega-3 fatty acids 1000 MG capsule Take 6,000 mg by mouth 4 (four) times daily - after meals and at bedtime. 2000 mg TID    . lamoTRIgine (LAMICTAL) 200 MG tablet Take 200 mg by mouth at bedtime.    Marland Kitchen LORazepam (ATIVAN) 0.5 MG tablet Take 0.5 mg by mouth every 4 (four) hours as needed for anxiety.     Marland Kitchen losartan (COZAAR) 50 MG tablet Take 50 mg by mouth daily. Pt. Is unsure of Mg.    . magnesium oxide (MAG-OX) 400 MG tablet Take 400 mg by mouth daily.    . methocarbamol (ROBAXIN) 500 MG tablet Take 1 tablet (500 mg total) by mouth every 8 (eight) hours as needed for muscle spasms. 45 tablet 1  . niacin 500 MG tablet Take 500 mg by mouth daily with breakfast.    . OXcarbazepine (TRILEPTAL) 150 MG tablet Take 150 mg by mouth 2 (two) times daily.     . Potassium 99 MG TABS Take 1 tablet by mouth daily.     . pramipexole (MIRAPEX) 1 MG tablet TAKE 1 TABLET(1 MG) BY MOUTH THREE TIMES DAILY 270 tablet 1  . pravastatin (PRAVACHOL) 80 MG tablet Take 80 mg by mouth daily before  breakfast.     . QUEtiapine (SEROQUEL XR) 300 MG 24 hr tablet Take 300 mg by mouth at bedtime.     . carbidopa-levodopa (SINEMET CR) 50-200 MG tablet Take 1 tablet by mouth at bedtime. (Patient not taking: Reported on 01/17/2018) 30 tablet 5  . valsartan (DIOVAN) 160 MG tablet Take 160 mg by mouth daily.  5   No current facility-administered medications for this visit.     Medication Side Effects: None  Allergies: No Known Allergies  Past Medical History:  Diagnosis Date  . Arthritis    hips replacement  . Bipolar 1 disorder ([place])   . Hyperlipemia   . Hypertension   . Infection of prosthesis ([place])    penile implant with abscess with drainage of scrotum -"pinkish tan""no odor"  . Multiple body  piercings   . Tremor    RT HAND    History reviewed. No pertinent family history.  Social History   Socioeconomic History  . Marital status: Married    Spouse name: Not on file  . Number of children: Not on file  . Years of education: Not on file  . Highest education level: Not on file  Occupational History  . Not on file  Social Needs  . Financial resource strain: Not on file  . Food insecurity:    Worry: Not on file    Inability: Not on file  . Transportation needs:    Medical: Not on file    Non-medical: Not on file  Tobacco Use  . Smoking status: Never Smoker  . Smokeless tobacco: Never Used  Substance and Sexual Activity  . Alcohol use: No    Alcohol/week: 1.0 standard drinks    Types: 1 Glasses of wine per week    Comment: no alcohol in 2 years  . Drug use: No  . Sexual activity: Yes  Lifestyle  . Physical activity:    Days per week: Not on file    Minutes per session: Not on file  . Stress: Not on file  Relationships  . Social connections:    Talks on phone: Not on file    Gets together: Not on file    Attends religious service: Not on file    Active member of club or organization: Not on file    Attends meetings of clubs or organizations: Not on file    Relationship status: Not on file  . Intimate partner violence:    Fear of current or ex partner: Not on file    Emotionally abused: Not on file    Physically abused: Not on file    Forced sexual activity: Not on file  Other Topics Concern  . Not on file  Social History Narrative  . Not on file    Past Medical History, Surgical history, Social history, and Family history were reviewed and updated as appropriate.   Please see review of systems for further details on the patient's review from today.   Objective:   Physical Exam:  There were no vitals taken for this visit.  Physical Exam Constitutional:      General: He is not in acute distress.    Appearance: Normal appearance. He is  well-developed.  Musculoskeletal:        General: No deformity.  Neurological:     Mental Status: He is alert and oriented to person, place, and time.     Motor: Tremor present.     Coordination: Coordination abnormal.     Gait: Gait  abnormal.     Comments: Uses cane for balance  Psychiatric:        Attention and Perception: Attention and perception normal.        Mood and Affect: Mood is anxious. Mood is not depressed. Affect is not labile, blunt, angry or inappropriate.        Speech: Speech normal.        Behavior: Behavior is slowed.        Thought Content: Thought content normal. Thought content is not paranoid. Thought content does not include homicidal or suicidal ideation. Thought content does not include homicidal or suicidal plan.        Cognition and Memory: Cognition normal.        Judgment: Judgment normal.     Comments: Insight intact. No auditory or visual hallucinations. No delusions.  Less preoccupation with health.     Lab Review:     Component Value Date/Time   NA 141 03/30/2016 1219   K 3.7 03/30/2016 1219   CL 107 03/30/2016 1219   CO2 25 03/30/2016 1219   GLUCOSE 97 03/30/2016 1219   BUN 17 03/30/2016 1219   CREATININE 0.80 03/30/2016 1219   CALCIUM 9.4 03/30/2016 1219   PROT 6.9 03/30/2016 1219   ALBUMIN 4.7 03/30/2016 1219   AST 21 03/30/2016 1219   ALT 27 03/30/2016 1219   ALKPHOS 65 03/30/2016 1219   BILITOT 0.6 03/30/2016 1219   GFRNONAA 86 (L) 10/06/2012 1130   GFRAA >90 10/06/2012 1130       Component Value Date/Time   WBC 5.6 11/26/2014 1133   RBC 4.04 (L) 11/26/2014 1133   HGB 12.8 (L) 11/26/2014 1133   HCT 38.8 (L) 11/26/2014 1133   PLT 195.0 11/26/2014 1133   MCV 96.2 11/26/2014 1133   MCH 31.9 10/06/2012 1130   MCHC 33.0 11/26/2014 1133   RDW 13.6 11/26/2014 1133   LYMPHSABS 1.1 11/26/2014 1133   MONOABS 0.6 11/26/2014 1133   EOSABS 0.1 11/26/2014 1133   BASOSABS 0.0 11/26/2014 1133    Lithium Lvl  Date Value Ref Range  Status  08/17/2010 0.44 (L) 0.80 - 1.40 mEq/L Final     No results found for: PHENYTOIN, PHENOBARB, VALPROATE, CBMZ   .res Assessment: Plan:    Bipolar I disorder, most recent episode depressed ([place])  Generalized anxiety disorder  Claustrophobia   Disc balancing benefit vs SE of Seroquel in view of Parkinson's Dz.  Currently seems to be doing ok.  Supportive therapy and health direction given severe dx PD and malignant melanoma.  Enc cont exercise which is helping.  Disc costs of Rx and how to manage for ex with GoodRx  No med changes this visit.  FU 3 mos  Lynder Parents, MD, DFAPA     Please see After Visit Summary for patient specific instructions.  Future Appointments  Date Time Provider [place]  02/14/2018  2:15 PM [facility] None  02/14/2018  2:30 PM Kirsteins, Luanna Salk, MD [id number] None  03/07/2018 11:15 AM [name], [name], DO [facility] None    No orders of the defined types were placed in this encounter.     -------------------------------

## 2018-01-18 ENCOUNTER — Other Ambulatory Visit: Payer: Self-pay

## 2018-01-18 MED ORDER — LAMOTRIGINE 200 MG PO TABS: 200.0000 mg | ORAL_TABLET | Freq: Every day | ORAL | 1 refills | Status: DC

## 2018-02-14 ENCOUNTER — Ambulatory Visit: Payer: Medicare Other | Admitting: Physical Medicine & Rehabilitation

## 2018-02-15 DIAGNOSIS — R600 Localized edema: Secondary | ICD-10-CM | POA: Diagnosis not present

## 2018-02-15 DIAGNOSIS — G20 Parkinson's disease: Secondary | ICD-10-CM | POA: Diagnosis not present

## 2018-02-15 DIAGNOSIS — Z79899 Other long term (current) drug therapy: Secondary | ICD-10-CM | POA: Diagnosis not present

## 2018-02-15 DIAGNOSIS — C4371 Malignant melanoma of right lower limb, including hip: Secondary | ICD-10-CM | POA: Diagnosis not present

## 2018-02-28 ENCOUNTER — Ambulatory Visit: Payer: Medicare Other | Admitting: Physical Medicine & Rehabilitation

## 2018-03-03 DIAGNOSIS — C4371 Malignant melanoma of right lower limb, including hip: Secondary | ICD-10-CM | POA: Diagnosis not present
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
## 2018-03-07 ENCOUNTER — Encounter: Payer: Self-pay | Admitting: Neurology

## 2018-03-07 ENCOUNTER — Ambulatory Visit (INDEPENDENT_AMBULATORY_CARE_PROVIDER_SITE_OTHER): Payer: Medicare Other | Admitting: Neurology

## 2018-03-07 ENCOUNTER — Other Ambulatory Visit: Payer: Self-pay | Admitting: *Deleted

## 2018-03-07 VITALS — BP 110/76 | HR 74 | Ht 70.25 in | Wt 293.2 lb

## 2018-03-07 DIAGNOSIS — G4730 Sleep apnea, unspecified: Secondary | ICD-10-CM

## 2018-03-07 DIAGNOSIS — G20 Parkinson's disease: Secondary | ICD-10-CM

## 2018-03-07 DIAGNOSIS — F331 Major depressive disorder, recurrent, moderate: Secondary | ICD-10-CM

## 2018-03-07 DIAGNOSIS — R635 Abnormal weight gain: Secondary | ICD-10-CM | POA: Diagnosis not present

## 2018-03-07 MED ORDER — CARBIDOPA-LEVODOPA 25-100 MG PO TABS: ORAL_TABLET | ORAL | 5 refills | Status: DC

## 2018-03-07 MED ORDER — PRAMIPEXOLE DIHYDROCHLORIDE 0.5 MG PO TABS: 0.5000 mg | ORAL_TABLET | Freq: Three times a day (TID) | ORAL | 5 refills | Status: DC

## 2018-03-07 NOTE — Patient Instructions (Signed)
1.  Take pramipexole, 1 mg, 1 tablet in the AM, 1 in the afternoon and 0.5 tablet in the evening for a week.  Then take 1 tablet in the AM, 0.5 tablet in the afternoon and 0.5 tablet in the evening for a week.  Then stop the pramipexole 1 mg and start the pramipexole 0.5 mg, 1 tablet three times per day  2.  Increase carbidopa/levodopa 25/100, 1.5 tablets four times per day

## 2018-04-18 ENCOUNTER — Ambulatory Visit: Payer: Medicare Other | Admitting: Psychiatry

## 2018-04-21 ENCOUNTER — Other Ambulatory Visit: Payer: Self-pay | Admitting: Psychiatry

## 2018-04-25 ENCOUNTER — Other Ambulatory Visit: Payer: Self-pay | Admitting: Psychiatry

## 2018-05-30 DIAGNOSIS — G250 Essential tremor: Secondary | ICD-10-CM | POA: Diagnosis not present

## 2018-05-30 DIAGNOSIS — F319 Bipolar disorder, unspecified: Secondary | ICD-10-CM | POA: Diagnosis not present

## 2018-05-30 DIAGNOSIS — E538 Deficiency of other specified B group vitamins: Secondary | ICD-10-CM | POA: Diagnosis not present

## 2018-05-30 DIAGNOSIS — M459 Ankylosing spondylitis of unspecified sites in spine: Secondary | ICD-10-CM | POA: Diagnosis not present

## 2018-05-30 DIAGNOSIS — E559 Vitamin D deficiency, unspecified: Secondary | ICD-10-CM | POA: Diagnosis not present

## 2018-05-30 DIAGNOSIS — G20 Parkinson's disease: Secondary | ICD-10-CM | POA: Diagnosis not present

## 2018-05-30 DIAGNOSIS — E785 Hyperlipidemia, unspecified: Secondary | ICD-10-CM | POA: Diagnosis not present

## 2018-05-30 DIAGNOSIS — R600 Localized edema: Secondary | ICD-10-CM | POA: Diagnosis not present

## 2018-05-30 DIAGNOSIS — I10 Essential (primary) hypertension: Secondary | ICD-10-CM | POA: Diagnosis not present

## 2018-05-30 DIAGNOSIS — M1990 Unspecified osteoarthritis, unspecified site: Secondary | ICD-10-CM | POA: Diagnosis not present

## 2018-05-30 DIAGNOSIS — Z0000 Encounter for general adult medical examination without abnormal findings: Secondary | ICD-10-CM | POA: Diagnosis not present

## 2018-06-09 ENCOUNTER — Telehealth: Payer: Self-pay | Admitting: Psychiatry

## 2018-06-09 ENCOUNTER — Other Ambulatory Visit: Payer: Self-pay

## 2018-06-09 ENCOUNTER — Other Ambulatory Visit: Payer: Self-pay | Admitting: Psychiatry

## 2018-06-09 MED ORDER — LORAZEPAM 0.5 MG PO TABS: 0.5000 mg | ORAL_TABLET | ORAL | 3 refills | Status: DC | PRN

## 2018-06-09 NOTE — Telephone Encounter (Signed)
Pended for approval for [facility]

## 2018-06-09 NOTE — Telephone Encounter (Signed)
Patient's wife called and said that they need a new script to be sent to [facility] on [address]. That is their new pharmacy. He only has one pill left getting ready to have cancer treatments an donly usues it occasionally

## 2018-06-10 DIAGNOSIS — C438 Malignant melanoma of overlapping sites of skin: Secondary | ICD-10-CM | POA: Diagnosis not present

## 2018-06-16 DIAGNOSIS — C4371 Malignant melanoma of right lower limb, including hip: Secondary | ICD-10-CM | POA: Diagnosis not present

## 2018-06-28 ENCOUNTER — Encounter: Payer: Self-pay | Admitting: Neurology

## 2018-07-15 ENCOUNTER — Ambulatory Visit: Payer: Medicare Other | Admitting: Neurology

## 2018-07-18 ENCOUNTER — Other Ambulatory Visit: Payer: Self-pay | Admitting: Psychiatry

## 2018-07-19 NOTE — Telephone Encounter (Signed)
Needs appt

## 2018-07-21 ENCOUNTER — Other Ambulatory Visit: Payer: Self-pay | Admitting: Psychiatry

## 2018-08-03 DIAGNOSIS — D229 Melanocytic nevi, unspecified: Secondary | ICD-10-CM | POA: Diagnosis not present

## 2018-08-03 DIAGNOSIS — B353 Tinea pedis: Secondary | ICD-10-CM | POA: Diagnosis not present

## 2018-08-03 DIAGNOSIS — Z85820 Personal history of malignant melanoma of skin: Secondary | ICD-10-CM | POA: Diagnosis not present

## 2018-08-17 ENCOUNTER — Other Ambulatory Visit: Payer: Self-pay | Admitting: Neurology

## 2018-08-17 NOTE — Telephone Encounter (Signed)
Requested Prescriptions   Pending Prescriptions Disp Refills  . pramipexole (MIRAPEX) 0.5 MG tablet [Pharmacy Med Name: PRAMIPEXOLE 0.5 MG TABLET] 66 tablet 3    Sig: TAKE ONE TABLET BY MOUTH THREE TIMES A DAY  . carbidopa-levodopa (SINEMET IR) 25-100 MG tablet [Pharmacy Med Name: CARBIDOPA-LEVODOPA 25-100 TAB] 132 tablet 3    Sig: TAKE 1 AND A 1/2  TABLETS BY MOUTH FOUR TIMES A DAY   Rx last filled: 03/07/18 #90 5 refills; 03/07/18 #180 5 refills   Pt last seen: 03/07/18  Assessment/Plan:   1.  Parkinsonism             - refuses DaT.  Unable to get off of seroquel             -discussed the pramipexole.  Talked about whether contributing to compulsive eating or EDS.  Decided to decrease pramipexole to 0.5 mg tid (from 1 mg tid)             -increase carbidopa/levodopa 25/100, 1.5 tablets qid.               -Talked about safety.  Talked about the value of exercise. 2.  Low back pain   Follow up appt scheduled: 08/23/18

## 2018-08-19 NOTE — Progress Notes (Signed)
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 increases risk for melanoma. 10.  Significant weight gain over the years  -Suspect primarily due to depression.  Patient reports that he is going to be starting a low-carb diet. 11.  Follow-up in the next 6 months, sooner should new neurologic issues arise.

## 2018-08-23 ENCOUNTER — Ambulatory Visit (INDEPENDENT_AMBULATORY_CARE_PROVIDER_SITE_OTHER): Payer: Medicare Other | Admitting: Neurology

## 2018-08-23 ENCOUNTER — Other Ambulatory Visit: Payer: Self-pay

## 2018-08-23 ENCOUNTER — Encounter: Payer: Self-pay | Admitting: Neurology

## 2018-08-23 VITALS — BP 150/73 | HR 86 | Temp 98.3°F | Ht 72.0 in | Wt 312.6 lb

## 2018-08-23 DIAGNOSIS — R635 Abnormal weight gain: Secondary | ICD-10-CM

## 2018-08-23 DIAGNOSIS — F331 Major depressive disorder, recurrent, moderate: Secondary | ICD-10-CM | POA: Diagnosis not present

## 2018-08-23 DIAGNOSIS — G8929 Other chronic pain: Secondary | ICD-10-CM

## 2018-08-23 DIAGNOSIS — Z1159 Encounter for screening for other viral diseases: Secondary | ICD-10-CM | POA: Diagnosis not present

## 2018-08-23 DIAGNOSIS — G20 Parkinson's disease: Secondary | ICD-10-CM | POA: Diagnosis not present

## 2018-08-23 DIAGNOSIS — M545 Low back pain: Secondary | ICD-10-CM | POA: Diagnosis not present

## 2018-08-23 MED ORDER — AMANTADINE HCL 100 MG PO CAPS: 100.0000 mg | ORAL_CAPSULE | Freq: Two times a day (BID) | ORAL | 1 refills | Status: DC

## 2018-08-23 NOTE — Patient Instructions (Signed)
1.  Start amantadine - 100 mg - 1 tablet with the first and third dosage of your carbidopa/levodopa 2.  We will send a referral to physical therapy.  Please call me if you don't intend to go to that 3.  I would encourage you to go to counseling or do it online  The physicians and staff at [facility] are committed to providing excellent care. You may receive a survey requesting feedback about your experience at our office. We strive to receive "very good" responses to the survey questions. If you feel that your experience would prevent you from giving the office a "very good " response, please contact our office to try to remedy the situation. We may be reached at [phone]. Thank you for taking the time out of your busy day to complete the survey.

## 2018-09-06 ENCOUNTER — Other Ambulatory Visit: Payer: Self-pay

## 2018-09-06 ENCOUNTER — Ambulatory Visit: Payer: Medicare Other | Attending: Neurology | Admitting: Physical Therapy

## 2018-09-06 VITALS — BP 114/54 | HR 76

## 2018-09-06 DIAGNOSIS — R262 Difficulty in walking, not elsewhere classified: Secondary | ICD-10-CM | POA: Diagnosis not present

## 2018-09-06 DIAGNOSIS — M6281 Muscle weakness (generalized): Secondary | ICD-10-CM | POA: Insufficient documentation

## 2018-09-06 DIAGNOSIS — M545 Low back pain, unspecified: Secondary | ICD-10-CM

## 2018-09-06 DIAGNOSIS — R296 Repeated falls: Secondary | ICD-10-CM | POA: Diagnosis not present

## 2018-09-06 DIAGNOSIS — G8929 Other chronic pain: Secondary | ICD-10-CM | POA: Insufficient documentation

## 2018-09-06 DIAGNOSIS — R2681 Unsteadiness on feet: Secondary | ICD-10-CM | POA: Insufficient documentation

## 2018-09-06 NOTE — Therapy (Signed)
[facility] [address]  [place], [place], [address] Phone: [phone]   Fax:  [phone]  Physical Therapy Evaluation  Patient Details  Name: Bill Guerrero MRN: [id number] Date of Birth: 09-Mar-1950 Referring Provider (PT): Alonza Bogus, MD   Encounter Date: 09/06/2018  PT End of Session - 09/06/18 1315    Visit Number  1    Number of Visits  16    Date for PT Re-Evaluation  11/01/18    Authorization Type  Medicare & AARP    PT Start Time  3785    PT Stop Time  1421    PT Time Calculation (min)  66 min    Activity Tolerance  Patient tolerated treatment well;Patient limited by pain;Treatment limited secondary to medical complications (Comment)   dizziness upon sitting after walking short distances   Behavior During Therapy  [facility] for tasks assessed/performed       Past Medical History:  Diagnosis Date  . Arthritis    hips replacement  . Bipolar 1 disorder ([place])   . Hyperlipemia   . Hypertension   . Infection of prosthesis ([place])    penile implant with abscess with drainage of scrotum -"pinkish tan""no odor"  . Multiple body piercings   . Tremor    RT HAND    Past Surgical History:  Procedure Laterality Date  . BACK SURGERY     CERVICAL AND LUMBAR  . GROIN MASS OPEN BIOPSY     MASS REMOVED FROM GROIN  . HEMORROIDECTOMY    . HERNIA REPAIR     BIL IN HERNIA  . JOINT REPLACEMENT     TOTAL LEFT HIP  . PENILE PROSTHESIS IMPLANT N/A 06/17/2012   Procedure: THREE PIECE PENILE PROTHESIS INFLATABLE-COLOPLAST (PENILE SCROTAL APPROACH);  Surgeon: Fredricka Bonine, MD;  Location: [facility];  Service: Urology;  Laterality: N/A;  . PENILE PROSTHESIS IMPLANT N/A 10/07/2012   Procedure: PENILE PROTHESIS REMOVAL AND REPLACEMENT INFLATABLE, PENILE SCROTAL APPROACH;  Surgeon: Fredricka Bonine, MD;  Location: [facility];  Service: Urology;  Laterality: N/A;  . PILONIDAL CYST EXCISION  1986  . ROTATOR CUFF  REPAIR     RIGHT  . VASECTOMY Bilateral 06/17/2012   Procedure: VASECTOMY;  Surgeon: Fredricka Bonine, MD;  Location: [facility];  Service: Urology;  Laterality: Bilateral;    Vitals:   09/06/18 1319  BP: (!) 114/54  Pulse: 76  SpO2: 96%     Subjective Assessment - 09/06/18 1319    Subjective  Pt reporting first diagnosed with Parkinson's ~5 yrs ago but no formal PT for Parkinson's. Did have 3 visits for low back pain in late 2019 - pt states back pain resulting from stooped posture due to Parkinson's. Uses self-fabricated 4-wheel RW (standard frame, not rollator) first thing in the morning and then cane later in the day. Last fall in December 2019 but had been falling on a regular basis prior to that. Reports B LE weakness. Spends most of day in recliner.    Pertinent History  Parkinson's x 5 yrs    Limitations  Standing;Walking    How long can you stand comfortably?  ~5  minutes    How long can you walk comfortably?  <5 minutes    Patient Stated Goals  "be able to join [employer] boxing class (long term); be able to take care of more things around the house"    Currently in Pain?  Yes    Pain Score  8    when walking, typically subsides with rest/sitting   Pain Location  Back    Pain Orientation  Lower   L>R   Pain Descriptors / Indicators  Burning    Pain Type  Chronic pain    Pain Radiating Towards  into B hips    Pain Onset  More than a month ago    Pain Frequency  Intermittent    Aggravating Factors   walking, standing    Pain Relieving Factors  Aleve and heating pad, but minimal relief    Effect of Pain on Daily Activities  limited walking tolerance; unable to exercise; unable to take care of things around home         [facility] PT Assessment - 09/06/18 1315      Assessment   Medical Diagnosis  Parkinson's disease    Referring Provider (PT)  Alonza Bogus, MD    Onset Date/Surgical Date  --   ~5 yrs ago   Next MD Visit  02/28/2019    Prior Therapy  3 visits for LBP  in Dec 2019      Precautions   Precautions  Fall      Balance Screen   Has the patient fallen in the past 6 months  No    Has the patient had a decrease in activity level because of a fear of falling?   No    Is the patient reluctant to leave their home because of a fear of falling?   No      Home Environment   Living Environment  Private residence    Living Arrangements  Spouse/significant other    Available Help at Discharge  Family    Type of [place] to enter    Entrance Stairs-Number of Steps  3    Entrance Stairs-Rails  Right;Left;Cannot reach both    [place]  Two level    Alternate Level Stairs-Number of Steps  15   very steep   Alternate Level Stairs-Rails  Right    [place] - 4 wheels;Walker - 2 wheels;Cane - single point;Shower seat;Grab bars - tub/shower;Tub bench      Prior Function   Level of Independence  Independent;Independent with basic ADLs;Needs assistance with homemaking;Independent with household mobility with device;Independent with community mobility with device    Vocation  Retired    Leisure  mostly sedentary; would like to be able to 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   Overall Cognitive Status  Within Functional Limits for tasks assessed   feels like he gets confused easier   Memory  Impaired      Posture/Postural Control   Posture/Postural Control  Postural limitations    Postural Limitations  Forward head;Rounded Shoulders;Decreased thoracic kyphosis;Decreased lumbar lordosis;Posterior pelvic tilt;Flexed trunk      ROM / Strength   AROM / PROM / Strength  Strength      Strength   Overall Strength Comments  tested in sitting    Strength Assessment Site  Hip;Knee;Ankle    Right/Left Hip  Right;Left    Right Hip Flexion  4/5    Right Hip Extension  4-/5    Right Hip External Rotation   4/5    Right Hip Internal Rotation  4/5    Right Hip ABduction  4/5    Right Hip ADduction  4/5    Left  Hip Flexion  4-/5  Left Hip Extension  3+/5    Left Hip External Rotation  3+/5    Left Hip Internal Rotation  4-/5    Left Hip ABduction  4/5    Left Hip ADduction  4/5    Right/Left Knee  Right;Left    Right Knee Flexion  4-/5    Right Knee Extension  4/5    Left Knee Flexion  4-/5    Left Knee Extension  4/5    Right/Left Ankle  Right;Left    Right Ankle Dorsiflexion  4/5    Right Ankle Plantar Flexion  4-/5    Left Ankle Dorsiflexion  4/5    Left Ankle Plantar Flexion  3+/5      Ambulation/Gait   Ambulation/Gait  Yes    Ambulation/Gait Assistance  5: Supervision    Ambulation Distance (Feet)  60 Feet    Assistive device  Straight cane    Gait Pattern  Step-through pattern;Trunk flexed;Shuffle;Decreased hip/knee flexion - right;Decreased hip/knee flexion - left;Poor foot clearance - left;Poor foot clearance - right    Ambulation Surface  Level;Unlevel    Gait velocity  1.76 ft/sec with [facility]      Standardized Balance Assessment   Standardized Balance Assessment  Berg Balance Test;Timed Up and Go Test;10 meter walk test    Five times sit to stand comments   --    10 Meter Walk  18.66 sec with SPC      Berg Balance Test   Sit to Stand  Able to stand  independently using hands    Standing Unsupported  Able to stand 2 minutes with supervision    Sitting with Back Unsupported but Feet Supported on Floor or Stool  Able to sit safely and securely 2 minutes    Stand to Sit  Uses backs of legs against chair to control descent    Transfers  Able to transfer safely, definite need of hands    Standing Unsupported with Eyes Closed  Able to stand 10 seconds with supervision    Standing Unsupported with Feet Together  Able to place feet together independently and stand for 1 minute with supervision    From Standing, Reach Forward with Outstretched Arm  Can reach forward >12 cm safely (5")    From Standing Position, Pick up Object from Floor  Unable to pick up shoe, but reaches 2-5 cm  (1-2") from shoe and balances independently    From Standing Position, Turn to Look Behind Over each Shoulder  Turn sideways only but maintains balance    Turn 360 Degrees  Needs close supervision or verbal cueing    Standing Unsupported, Alternately Place Feet on Step/Stool  Able to complete 4 steps without aid or supervision    Standing Unsupported, One Foot in Front  Able to take small step independently and hold 30 seconds    Standing on One Leg  Tries to lift leg/unable to hold 3 seconds but remains standing independently    Total Score  34    Berg comment:  < 36 high risk for falls (close to 100%)      Timed Up and Go Test   Normal TUG (seconds)  21.6   with SPC   TUG Comments  Normal: >13.5 sec indicates high fall risk                Objective measurements completed on examination: See above findings.  PT Short Term Goals - 09/06/18 1421      PT SHORT TERM GOAL #1   Title  Independent with initial HEP to address core/ LE flexibility and strengthening    Status  New    Target Date  09/27/18        PT Long Term Goals - 09/06/18 1421      PT LONG TERM GOAL #1   Title  Independent with ongoing HEP incorporating PWR! Moves as indicated    Status  New    Target Date  11/01/18      PT LONG TERM GOAL #2   Title  B LE strength >/= 4/5 to 4+/5 for improved stability    Status  New    Target Date  11/01/18      PT LONG TERM GOAL #3   Title  Patient to score <14 sec on TUG testing with LRAD to decrease risk of falls.    Status  New    Target Date  11/01/18      PT LONG TERM GOAL #4   Title  Patient to score </=16 sec on 5x STS without UEs to decrease risk of falls.    Status  New    Target Date  11/01/18      PT LONG TERM GOAL #5   Title  Patient will demonstrate increase in gait speed to >/= 2.62 ft/sec with LRAD to promote increased safety with community ambulation    Status  New    Target Date  11/01/18      PT LONG TERM GOAL #6    Title  Patient will verbalize understanding of online or community based exercise programs geared toward Parkinson's disease to promote ongoing wellness    Status  New    Target Date  11/01/18             Plan - 09/06/18 1421    Clinical Impression Statement  Bill Guerrero is a 68 y/o male referred to OP PT for Parkinson's disease. Pt reports initial diagnosis of Parkinson's ~5 years ago with no formal PT for Parkinson's but did have 3 visits with PT in Dec 2019 for LBP which he attributes to the progressively stooped posture due to Parkinson's. He denies any h/o recent falls since Dec 2019, but states he was falling on a regular basis prior to then. He arrives to clinic ambulating with a SPC demonstrating a significantly stooped posture with forward head, rounded shoulders and increased thoracic kyphosis and decreased lumbar lordosis. LE strength deficits range from mild to moderate with greatest weakness proximally. Gait speed significantly decreased at 1.76 ft/sec, TUG time increased to 21.6 sec, and Berg = 34/56, all indicating a high risk for recurrent falls. Bill Guerrero will benefit from skilled PT for core and LE stability/strength training to decrease LBP and improve posture and balance, along with balance and dynamic gait training incorporating PWR! Moves to improve safety and decrease risk for falls.    Personal Factors and Comorbidities  Comorbidity 3+;Time since onset of injury/illness/exacerbation;Fitness;Past/Current Experience;Transportation    Comorbidities  chronic LBP; s/p cervical & lumbar laminectomies; stage 4 metastatic melanoma - currently in remission; L THR; R RTC repair; bipolar; anxiety; depression    Examination-Activity Limitations  Bathing;Bed Mobility;Dressing;Hygiene/Grooming;Locomotion Level;Squat;Stand;Transfers    Examination-Participation Restrictions  Cleaning;Driving;Laundry;Shop;Yard Work    Stability/Clinical Decision Making  Unstable/Unpredictable    Clinical  Decision Making  High    Rehab Potential  Good    PT Frequency  2x / week  PT Duration  6 weeks    PT Treatment/Interventions  Patient/family education;Neuromuscular re-education;Therapeutic exercise;Therapeutic activities;Functional mobility training;Gait training;Stair training;Balance training;Manual techniques;Dry needling;Taping;Electrical Stimulation;Moist Heat;ADLs/Self Care Home Management    PT Next Visit Plan  Assess orthostatic BP; assess 5x STS; Initiate core/LE strengthening HEP    Consulted and Agree with Plan of Care  Patient       Patient will benefit from skilled therapeutic intervention in order to improve the following deficits and impairments:  Decreased balance, Abnormal gait, Difficulty walking, Decreased mobility, Decreased strength, Decreased activity tolerance, Decreased safety awareness, Decreased knowledge of use of DME, Postural dysfunction, Improper body mechanics, Pain, Dizziness  Visit Diagnosis: 1. Unsteadiness on feet   2. Difficulty in walking, not elsewhere classified   3. Muscle weakness (generalized)   4. Repeated falls   5. Chronic bilateral low back pain without sciatica        Problem List Patient Active Problem List   Diagnosis Date Noted  . Chronic midline low back pain without sciatica 12/06/2017  . Lumbar post-laminectomy syndrome 12/06/2017  . Cervical post-laminectomy syndrome 12/06/2017  . Parkinson's disease ([place]) 12/06/2017  . Malfunction of penile prosthesis ([place]) 10/09/2012  . Hyperlipemia   . Bipolar 1 disorder ([place])     Percival Spanish, PT, MPT 09/06/2018, 6:42 PM  [facility] [address]  [place], [place], [address] Phone: [phone]   Fax:  [phone]  Name: Bill Guerrero MRN: [id number] Date of Birth: August 30, 1950

## 2018-09-08 ENCOUNTER — Other Ambulatory Visit: Payer: Self-pay

## 2018-09-08 ENCOUNTER — Ambulatory Visit: Payer: Medicare Other | Admitting: Physical Therapy

## 2018-09-08 DIAGNOSIS — M545 Low back pain, unspecified: Secondary | ICD-10-CM

## 2018-09-08 DIAGNOSIS — G8929 Other chronic pain: Secondary | ICD-10-CM | POA: Diagnosis not present

## 2018-09-08 DIAGNOSIS — M6281 Muscle weakness (generalized): Secondary | ICD-10-CM

## 2018-09-08 DIAGNOSIS — R296 Repeated falls: Secondary | ICD-10-CM | POA: Diagnosis not present

## 2018-09-08 DIAGNOSIS — R262 Difficulty in walking, not elsewhere classified: Secondary | ICD-10-CM

## 2018-09-08 DIAGNOSIS — R2681 Unsteadiness on feet: Secondary | ICD-10-CM

## 2018-09-08 NOTE — Therapy (Signed)
[facility] [address]  [place], [place], [address] Phone: [phone]   Fax:  [phone]  Physical Therapy Treatment  Patient Details  Name: Bill Guerrero MRN: [id number] Date of Birth: 12/21/1950 Referring Provider (Bill Guerrero): Alonza Bogus, MD   Encounter Date: 09/08/2018  Bill Guerrero End of Session - 09/08/18 1309    Visit Number  2    Number of Visits  16    Date for Bill Guerrero Re-Evaluation  11/01/18    Authorization Type  Medicare & AARP    Bill Guerrero Start Time  1309    Bill Guerrero Stop Time  1359    Bill Guerrero Time Calculation (min)  50 min    Activity Tolerance  Patient tolerated treatment well    Behavior During Therapy  [facility] for tasks assessed/performed       Past Medical History:  Diagnosis Date  . Arthritis    hips replacement  . Bipolar 1 disorder ([place])   . Hyperlipemia   . Hypertension   . Infection of prosthesis ([place])    penile implant with abscess with drainage of scrotum -"pinkish tan""no odor"  . Multiple body piercings   . Tremor    RT HAND    Past Surgical History:  Procedure Laterality Date  . BACK SURGERY     CERVICAL AND LUMBAR  . GROIN MASS OPEN BIOPSY     MASS REMOVED FROM GROIN  . HEMORROIDECTOMY    . HERNIA REPAIR     BIL IN HERNIA  . JOINT REPLACEMENT     TOTAL LEFT HIP  . PENILE PROSTHESIS IMPLANT N/A 06/17/2012   Procedure: THREE PIECE PENILE PROTHESIS INFLATABLE-COLOPLAST (PENILE SCROTAL APPROACH);  Surgeon: Fredricka Bonine, MD;  Location: [facility];  Service: Urology;  Laterality: N/A;  . PENILE PROSTHESIS IMPLANT N/A 10/07/2012   Procedure: PENILE PROTHESIS REMOVAL AND REPLACEMENT INFLATABLE, PENILE SCROTAL APPROACH;  Surgeon: Fredricka Bonine, MD;  Location: [facility];  Service: Urology;  Laterality: N/A;  . PILONIDAL CYST EXCISION  1986  . ROTATOR CUFF REPAIR     RIGHT  . VASECTOMY Bilateral 06/17/2012   Procedure: VASECTOMY;  Surgeon: Fredricka Bonine, MD;  Location: [facility];  Service:  Urology;  Laterality: Bilateral;    Vitals:   09/08/18 1317 09/08/18 1319 09/08/18 1321  SpO2: 96% 97% 97%    Subjective Assessment - 09/08/18 1313    Subjective  Bill Guerrero reporting he is not having as good of a day today. Did not have any dizzy episodes that he can recall yesterday.    Pertinent History  Parkinson's x 5 yrs    Patient Stated Goals  "be able to join [employer] boxing class (long term); be able to take care of more things around the house"    Currently in Pain?  Yes    Pain Score  6     Pain Location  Back    Pain Orientation  Lower    Pain Type  Chronic pain    Pain Radiating Towards  4/10 into hips    Pain Frequency  Intermittent         [facility] Bill Guerrero Assessment - 09/08/18 1309      Standardized Balance Assessment   Five times sit to stand comments   22.35 sec with UE push from armrests         Vestibular Assessment - 09/08/18 1309      Orthostatics   BP supine (x 5 minutes)  148/76  HR supine (x 5 minutes)  68    BP sitting  140/70    HR sitting  73    BP standing (after 1 minute)  124/70    HR standing (after 1 minute)  73                  PWR ([facility]) - 09/08/18 1309    PWR! exercises  Moves in sitting    PWR! Sit to Stand  x 5    PWR! Up  x10    PWR! Rock  x10    PWR! Twist  x10    PWR! Step  x10    Comments  Educated patient on carryover of PWR! movement patterns into daily activities          Bill Guerrero Education - 09/08/18 1358    Education Details  PWR! Moves at a [employer], Why Practice in all 5 positions?, Initial instruction in [place]! Moves in Sitting    Person(s) Educated  Patient    Methods  Explanation;Demonstration;Verbal cues;Tactile cues;Handout    Comprehension  Verbalized understanding;Returned demonstration;Verbal cues required;Tactile cues required;Need further instruction       Bill Guerrero Short Term Goals - 09/06/18 1421      Bill Guerrero SHORT TERM GOAL #1   Title  Independent with initial HEP to address core/ LE flexibility and  strengthening    Status  New    Target Date  09/27/18        Bill Guerrero Long Term Goals - 09/06/18 1421      Bill Guerrero LONG TERM GOAL #1   Title  Independent with ongoing HEP incorporating PWR! Moves as indicated    Status  New    Target Date  11/01/18      Bill Guerrero LONG TERM GOAL #2   Title  B LE strength >/= 4/5 to 4+/5 for improved stability    Status  New    Target Date  11/01/18      Bill Guerrero LONG TERM GOAL #3   Title  Patient to score <14 sec on TUG testing with LRAD to decrease risk of falls.    Status  New    Target Date  11/01/18      Bill Guerrero LONG TERM GOAL #4   Title  Patient to score </=16 sec on 5x STS without UEs to decrease risk of falls.    Status  New    Target Date  11/01/18      Bill Guerrero LONG TERM GOAL #5   Title  Patient will demonstrate increase in gait speed to >/= 2.62 ft/sec with LRAD to promote increased safety with community ambulation    Status  New    Target Date  11/01/18      Bill Guerrero LONG TERM GOAL #6   Title  Patient will verbalize understanding of online or community based exercise programs geared toward Parkinson's disease to promote ongoing wellness    Status  New    Target Date  11/01/18            Plan - 09/08/18 1358    Clinical Impression Statement  Bill Guerrero reporting today not one of his better days but he has not noted any dizzy episodes in the last 2 days - states episodes are typically intermittent but will occur in clusters with multiple episodes on the same day. Orthostatic BP assessed with some drop in BP (total drop >20 mm Hg but individual positional changes <20 mm Hg) but patient asymptomatic today. Complete 5x STS testing with  patient requiring UE assist to complete transfers and time indicating increased risk for falls. Provided background education on role/purpose of PWR! Moves and introduced PWR! Moves in sitting with patient able to perform all moves but with some limited motion during Twist and Step motions.    Comorbidities  chronic LBP; s/p cervical & lumbar  laminectomies; stage 4 metastatic melanoma - currently in remission; L THR; R RTC repair; bipolar; anxiety; depression    Bill Guerrero Frequency  2x / week    Bill Guerrero Duration  8 weeks    Bill Guerrero Treatment/Interventions  Patient/family education;Neuromuscular re-education;Therapeutic exercise;Therapeutic activities;Functional mobility training;Gait training;Stair training;Balance training;Manual techniques;Dry needling;Taping;Electrical Stimulation;Moist Heat;ADLs/Self Care Home Management    Bill Guerrero Next Visit Plan  Review seated PWR! Moves; Initiate core/LE strengthening HEP    Consulted and Agree with Plan of Care  Patient       Patient will benefit from skilled therapeutic intervention in order to improve the following deficits and impairments:  Decreased balance, Abnormal gait, Difficulty walking, Decreased mobility, Decreased strength, Decreased activity tolerance, Decreased safety awareness, Decreased knowledge of use of DME, Postural dysfunction, Improper body mechanics, Pain, Dizziness  Visit Diagnosis: 1. Unsteadiness on feet   2. Difficulty in walking, not elsewhere classified   3. Muscle weakness (generalized)   4. Repeated falls   5. Chronic bilateral low back pain without sciatica        Problem List Patient Active Problem List   Diagnosis Date Noted  . Chronic midline low back pain without sciatica 12/06/2017  . Lumbar post-laminectomy syndrome 12/06/2017  . Cervical post-laminectomy syndrome 12/06/2017  . Parkinson's disease ([place]) 12/06/2017  . Malfunction of penile prosthesis ([place]) 10/09/2012  . Hyperlipemia   . Bipolar 1 disorder ([place])     Bill Guerrero, Bill Guerrero, Bill Guerrero 09/08/2018, 2:29 PM  [facility] [address]  [place], [place], [address] Phone: [phone]   Fax:  [phone]  Name: Bill Guerrero MRN: [id number] Date of Birth: 13-Apr-1950

## 2018-09-12 ENCOUNTER — Ambulatory Visit: Payer: Medicare Other | Admitting: Physical Therapy

## 2018-09-12 ENCOUNTER — Encounter: Payer: Self-pay | Admitting: Physical Therapy

## 2018-09-12 ENCOUNTER — Other Ambulatory Visit: Payer: Self-pay

## 2018-09-12 DIAGNOSIS — M545 Low back pain, unspecified: Secondary | ICD-10-CM

## 2018-09-12 DIAGNOSIS — R296 Repeated falls: Secondary | ICD-10-CM | POA: Diagnosis not present

## 2018-09-12 DIAGNOSIS — R262 Difficulty in walking, not elsewhere classified: Secondary | ICD-10-CM | POA: Diagnosis not present

## 2018-09-12 DIAGNOSIS — R2681 Unsteadiness on feet: Secondary | ICD-10-CM | POA: Diagnosis not present

## 2018-09-12 DIAGNOSIS — M6281 Muscle weakness (generalized): Secondary | ICD-10-CM | POA: Diagnosis not present

## 2018-09-12 DIAGNOSIS — G8929 Other chronic pain: Secondary | ICD-10-CM

## 2018-09-12 NOTE — Therapy (Signed)
[facility] [address]  [place], [place], [address] Phone: [phone]   Fax:  [phone]  Physical Therapy Treatment  Patient Details  Name: Bill Guerrero MRN: [id number] Date of Birth: October 04, 1950 Referring Provider (PT): Alonza Bogus, MD   Encounter Date: 09/12/2018  PT End of Session - 09/12/18 1311    Visit Number  3    Number of Visits  16    Date for PT Re-Evaluation  11/01/18    Authorization Type  Medicare & AARP    PT Start Time  1311    PT Stop Time  1357    PT Time Calculation (min)  46 min    Activity Tolerance  Patient tolerated treatment well    Behavior During Therapy  [facility] for tasks assessed/performed       Past Medical History:  Diagnosis Date  . Arthritis    hips replacement  . Bipolar 1 disorder ([place])   . Hyperlipemia   . Hypertension   . Infection of prosthesis ([place])    penile implant with abscess with drainage of scrotum -"pinkish tan""no odor"  . Multiple body piercings   . Tremor    RT HAND    Past Surgical History:  Procedure Laterality Date  . BACK SURGERY     CERVICAL AND LUMBAR  . GROIN MASS OPEN BIOPSY     MASS REMOVED FROM GROIN  . HEMORROIDECTOMY    . HERNIA REPAIR     BIL IN HERNIA  . JOINT REPLACEMENT     TOTAL LEFT HIP  . PENILE PROSTHESIS IMPLANT N/A 06/17/2012   Procedure: THREE PIECE PENILE PROTHESIS INFLATABLE-COLOPLAST (PENILE SCROTAL APPROACH);  Surgeon: Fredricka Bonine, MD;  [facility]: [facility];  Service: Urology;  Laterality: N/A;  . PENILE PROSTHESIS IMPLANT N/A 10/07/2012   Procedure: PENILE PROTHESIS REMOVAL AND REPLACEMENT INFLATABLE, PENILE SCROTAL APPROACH;  Surgeon: Fredricka Bonine, MD;  [facility];  Service: Urology;  Laterality: N/A;  . PILONIDAL CYST EXCISION  1986  . ROTATOR CUFF REPAIR     RIGHT  . VASECTOMY Bilateral 06/17/2012   Procedure: VASECTOMY;  Surgeon: Fredricka Bonine, MD;  [facility];  Service:  Urology;  Laterality: Bilateral;    There were no vitals filed for this visit.  Subjective Assessment - 09/12/18 1312    Subjective  Pt reporting increased leg, back and neck soreness over past few days, today being the worst - was unable to shower today due to this.    Pertinent History  Parkinson's x 5 yrs    Patient Stated Goals  "be able to join Rock-[employer] boxing class (long term); be able to take care of more things around the house"    Currently in Pain?  Yes    Pain Score  2     Pain Location  Back   and neck   Pain Orientation  Lower    Pain Descriptors / Indicators  Sore    Pain Type  Chronic pain                       [facility] Adult PT Treatment/Exercise - 09/12/18 1311      Ambulation/Gait   Ambulation/Gait Assistance  5: Supervision    Ambulation/Gait Assistance Details  Patient instructed in height adjustment for rollator to promote improved posture and safety. Provided instruction in safe use of rollator for ambulation, emphasizing upright posture and good rollator proximity with targeted foot  placement between rear wheels of walker.    Ambulation Distance (Feet)  120 Feet    Assistive device  4-wheeled walker    Gait Pattern  Step-through pattern;Trunk flexed;Decreased hip/knee flexion - right;Decreased hip/knee flexion - left;Poor foot clearance - left;Poor foot clearance - right    Ambulation Surface  Level;Indoor    Gait Comments  Better posture and foor clearance observed with rollator than with cane.      Exercises   Exercises  Knee/Hip      Knee/Hip Exercises: Stretches   Passive Hamstring Stretch  Right;Left;30 seconds;1 rep    Passive Hamstring Stretch Limitations  seated hip hinge    Hip Flexor Stretch  Right;Left;30 seconds;1 rep    Hip Flexor Stretch Limitations  seated with leg extended back over side of chair      Knee/Hip Exercises: Aerobic   Nustep  L2 x 5 min - UE/LE to promote reciprocal motion      Knee/Hip Exercises: Seated    Ball Squeeze  10 x 5 sec    Clamshell with TheraBand  Yellow   alt hip ABD/ER x 10   Marching  Both;10 reps;Strengthening    Marching Limitations  yellow TB        [facility]) - 09/12/18 1311    PWR! exercises  Moves in sitting    PWR! Up  x10    PWR! Rock  x10    PWR! Twist  x10    PWR! Step  x10   only single leg step         PT Education - 09/12/18 1357    Education Details  HEP update - seated stretches and LE strengthening    Person(s) Educated  Patient    Methods  Explanation;Demonstration;Handout    Comprehension  Verbalized understanding;Returned demonstration;Need further instruction       PT Short Term Goals - 09/12/18 1357      PT SHORT TERM GOAL #1   Title  Independent with initial HEP to address core/ LE flexibility and strengthening    Status  On-going    Target Date  09/27/18        PT Long Term Goals - 09/12/18 1357      PT LONG TERM GOAL #1   Title  Independent with ongoing HEP incorporating PWR! Moves as indicated    Status  On-going    Target Date  11/01/18      PT LONG TERM GOAL #2   Title  B LE strength >/= 4/5 to 4+/5 for improved stability    Status  On-going    Target Date  11/01/18      PT LONG TERM GOAL #3   Title  Patient to score <14 sec on TUG testing with LRAD to decrease risk of falls.    Status  On-going    Target Date  11/01/18      PT LONG TERM GOAL #4   Title  Patient to score </=16 sec on 5x STS without UEs to decrease risk of falls.    Status  On-going    Target Date  11/01/18      PT LONG TERM GOAL #5   Title  Patient will demonstrate increase in gait speed to >/= 2.62 ft/sec with LRAD to promote increased safety with community ambulation    Status  On-going    Target Date  11/01/18      PT LONG TERM GOAL #6   Title  Patient will verbalize understanding of  online or community based exercise programs geared toward Parkinson's disease to promote ongoing wellness    Status  On-going    Target Date  11/01/18             Plan - 09/12/18 1316    Clinical Impression Statement  Bill Guerrero reporting increased neck, back and LE soreness over past few days but unclear if secondary to PT exercises or unrelated. Reviewed seated PWR! Moves with good tolerance other than continued need for boom-whackers to enable reach with PWR! Twist and inability to move both feet during PWR! Step, therefore modified for single LE at present. Patient reporting he purchased a rollator style RW but was not sure it was adjusted tall enough, therefore provided instruction in landmarks for height adjustment as well as overview of safe transfers and gait with rollator - suggested patient bring rollator to next session if further adjustments necessary. Remainder of session focusing on seated stretches and basic LE strengthening to facilitate increased ease of mobility.    Comorbidities  chronic LBP; s/p cervical & lumbar laminectomies; stage 4 metastatic melanoma - currently in remission; L THR; R RTC repair; bipolar; anxiety; depression    PT Frequency  2x / week    PT Duration  8 weeks    PT Treatment/Interventions  Patient/family education;Neuromuscular re-education;Therapeutic exercise;Therapeutic activities;Functional mobility training;Gait training;Stair training;Balance training;Manual techniques;Dry needling;Taping;Electrical Stimulation;Moist Heat;ADLs/Self Care Home Management    PT Next Visit Plan  Review seated PWR! Moves, LE stretching & core/LE strengthening HEP; Gait training with rollator; Education in tips to prevent freezing of gait    Consulted and Agree with Plan of Care  Patient       Patient will benefit from skilled therapeutic intervention in order to improve the following deficits and impairments:  Decreased balance, Abnormal gait, Difficulty walking, Decreased mobility, Decreased strength, Decreased activity tolerance, Decreased safety awareness, Decreased knowledge of use of DME, Postural dysfunction, Improper body  mechanics, Pain, Dizziness  Visit Diagnosis: 1. Unsteadiness on feet   2. Difficulty in walking, not elsewhere classified   3. Muscle weakness (generalized)   4. Repeated falls   5. Chronic bilateral low back pain without sciatica        Problem List Patient Active Problem List   Diagnosis Date Noted  . Chronic midline low back pain without sciatica 12/06/2017  . Lumbar post-laminectomy syndrome 12/06/2017  . Cervical post-laminectomy syndrome 12/06/2017  . Parkinson's disease ([place]) 12/06/2017  . 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 Hyperlipemia   . Bipolar 1 disorder ([place])     Percival Spanish, PT, MPT 09/12/2018, 3:26 PM  [facility] [address]  [place], [place], [address] Phone: [phone]   Fax:  [phone]  Name: Bill Guerrero MRN: [id number] Date of Birth: 01-01-1951

## 2018-09-15 ENCOUNTER — Ambulatory Visit: Payer: Medicare Other | Admitting: Physical Therapy

## 2018-09-18 ENCOUNTER — Other Ambulatory Visit: Payer: Self-pay | Admitting: Psychiatry

## 2018-09-19 ENCOUNTER — Ambulatory Visit: Payer: Medicare Other | Admitting: Physical Therapy

## 2018-09-19 ENCOUNTER — Other Ambulatory Visit: Payer: Self-pay

## 2018-09-19 ENCOUNTER — Encounter: Payer: Self-pay | Admitting: Physical Therapy

## 2018-09-19 DIAGNOSIS — M6281 Muscle weakness (generalized): Secondary | ICD-10-CM

## 2018-09-19 DIAGNOSIS — R2681 Unsteadiness on feet: Secondary | ICD-10-CM | POA: Diagnosis not present

## 2018-09-19 DIAGNOSIS — M545 Low back pain, unspecified: Secondary | ICD-10-CM

## 2018-09-19 DIAGNOSIS — R262 Difficulty in walking, not elsewhere classified: Secondary | ICD-10-CM

## 2018-09-19 DIAGNOSIS — G8929 Other chronic pain: Secondary | ICD-10-CM

## 2018-09-19 DIAGNOSIS — R296 Repeated falls: Secondary | ICD-10-CM

## 2018-09-19 NOTE — Therapy (Signed)
[facility] [address]  [place], [place], [address] Phone: [phone]   Fax:  [phone]  Physical Therapy Treatment  Patient Details  Name: Bill Guerrero MRN: [id number] Date of Birth: 15-Apr-1950 Referring Provider (PT): Alonza Bogus, MD   Encounter Date: 09/19/2018  PT End of Session - 09/19/18 1316    Visit Number  4    Number of Visits  16    Date for PT Re-Evaluation  11/01/18    Authorization Type  Medicare & AARP    PT Start Time  1316    PT Stop Time  1401    PT Time Calculation (min)  45 min    Activity Tolerance  Patient tolerated treatment well    Behavior During Therapy  [facility] for tasks assessed/performed       Past Medical History:  Diagnosis Date  . Arthritis    hips replacement  . Bipolar 1 disorder ([place])   . Hyperlipemia   . Hypertension   . Infection of prosthesis ([place])    penile implant with abscess with drainage of scrotum -"pinkish tan""no odor"  . Multiple body piercings   . Tremor    RT HAND    Past Surgical History:  Procedure Laterality Date  . BACK SURGERY     CERVICAL AND LUMBAR  . GROIN MASS OPEN BIOPSY     MASS REMOVED FROM GROIN  . HEMORROIDECTOMY    . HERNIA REPAIR     BIL IN HERNIA  . JOINT REPLACEMENT     TOTAL LEFT HIP  . PENILE PROSTHESIS IMPLANT N/A 06/17/2012   Procedure: THREE PIECE PENILE PROTHESIS INFLATABLE-COLOPLAST (PENILE SCROTAL APPROACH);  Surgeon: Fredricka Bonine, MD;  [facility];  Service: Urology;  Laterality: N/A;  . PENILE PROSTHESIS IMPLANT N/A 10/07/2012   Procedure: PENILE PROTHESIS REMOVAL AND REPLACEMENT INFLATABLE, PENILE SCROTAL APPROACH;  Surgeon: Fredricka Bonine, MD;  [facility];  Service: Urology;  Laterality: N/A;  . PILONIDAL CYST EXCISION  1986  . ROTATOR CUFF REPAIR     RIGHT  . VASECTOMY Bilateral 06/17/2012   Procedure: VASECTOMY;  Surgeon: Fredricka Bonine, MD;  [facility];  Service:  Urology;  Laterality: Bilateral;    There were no vitals filed for this visit.  Subjective Assessment - 09/19/18 1323    Subjective  Pt reporting he is not having a good day today - back & hip pain was up to 8/10 this morning. Brought his new rollator to assess for potential height adjustment.    Pertinent History  Parkinson's x 5 yrs    Patient Stated Goals  "be able to join Rock-[employer] boxing class (long term); be able to take care of more things around the house"    Currently in Pain?  Yes    Pain Score  5     Pain Location  Back   & hips   Pain Orientation  Lower    Pain Descriptors / Indicators  Burning    Pain Type  Chronic pain    Pain Frequency  Intermittent                       [facility] Adult PT Treatment/Exercise - 09/19/18 1316      Exercises   Exercises  Knee/Hip      Knee/Hip Exercises: Stretches   Passive Hamstring Stretch  Right;Left;30 seconds;1 rep    Passive Hamstring Stretch Limitations  seated hip hinge  Hip Flexor Stretch  Right;Left;30 seconds;1 rep    Hip Flexor Stretch Limitations  seated with leg extended back over side of chair      Knee/Hip Exercises: Aerobic   Nustep  L3 x 6 min - UE/LE to promote reciprocal motion      Knee/Hip Exercises: Seated   Ball Squeeze  10 x 5 sec    Clamshell with TheraBand  Yellow   alt hip ABD/ER x 10   Marching  Both;10 reps;Strengthening    Marching Limitations  yellow TB        [facility]) - 09/19/18 1316    PWR! exercises  Moves in sitting    PWR! Up  x10    PWR! Rock  x10    PWR! Twist  x10    PWR! Step  x10   only single leg step         PT Education - 09/19/18 1838    Education Details  Rollator safety with transfers and gait;Tips to reduce freezing episodes with standing or walking; [employer] for senior focused exercise programs including Tai Chi programs at patient request    Person(s) Educated  Patient    Methods  Explanation;Demonstration;Handout    Comprehension   Verbalized understanding;Need further instruction       PT Short Term Goals - 09/12/18 1357      PT SHORT TERM GOAL #1   Title  Independent with initial HEP to address core/ LE flexibility and strengthening    Status  On-going    Target Date  09/27/18        PT Long Term Goals - 09/12/18 1357      PT LONG TERM GOAL #1   Title  Independent with ongoing HEP incorporating PWR! Moves as indicated    Status  On-going    Target Date  11/01/18      PT LONG TERM GOAL #2   Title  B LE strength >/= 4/5 to 4+/5 for improved stability    Status  On-going    Target Date  11/01/18      PT LONG TERM GOAL #3   Title  Patient to score <14 sec on TUG testing with LRAD to decrease risk of falls.    Status  On-going    Target Date  11/01/18      PT LONG TERM GOAL #4   Title  Patient to score </=16 sec on 5x STS without UEs to decrease risk of falls.    Status  On-going    Target Date  11/01/18      PT LONG TERM GOAL #5   Title  Patient will demonstrate increase in gait speed to >/= 2.62 ft/sec with LRAD to promote increased safety with community ambulation    Status  On-going    Target Date  11/01/18      PT LONG TERM GOAL #6   Title  Patient will verbalize understanding of online or community based exercise programs geared toward Parkinson's disease to promote ongoing wellness    Status  On-going    Target Date  11/01/18            Plan - 09/19/18 1326    Clinical Impression Statement  Sevin arriving to PT today with new rollator style RW however maximal height adjustment slightly shy of proper height for patient - patient made aware of this, specifically concerns regarding stooped posture and impact on LBP and fall risk as well as potential worsening of freezing  episodes. Provided education in tips to reduce freezing episodes with standing or walking. Reviewed initial stretching and strengthening HEP clarifying positioning and hold times. Seated PWR! Moves completed with patient  demonstrating improving trunk rotation to R with PWR! Twist but still unable to meet hands with Twist to L, and still unable to move trailing foot with PWR! Step.    Comorbidities  chronic LBP; s/p cervical & lumbar laminectomies; stage 4 metastatic melanoma - currently in remission; L THR; R RTC repair; bipolar; anxiety; depression    PT Frequency  2x / week    PT Duration  8 weeks    PT Treatment/Interventions  Patient/family education;Neuromuscular re-education;Therapeutic exercise;Therapeutic activities;Functional mobility training;Gait training;Stair training;Balance training;Manual techniques;Dry needling;Taping;Electrical Stimulation;Moist Heat;ADLs/Self Care Home Management    PT Next Visit Plan  Review seated PWR! Moves, LE stretching & core/LE strengthening HEP; Gait training with rollator; Education in tips to prevent freezing of gait    Consulted and Agree with Plan of Care  Patient       Patient will benefit from skilled therapeutic intervention in order to improve the following deficits and impairments:  Decreased balance, Abnormal gait, Difficulty walking, Decreased mobility, Decreased strength, Decreased activity tolerance, Decreased safety awareness, Decreased knowledge of use of DME, Postural dysfunction, Improper body mechanics, Pain, Dizziness  Visit Diagnosis: 1. Unsteadiness on feet   2. Difficulty in walking, not elsewhere classified   3. Muscle weakness (generalized)   4. Repeated falls   5. Chronic bilateral low back pain without sciatica        Problem List Patient Active Problem List   Diagnosis Date Noted  . Chronic midline low back pain without sciatica 12/06/2017  . Lumbar post-laminectomy syndrome 12/06/2017  . Cervical post-laminectomy syndrome 12/06/2017  . Parkinson's disease ([place]) 12/06/2017  . Malfunction of penile prosthesis ([place]) 10/09/2012  . Hyperlipemia   . Bipolar 1 disorder ([place])     Percival Spanish, PT, MPT 09/19/2018, 6:59 PM  [facility] [address]  [place], [place], [address] Phone: [phone]   Fax:  [phone]  Name: KAYIN OSMENT MRN: [id number] Date of Birth: 1950/06/03

## 2018-09-19 NOTE — Patient Instructions (Addendum)
Tips to reduce freezing episodes with standing or walking:  1. Stand tall with your feet wide, so that you can rock and weight shift through your hips. 2. Don't try to fight the freeze: if you begin taking slower, faster, smaller steps, STOP, get your posture tall, and RESET your posture and balance.  Take a deep breath before taking the BIG step to start again. 3. March in place, with high knee stepping, to get started walking again. 4. Use auditory cues:  Count out loud, think of a familiar tune or song or cadence, use pocket metronome, to use rhythm to get started walking again. 5. Use visual cues:  Use a line to step over, use laser pointer line to step over, (using BIG steps) to start walking again. 6. Use visual targets to keep your posture tall (look ahead and focus on an object or target at eye level). 7. As you approach where your destination with walking, count your steps out loud and/or focus on your target with your eyes until you are fully there. 8. Use appropriate assistive device, as advised by your physical therapist to assist with taking longer, consistent steps. 9. When approaching a seat/chair, try to curve in from the side with a wide turn rather than a straight on approach.    Community Occupational psychologist of Services Cost  A Matter of Balance Class locations vary. Call [place] on Aging for more information.  http://dawson-may.com/ ([phone] 8-Session program addressing the fear of falling and increasing activity levels of older adults Free to minimal cost  A.C.T. By [employer] [address] [place], [place] [address].  BetaBlues.dk [phone]  Personal training, gym, classes including Silver Sneakers* and ACTion for Aging Adults Fee-based  A.H.O.Y. (Add Health to [place]) Airs on Time [employer] 13, M-F at [place]: [employer],  [place] [facility] [place],  [place], [place] [facility], [address] [facility], [place], [place], [place] [address]  [place] Location: Sharrell Ku. [employer] [place]      [phone]  [phone]  [phone]  [phone]  [phone]  [phone]  [phone]  [phone]  [phone]  [phone]    [phone] A total-body conditioning class for adults 64 and older; designed to increase muscular strength, endurance, range of movement, flexibility, balance, agility and coordination Free  [facility] [place], [place] [address] [place]      [address]      [phone]      Pilate's class for individualsreturning to exercise after an injury, before or after surgery or for individuals with complex musculoskeletal issues; designed to improve strength, balance , flexibility      $15/class  [place] [address] [place], [place] [address] www.[email or web address].dk [place] classes for beginners to advanced [place], [place] [address] [email or web address] www.[email or web address]-[email or web address]-[email or web address]/sr.center.cfm [place] Chair Exercises Free, ages 35 and older; Ages 24-59 fee based  Marvia Pickles, Senior  Center [address] [place], [place] [address] [email or web address] .gov [phone]  A.H.O.Y. Tai Chi Fee-based Donation based or free  [place] Class locations vary.  Call or email Angela Burke or view  website for more information. Info'[email or web address] .[email or web address] GainPain.com.cy.html [phone] Ongoing classes at local YMC[employer] and gyms Fee-based  Silver Sneakers A.C.T. By [place] [employer]: [place] [phone]  [phone] Classes designed for older adults who want to improve their strength, flexibility, balance and endurance.   Silver sneakers is covered by some insurance plans and includes a fitness center membership at participating locations. Find out more by calling [phone] or visiting www.silversneakers.com Covered by some insurance plans  [facility] [place] [phone] A.H.O.Y., fitness room, personal training, fitness classes for injury prevention, strength, balance, flexibility, water fitness classes Ages 55+: $41 for 6 months; Ages 16-54: $68 for 6 months  Tai Chi for Everybody [facility] [address] [place], [place] [address] [email or web address] .Patsi Sears [phone] Tai Chi classes for beginners to advanced; geared for seniors Donation Based      [employer] ([place] Others Participate in Exercise     Loyal Gambler. Rosana Hoes, PhD, [place] [email or web address] [place]     [phone]     A comprehensive fitness program for adults.  The program paris senior-level undergraduates Kinesiology students with adults who desire to learn how to exercise safely.  Includes a structural exercise class focusing on functional fitnesss     $100/semester in fall and spring; $75 in summer (no trainers)    *Silver Sneakers is covered by some Personal assistant and includes  a  Radio producer at participating locations.  Find out more by calling [phone] or visiting www.[email or web address]  For additional health and human services resources for senior adults, please contact SeniorLine at [phone] in [place] and [place] at [phone] in all other areas.

## 2018-09-22 ENCOUNTER — Other Ambulatory Visit: Payer: Self-pay

## 2018-09-22 ENCOUNTER — Ambulatory Visit: Payer: Medicare Other | Admitting: Physical Therapy

## 2018-09-22 ENCOUNTER — Encounter: Payer: Self-pay | Admitting: Physical Therapy

## 2018-09-22 DIAGNOSIS — G8929 Other chronic pain: Secondary | ICD-10-CM

## 2018-09-22 DIAGNOSIS — R296 Repeated falls: Secondary | ICD-10-CM | POA: Diagnosis not present

## 2018-09-22 DIAGNOSIS — R262 Difficulty in walking, not elsewhere classified: Secondary | ICD-10-CM

## 2018-09-22 DIAGNOSIS — M545 Low back pain, unspecified: Secondary | ICD-10-CM

## 2018-09-22 DIAGNOSIS — R2681 Unsteadiness on feet: Secondary | ICD-10-CM | POA: Diagnosis not present

## 2018-09-22 DIAGNOSIS — M6281 Muscle weakness (generalized): Secondary | ICD-10-CM

## 2018-09-22 NOTE — Therapy (Signed)
[facility] [address]  [place], [place], [address] Phone: [phone]   Fax:  [phone]  Physical Therapy Treatment  Patient Details  Name: Bill Guerrero MRN: [id number] Date of Birth: [birth year] Referring Provider (PT): Alonza Bogus, MD   Encounter Date: 09/22/2018  PT End of Session - 09/22/18 1440    Visit Number  5    Number of Visits  16    Date for PT Re-Evaluation  11/01/18    Authorization Type  Medicare & AARP    PT Start Time  1440    PT Stop Time  1529    PT Time Calculation (min)  49 min    Activity Tolerance  Patient tolerated treatment well    Behavior During Therapy  [facility] for tasks assessed/performed       Past Medical History:  Diagnosis Date  . Arthritis    hips replacement  . Bipolar 1 disorder ([place])   . Hyperlipemia   . Hypertension   . Infection of prosthesis ([place])    penile implant with abscess with drainage of scrotum -"pinkish tan""no odor"  . Multiple body piercings   . Tremor    RT HAND    Past Surgical History:  Procedure Laterality Date  . BACK SURGERY     CERVICAL AND LUMBAR  . GROIN MASS OPEN BIOPSY     MASS REMOVED FROM GROIN  . HEMORROIDECTOMY    . HERNIA REPAIR     BIL IN HERNIA  . JOINT REPLACEMENT     TOTAL LEFT HIP  . PENILE PROSTHESIS IMPLANT N/A 06/17/2012   Procedure: THREE PIECE PENILE PROTHESIS INFLATABLE-COLOPLAST (PENILE SCROTAL APPROACH);  Surgeon: Fredricka Bonine, MD;  Location: [facility];  Service: Urology;  Laterality: N/A;  . PENILE PROSTHESIS IMPLANT N/A 10/07/2012   Procedure: PENILE PROTHESIS REMOVAL AND REPLACEMENT INFLATABLE, PENILE SCROTAL APPROACH;  Surgeon: Fredricka Bonine, MD;  [facility];  Service: Urology;  Laterality: N/A;  . PILONIDAL CYST EXCISION  1986  . ROTATOR CUFF REPAIR     RIGHT  . VASECTOMY Bilateral 06/17/2012   Procedure: VASECTOMY;  Surgeon: Fredricka Bonine, MD;  [facility];  Service:  Urology;  Laterality: Bilateral;    There were no vitals filed for this visit.  Subjective Assessment - 09/22/18 1443    Subjective  Pt reporting he is very sore today after having spent the day yesterday standing in the yard supervising people doing his yardwork.    Pertinent History  Parkinson's x 5 yrs    Patient Stated Goals  "be able to join [employer] boxing class (long term); be able to take care of more things around the house"    Currently in Pain?  Yes    Pain Score  4    & legs   Pain Location  Back    Pain Orientation  Lower    Pain Descriptors / Indicators  Sore    Pain Type  Chronic pain                       [facility] Adult PT Treatment/Exercise - 09/22/18 1440      Bed Mobility   Bed Mobility  Rolling Right;Rolling Left;Right Sidelying to Sit;Supine to Sit;Sit to Supine;Sitting - Scoot to Edge of Bed    Rolling Right  Supervision/verbal cueing    Rolling Left  Supervision/Verbal cueing    Right Sidelying to Sit  Moderate Assistance -  Patient 50-74%;Supervision/Verbal cueing    Supine to Sit  Moderate Assistance - Patient 50-74%;Supervision/Verbal cueing    Supine to Sit Details (indicate cue type and reason)  cues for sequencing of movement with assist for transitioning knee/loweres legs over edge of bed as well as initiation of lifting shoulders from sidelying on mat    Sitting - Scoot to Edge of Bed  Supervision/Verbal cueing    Sit to Supine  Minimal Assistance - Patient > 75%;Supervision/Verbal cueing    Sit to Supine - Details (indicate cue type and reason)  assist to guide shoulders and reposition on pillows      Exercises   Exercises  Knee/Hip      Lumbar Exercises: Supine   Ab Set  10 reps;5 seconds    Clam  10 reps;3 seconds    Clam Limitations  bent knee fall-out    Bent Knee Raise  10 reps;2 seconds    Bent Knee Raise Limitations  brace marching    Basic Lumbar Stabilization  10 reps;5 seconds    Basic Lumbar Stabilization Limitations   + hip adduction ball squeeze    Isometric Hip Flexion  10 reps;3 seconds    Isometric Hip Flexion Limitations  alt LE      Knee/Hip Exercises: Aerobic   Nustep  L3 x 6 min - UE/LE to promote reciprocal motion        [facility]) - 09/22/18 1440    PWR! exercises  Moves in supine    PWR! Up  x10    PWR! Rock  x10    PWR! Twist  x10    PWR! Step  x10          PT Education - 09/22/18 1529    Education Details  Supine PWR! Moves; bed mobility utilizing PWR! Moves    Person(s) Educated  Patient    Methods  Explanation;Demonstration;Handout    Comprehension  Verbalized understanding;Returned demonstration;Need further instruction       PT Short Term Goals - 09/12/18 1357      PT SHORT TERM GOAL #1   Title  Independent with initial HEP to address core/ LE flexibility and strengthening    Status  On-going    Target Date  09/27/18        PT Long Term Goals - 09/12/18 1357      PT LONG TERM GOAL #1   Title  Independent with ongoing HEP incorporating PWR! Moves as indicated    Status  On-going    Target Date  11/01/18      PT LONG TERM GOAL #2   Title  B LE strength >/= 4/5 to 4+/5 for improved stability    Status  On-going    Target Date  11/01/18      PT LONG TERM GOAL #3   Title  Patient to score <14 sec on TUG testing with LRAD to decrease risk of falls.    Status  On-going    Target Date  11/01/18      PT LONG TERM GOAL #4   Title  Patient to score </=16 sec on 5x STS without UEs to decrease risk of falls.    Status  On-going    Target Date  11/01/18      PT LONG TERM GOAL #5   Title  Patient will demonstrate increase in gait speed to >/= 2.62 ft/sec with LRAD to promote increased safety with community ambulation    Status  On-going    Target  Date  11/01/18      PT LONG TERM GOAL #6   Title  Patient will verbalize understanding of online or community based exercise programs geared toward Parkinson's disease to promote ongoing wellness    Status  On-going     Target Date  11/01/18            Plan - 09/22/18 1447    Clinical Impression Statement  Bill Guerrero expressing difficulty with transitioning in/out bed as well as repositioning in the bed, therefore introduced Supine PWR! Moves and educated patient in carryover to bed mobility using PWR! Rock & Twist to help with rolling and PWR! Step to help with repositioning. Also introduced core/lumbopelvic strengthening in supine to address ongoing back pain and assist with ease of bed mobility. Patient reporting he typically sleeps on 4 pillows at home - encouraged patient to try to wean pillows one at a time over the course of a few days to promote improved posture and decrease forward head/shoulders.    Comorbidities  chronic LBP; s/p cervical & lumbar laminectomies; stage 4 metastatic melanoma - currently in remission; L THR; R RTC repair; bipolar; anxiety; depression    PT Frequency  2x / week    PT Duration  8 weeks    PT Treatment/Interventions  Patient/family education;Neuromuscular re-education;Therapeutic exercise;Therapeutic activities;Functional mobility training;Gait training;Stair training;Balance training;Manual techniques;Dry needling;Taping;Electrical Stimulation;Moist Heat;ADLs/Self Care Home Management    PT Next Visit Plan  Review supine & seated PWR! Moves PRN, bed mobility; LE stretching & core/LE strengthening HEP; Gait training with rollator    Consulted and Agree with Plan of Care  Patient       Patient will benefit from skilled therapeutic intervention in order to improve the following deficits and impairments:  Decreased balance, Abnormal gait, Difficulty walking, Decreased mobility, Decreased strength, Decreased activity tolerance, Decreased safety awareness, Decreased knowledge of use of DME, Postural dysfunction, Improper body mechanics, Pain, Dizziness  Visit Diagnosis: Unsteadiness on feet  Difficulty in walking, not elsewhere classified  Muscle weakness  (generalized)  Repeated falls  Chronic bilateral low back pain without sciatica     Problem List Patient Active Problem List   Diagnosis Date Noted  . Chronic midline low back pain without sciatica 12/06/2017  . Lumbar post-laminectomy syndrome 12/06/2017  . Cervical post-laminectomy syndrome 12/06/2017  . Parkinson's disease ([place]) 12/06/2017  . 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 Hyperlipemia   . Bipolar 1 disorder ([place])     Percival Spanish, PT, MPT 09/22/2018, 5:01 PM  [facility] [address]  [place], [place], [address] Phone: [phone]   Fax:  [phone]  Name: Bill Guerrero MRN: [id number] Date of Birth: 02/13/1950

## 2018-09-28 ENCOUNTER — Ambulatory Visit: Payer: Medicare Other | Admitting: Physical Therapy

## 2018-09-28 ENCOUNTER — Other Ambulatory Visit: Payer: Self-pay

## 2018-09-28 ENCOUNTER — Encounter: Payer: Self-pay | Admitting: Physical Therapy

## 2018-09-28 VITALS — BP 96/56 | HR 74

## 2018-09-28 DIAGNOSIS — M6281 Muscle weakness (generalized): Secondary | ICD-10-CM

## 2018-09-28 DIAGNOSIS — R296 Repeated falls: Secondary | ICD-10-CM | POA: Diagnosis not present

## 2018-09-28 DIAGNOSIS — G8929 Other chronic pain: Secondary | ICD-10-CM

## 2018-09-28 DIAGNOSIS — M545 Low back pain, unspecified: Secondary | ICD-10-CM

## 2018-09-28 DIAGNOSIS — R2681 Unsteadiness on feet: Secondary | ICD-10-CM | POA: Diagnosis not present

## 2018-09-28 DIAGNOSIS — R262 Difficulty in walking, not elsewhere classified: Secondary | ICD-10-CM | POA: Diagnosis not present

## 2018-09-28 NOTE — Therapy (Signed)
[facility] [address]  [place], [place], [address] Phone: [phone]   Fax:  [phone]  Physical Therapy Treatment  Patient Details  Name: Bill Guerrero MRN: [id number] Date of Birth: [birth year] Referring Provider (PT): Alonza Bogus, MD   Encounter Date: 09/28/2018  PT End of Session - 09/28/18 1153    Visit Number  6    Number of Visits  16    Date for PT Re-Evaluation  11/01/18    Authorization Type  Medicare & AARP    PT Start Time  [id number]    PT Stop Time  1255    PT Time Calculation (min)  62 min    Activity Tolerance  Patient tolerated treatment well    Behavior During Therapy  [facility] for tasks assessed/performed       Past Medical History:  Diagnosis Date  . Arthritis    hips replacement  . Bipolar 1 disorder ([place])   . Hyperlipemia   . Hypertension   . Infection of prosthesis ([place])    penile implant with abscess with drainage of scrotum -"pinkish tan""no odor"  . Multiple body piercings   . Tremor    RT HAND    Past Surgical History:  Procedure Laterality Date  . BACK SURGERY     CERVICAL AND LUMBAR  . GROIN MASS OPEN BIOPSY     MASS REMOVED FROM GROIN  . HEMORROIDECTOMY    . HERNIA REPAIR     BIL IN HERNIA  . JOINT REPLACEMENT     TOTAL LEFT HIP  . PENILE PROSTHESIS IMPLANT N/A 06/17/2012   Procedure: THREE PIECE PENILE PROTHESIS INFLATABLE-COLOPLAST (PENILE SCROTAL APPROACH);  Surgeon: Fredricka Bonine, MD;  [facility];  Service: Urology;  Laterality: N/A;  . PENILE PROSTHESIS IMPLANT N/A 10/07/2012   Procedure: PENILE PROTHESIS REMOVAL AND REPLACEMENT INFLATABLE, PENILE SCROTAL APPROACH;  Surgeon: Fredricka Bonine, MD;  [facility];  Service: Urology;  Laterality: N/A;  . PILONIDAL CYST EXCISION  1986  . ROTATOR CUFF REPAIR     RIGHT  . VASECTOMY Bilateral 06/17/2012   Procedure: VASECTOMY;  Surgeon: Fredricka Bonine, MD;  [facility];  Service:  Urology;  Laterality: Bilateral;    Vitals:   09/28/18 1158 09/28/18 1240 09/28/18 1244 09/28/18 1252  BP: (!) 104/58 (!) 100/56 (!) 88/44 (!) 96/56  Pulse: 72 74    SpO2: 96% 96%      Subjective Assessment - 09/28/18 1211    Subjective  Pt reporting not having a good day today. He reports increasing incidence of "near blackout episodes" after short walks. Also noting increased neck and upper shoulder pain.    Pertinent History  Parkinson's x 5 yrs    Patient Stated Goals  "be able to join [employer] boxing class (long term); be able to take care of more things around the house"    Currently in Pain?  Yes    Pain Score  0-No pain    Pain Location  Back    Pain Orientation  Lower    Pain Descriptors / Indicators  Tightness    Pain Score  3   up to 8-10/10 at worst   Pain Location  Neck   and upper shoulders   Pain Orientation  Lower    Pain Type  Acute pain;Chronic pain    Pain Radiating Towards  upper shoulders only    Pain Onset  More than a month ago   ~  1 yr   Pain Frequency  Intermittent    Aggravating Factors   uncertain, but if he wakes up in pain it will stay all day                       [facility] Adult PT Treatment/Exercise - 09/28/18 1153      Neck Exercises: Seated   Neck Retraction  10 reps;5 secs    Shoulder Rolls  Backwards;10 reps    Other Seated Exercise  B scap retraction/rows with red TB 10 x 5"    Other Seated Exercise  B scap retraction + shoulder horiz abduction & diagonals x 5 reps each - deferred d/t increased soreness in upper shoulders      Knee/Hip Exercises: Aerobic   Nustep  L4 x 6 min - UE/LE to promote reciprocal motion      Neck Exercises: Stretches   Upper Trapezius Stretch  Right;Left;30 seconds;2 reps    Levator Stretch  Right;Left;30 seconds;2 reps             PT Education - 09/28/18 1240    Education Details  Orthostatic BP findings and recommendation to f/u with PCP; HEP update - neck stretches and postural  exercises    Person(s) Educated  Patient    Methods  Explanation;Demonstration;Handout    Comprehension  Verbalized understanding;Returned demonstration;Need further instruction       PT Short Term Goals - 09/12/18 1357      PT SHORT TERM GOAL #1   Title  Independent with initial HEP to address core/ LE flexibility and strengthening    Status  On-going    Target Date  09/27/18        PT Long Term Goals - 09/12/18 1357      PT LONG TERM GOAL #1   Title  Independent with ongoing HEP incorporating PWR! Moves as indicated    Status  On-going    Target Date  11/01/18      PT LONG TERM GOAL #2   Title  B LE strength >/= 4/5 to 4+/5 for improved stability    Status  On-going    Target Date  11/01/18      PT LONG TERM GOAL #3   Title  Patient to score <14 sec on TUG testing with LRAD to decrease risk of falls.    Status  On-going    Target Date  11/01/18      PT LONG TERM GOAL #4   Title  Patient to score </=16 sec on 5x STS without UEs to decrease risk of falls.    Status  On-going    Target Date  11/01/18      PT LONG TERM GOAL #5   Title  Patient will demonstrate increase in gait speed to >/= 2.62 ft/sec with LRAD to promote increased safety with community ambulation    Status  On-going    Target Date  11/01/18      PT LONG TERM GOAL #6   Title  Patient will verbalize understanding of online or community based exercise programs geared toward Parkinson's disease to promote ongoing wellness    Status  On-going    Target Date  11/01/18            Plan - 09/28/18 1255    Clinical Impression Statement  [place] reporting increasing incidence of near "blackout" episodes over past few days along with increased neck and upper shoulder pain. Previous orthostatic BP assessment had not  indicated an orthostatic drop but overall BPs lower today with some decrease in BP from sit to standing, potentially significant enough to trigger a near "blackout" feeling, therefore recorded  BPs for patient and encouraged him to f/u with his PCP regarding symptoms and BP findings during today's session. Addressed neck and upper shoulder pain with stretches and postural exercises to reduce muscle tension in UT and LS with HEP updated accordingly.    Comorbidities  chronic LBP; s/p cervical & lumbar laminectomies; stage 4 metastatic melanoma - currently in remission; L THR; R RTC repair; bipolar; anxiety; depression    PT Frequency  2x / week    PT Duration  8 weeks    PT Treatment/Interventions  Patient/family education;Neuromuscular re-education;Therapeutic exercise;Therapeutic activities;Functional mobility training;Gait training;Stair training;Balance training;Manual techniques;Dry needling;Taping;Electrical Stimulation;Moist Heat;ADLs/Self Care Home Management    PT Next Visit Plan  Assess VS including potential orthostatic BP as indicated; Review neck stretches & postural exercises; Review supine & seated PWR! Moves PRN, bed mobility; LE stretching & core/LE strengthening HEP; Gait training with rollator    Consulted and Agree with Plan of Care  Patient       Patient will benefit from skilled therapeutic intervention in order to improve the following deficits and impairments:  Decreased balance, Abnormal gait, Difficulty walking, Decreased mobility, Decreased strength, Decreased activity tolerance, Decreased safety awareness, Decreased knowledge of use of DME, Postural dysfunction, Improper body mechanics, Pain, Dizziness  Visit Diagnosis: Unsteadiness on feet  Difficulty in walking, not elsewhere classified  Muscle weakness (generalized)  Repeated falls  Chronic bilateral low back pain without sciatica     Problem List Patient Active Problem List   Diagnosis Date Noted  . Chronic midline low back pain without sciatica 12/06/2017  . Lumbar post-laminectomy syndrome 12/06/2017  . Cervical post-laminectomy syndrome 12/06/2017  . Parkinson's disease ([place]) 12/06/2017  .  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 Hyperlipemia   . Bipolar 1 disorder ([place])     Percival Spanish, PT, MPT 09/28/2018, 2:01 PM  [facility] [address]  [place], [place], [address] Phone: [phone]   Fax:  [phone]  Name: JAMIE BALLIETT MRN: [id number] Date of Birth: [birth year]

## 2018-10-03 ENCOUNTER — Ambulatory Visit: Payer: Medicare Other | Admitting: Physical Therapy

## 2018-10-03 ENCOUNTER — Encounter: Payer: Self-pay | Admitting: Physical Therapy

## 2018-10-03 ENCOUNTER — Other Ambulatory Visit: Payer: Self-pay

## 2018-10-03 VITALS — BP 122/68 | HR 82

## 2018-10-03 DIAGNOSIS — G8929 Other chronic pain: Secondary | ICD-10-CM | POA: Diagnosis not present

## 2018-10-03 DIAGNOSIS — R2681 Unsteadiness on feet: Secondary | ICD-10-CM

## 2018-10-03 DIAGNOSIS — M545 Low back pain, unspecified: Secondary | ICD-10-CM

## 2018-10-03 DIAGNOSIS — R262 Difficulty in walking, not elsewhere classified: Secondary | ICD-10-CM | POA: Diagnosis not present

## 2018-10-03 DIAGNOSIS — M6281 Muscle weakness (generalized): Secondary | ICD-10-CM

## 2018-10-03 DIAGNOSIS — R296 Repeated falls: Secondary | ICD-10-CM | POA: Diagnosis not present

## 2018-10-03 NOTE — Therapy (Signed)
[facility] [address]  [place], [place], [address] Phone: [phone]   Fax:  [phone]  Physical Therapy Treatment  Patient Details  Name: Bill Guerrero MRN: [id number] Date of Birth: [birth year] Referring Provider (PT): Alonza Bogus, MD   Encounter Date: 10/03/2018  PT End of Session - 10/03/18 1315    Visit Number  7    Number of Visits  16    Date for PT Re-Evaluation  11/01/18    Authorization Type  Medicare & AARP    PT Start Time  [id number]    PT Stop Time  1402    PT Time Calculation (min)  47 min    Activity Tolerance  Patient tolerated treatment well    Behavior During Therapy  [facility] for tasks assessed/performed       Past Medical History:  Diagnosis Date  . Arthritis    hips replacement  . Bipolar 1 disorder ([place])   . Hyperlipemia   . Hypertension   . 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 Multiple body piercings   . Tremor    RT HAND    Past Surgical History:  Procedure Laterality Date  . BACK SURGERY     CERVICAL AND LUMBAR  . GROIN MASS OPEN BIOPSY     MASS REMOVED FROM GROIN  . HEMORROIDECTOMY    . HERNIA REPAIR     BIL IN HERNIA  . JOINT REPLACEMENT     TOTAL LEFT HIP  . PENILE PROSTHESIS IMPLANT N/A 06/17/2012   Procedure: THREE PIECE PENILE PROTHESIS INFLATABLE-COLOPLAST (PENILE SCROTAL APPROACH);  Surgeon: Fredricka Bonine, MD;  [facility];  Service: Urology;  Laterality: N/A;  . PENILE PROSTHESIS IMPLANT N/A 10/07/2012   Procedure: PENILE PROTHESIS REMOVAL AND REPLACEMENT INFLATABLE, PENILE SCROTAL APPROACH;  Surgeon: Fredricka Bonine, MD;  [facility];  Service: Urology;  Laterality: N/A;  . PILONIDAL CYST EXCISION  1986  . ROTATOR CUFF REPAIR     RIGHT  . VASECTOMY Bilateral 06/17/2012   Procedure: VASECTOMY;  Surgeon: Fredricka Bonine, MD;  [facility];  Service:  Urology;  Laterality: Bilateral;    Vitals:   10/03/18 1318 10/03/18 1358  BP: (!) 142/74 122/68  Pulse: 80 82  SpO2: 98% 96%    Subjective Assessment - 10/03/18 1318    Subjective  Pt reporting he has misplaced most of his HEP handouts with the exception of the most recent handout. States his back is sore today after doing a lot of lifting yesterday. He reports he called his PCP upon leaving PT last vist re: the "dizzy"/"near"blackout" episodes - states MD told him to stop taking BP meds for 24 hrs and then resume 1/2 dose for next 7 days then f/u with MD (states he stopped BP meds on Friday, but has not resume 1/2 does yet). BP at home has been WNL and no further "dizzy" episodes.    Pertinent History  Parkinson's x 5 yrs    Patient Stated Goals  "be able to join [employer] boxing class (long term); be able to take care of more things around the house"    Pain Score  3     Pain Location  Back    Pain Orientation  Lower    Pain Descriptors / Indicators  Sore    Pain Type  Chronic pain    Pain Onset  More than a month ago   ~1 yr                      [facility] Adult PT Treatment/Exercise - 10/03/18 1315      Neck Exercises: Seated   Neck Retraction  10 reps;5 secs    Shoulder Rolls  Backwards;10 reps    Other Seated Exercise  B scap retraction/rows with red TB 10 x 5"    Other Seated Exercise  Cervical extension SNAGs with pillowcase x 10      Knee/Hip Exercises: Aerobic   Nustep  L6 x 6 min - UE/LE to promote reciprocal motion      Neck Exercises: Stretches   Upper Trapezius Stretch  Right;Left;30 seconds;1 rep    Levator Stretch  Right;Left;30 seconds;1 rep        PWR ([facility]) - 10/03/18 1315    PWR! exercises  Moves in standing    PWR! Up  x10    PWR! Rock  x10    PWR! Twist  x10    PWR Step  x10            PT Short Term Goals - 09/12/18 1357      PT SHORT TERM GOAL #1   Title  Independent with initial HEP to address core/ LE flexibility and  strengthening    Status  On-going    Target Date  09/27/18        PT Long Term Goals - 09/12/18 1357      PT LONG TERM GOAL #1   Title  Independent with ongoing HEP incorporating PWR! Moves as indicated    Status  On-going    Target Date  11/01/18      PT LONG TERM GOAL #2   Title  B LE strength >/= 4/5 to 4+/5 for improved stability    Status  On-going    Target Date  11/01/18      PT LONG TERM GOAL #3   Title  Patient to score <14 sec on TUG testing with LRAD to decrease risk of falls.    Status  On-going    Target Date  11/01/18      PT LONG TERM GOAL #4   Title  Patient to score </=16 sec on 5x STS without UEs to decrease risk of falls.    Status  On-going    Target Date  11/01/18      PT LONG TERM GOAL #5   Title  Patient will demonstrate increase in gait speed to >/= 2.62 ft/sec with LRAD to promote increased safety with community ambulation    Status  On-going    Target Date  11/01/18      PT LONG TERM GOAL #6   Title  Patient will verbalize understanding of online or community based exercise programs geared toward Parkinson's disease to promote ongoing wellness    Status  On-going    Target Date  11/01/18            Plan - 10/03/18 1402    Clinical Impression Statement  Bill Guerrero reporting no further "dizzy"/near "blackout" episodes since stopping BP meds as instructed by MD but has yet to resume  dose with BP meds as instructed. BP low hypertensive after warm-up but WNL by end of session. Reviewed neck stretches and exercises with good return demonstration, however patient reporting he had misplaced prior HEP handouts, therefore reprinted these. Introduced standing PWR! Moves with patient limited by fatigue and  noting some increased neck discomfort following this, therefore guided patient in stretches and cervical extensions to relieve increased tension. No updates to HEP issued today given increased neck discomfort.    Comorbidities  chronic LBP; s/p cervical &  lumbar laminectomies; stage 4 metastatic melanoma - currently in remission; L THR; R RTC repair; bipolar; anxiety; depression    Rehab Potential  Good    PT Frequency  2x / week    PT Duration  8 weeks    PT Treatment/Interventions  Patient/family education;Neuromuscular re-education;Therapeutic exercise;Therapeutic activities;Functional mobility training;Gait training;Stair training;Balance training;Manual techniques;Dry needling;Taping;Electrical Stimulation;Moist Heat;ADLs/Self Care Home Management    PT Next Visit Plan  Assess VS including potential orthostatic BP as indicated; Review neck stretches & postural exercises; Review supine & seated PWR! Moves PRN, bed mobility; LE stretching & core/LE strengthening HEP; Gait training with rollator    Consulted and Agree with Plan of Care  Patient       Patient will benefit from skilled therapeutic intervention in order to improve the following deficits and impairments:  Decreased balance, Abnormal gait, Difficulty walking, Decreased mobility, Decreased strength, Decreased activity tolerance, Decreased safety awareness, Decreased knowledge of use of DME, Postural dysfunction, Improper body mechanics, Pain, Dizziness  Visit Diagnosis: Unsteadiness on feet  Difficulty in walking, not elsewhere classified  Muscle weakness (generalized)  Repeated falls  Chronic bilateral low back pain without sciatica     Problem List Patient Active Problem List   Diagnosis Date Noted  . Chronic midline low back pain without sciatica 12/06/2017  . Lumbar post-laminectomy syndrome 12/06/2017  . Cervical post-laminectomy syndrome 12/06/2017  . Parkinson's disease ([place]) 12/06/2017  . 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 Hyperlipemia   . Bipolar 1 disorder ([employer])     Bill Guerrero, PT, MPT 10/03/2018, 3:50 PM  [facility] [address]  [place], [place], [address] Phone:  [phone]   Fax:  [phone]  Name: Bill Guerrero MRN: [id number] Date of Birth: 07-28-1950

## 2018-10-05 ENCOUNTER — Encounter: Payer: Self-pay | Admitting: Physical Therapy

## 2018-10-05 ENCOUNTER — Ambulatory Visit: Payer: Medicare Other | Attending: Neurology | Admitting: Physical Therapy

## 2018-10-05 ENCOUNTER — Other Ambulatory Visit: Payer: Self-pay

## 2018-10-05 VITALS — BP 142/76 | HR 75

## 2018-10-05 DIAGNOSIS — M6281 Muscle weakness (generalized): Secondary | ICD-10-CM | POA: Diagnosis not present

## 2018-10-05 DIAGNOSIS — M545 Low back pain, unspecified: Secondary | ICD-10-CM

## 2018-10-05 DIAGNOSIS — G8929 Other chronic pain: Secondary | ICD-10-CM | POA: Insufficient documentation

## 2018-10-05 DIAGNOSIS — R296 Repeated falls: Secondary | ICD-10-CM | POA: Diagnosis not present

## 2018-10-05 DIAGNOSIS — R2681 Unsteadiness on feet: Secondary | ICD-10-CM

## 2018-10-05 DIAGNOSIS — R262 Difficulty in walking, not elsewhere classified: Secondary | ICD-10-CM | POA: Insufficient documentation

## 2018-10-05 NOTE — Therapy (Addendum)
[facility] [address]  [place], [place], [address] Phone: [phone]   Fax:  [phone]  Physical Therapy Treatment  Patient Details  Name: Bill Guerrero MRN: [id number] Date of Birth: October 22, 1950 Referring Provider (PT): Alonza Bogus, MD   Encounter Date: 10/05/2018  PT End of Session - 10/05/18 1145    Visit Number  8    Number of Visits  16    Date for PT Re-Evaluation  11/01/18    Authorization Type  Medicare & AARP    PT Start Time  [id number]    PT Stop Time  1232    PT Time Calculation (min)  47 min    Activity Tolerance  Patient tolerated treatment well    Behavior During Therapy  [facility] for tasks assessed/performed       Past Medical History:  Diagnosis Date  . Arthritis    hips replacement  . Bipolar 1 disorder ([place])   . Hyperlipemia   . Hypertension   . 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 Multiple body piercings   . Tremor    RT HAND    Past Surgical History:  Procedure Laterality Date  . BACK SURGERY     CERVICAL AND LUMBAR  . GROIN MASS OPEN BIOPSY     MASS REMOVED FROM GROIN  . HEMORROIDECTOMY    . HERNIA REPAIR     BIL IN HERNIA  . JOINT REPLACEMENT     TOTAL LEFT HIP  . PENILE PROSTHESIS IMPLANT N/A 06/17/2012   Procedure: THREE PIECE PENILE PROTHESIS INFLATABLE-COLOPLAST (PENILE SCROTAL APPROACH);  Surgeon: Fredricka Bonine, MD;  Location: [facility];  Service: Urology;  Laterality: N/A;  . PENILE PROSTHESIS IMPLANT N/A 10/07/2012   Procedure: PENILE PROTHESIS REMOVAL AND REPLACEMENT INFLATABLE, PENILE SCROTAL APPROACH;  Surgeon: Fredricka Bonine, MD;  Location: [facility];  Service: Urology;  Laterality: N/A;  . PILONIDAL CYST EXCISION  1986  . ROTATOR CUFF REPAIR     RIGHT  . VASECTOMY Bilateral 06/17/2012   Procedure: VASECTOMY;  Surgeon: Fredricka Bonine, MD;  Location: [facility];  Service:  Urology;  Laterality: Bilateral;    Vitals:   10/05/18 1157 10/05/18 1227  BP: (!) 146/78 (!) 142/76  Pulse: 75 75  SpO2: 97% 97%    Subjective Assessment - 10/05/18 1150    Subjective  Pt reporting his legs are more tired today after doing more around the house yesterday. Also states he started back on the 1/2 dose of his BP meds but started having dizzy epiosdes again so did not take BP meds today.    Pertinent History  Parkinson's x 5 yrs    Patient Stated Goals  "be able to join [employer] class (long term); be able to take care of more things around the house"    Currently in Pain?  Yes    Pain Score  2     Pain Location  Back    Pain Descriptors / Indicators  Sore    Pain Score  3    Pain Location  Shoulder    Pain Orientation  Left;Right   L>R   Pain Descriptors / Indicators  Burning    Pain Onset  More than a month ago   ~1 yr  [place] Adult PT Treatment/Exercise - 10/05/18 1145      Knee/Hip Exercises: Aerobic   Nustep  L6 x 5 min, L4 x 1 min - UE/LE to promote reciprocal motion      Knee/Hip Exercises: Standing   Heel Raises  Both;10 reps;3 seconds    Hip Flexion  Right;Left;5 reps;2 sets;Knee bent    Hip Flexion Limitations  looped yellow TB at ankles    Hip Abduction  Right;Left;10 reps;Knee straight;Stengthening    Abduction Limitations  looped yellow TB at ankles    Hip Extension  Right;Left;10 reps;Knee straight;Stengthening    Extension Limitations  looped yellow TB at ankles    Functional Squat  10 reps;3 seconds    Functional Squat Limitations  counter mini-squat    Other Standing Knee Exercises  B side stepping aliong counter 4 x 10 ft        [facility]) - 10/05/18 1145    PWR! exercises  Functional moves    PWR! Step Through Forward/Back  alternating PWR! Step forward & back to neutral 2 x 10; 2nd set with step-over 1/2 FR to promote increased foot clearance            PT Short Term Goals - 09/12/18  1357      PT SHORT TERM GOAL #1   Title  Independent with initial HEP to address core/ LE flexibility and strengthening    Status  On-going    Target Date  09/27/18        PT Long Term Goals - 09/12/18 1357      PT LONG TERM GOAL #1   Title  Independent with ongoing HEP incorporating PWR! Moves as indicated    Status  On-going    Target Date  11/01/18      PT LONG TERM GOAL #2   Title  B LE strength >/= 4/5 to 4+/5 for improved stability    Status  On-going    Target Date  11/01/18      PT LONG TERM GOAL #3   Title  Patient to score <14 sec on TUG testing with LRAD to decrease risk of falls.    Status  On-going    Target Date  11/01/18      PT LONG TERM GOAL #4   Title  Patient to score </=16 sec on 5x STS without UEs to decrease risk of falls.    Status  On-going    Target Date  11/01/18      PT LONG TERM GOAL #5   Title  Patient will demonstrate increase in gait speed to >/= 2.62 ft/sec with LRAD to promote increased safety with community ambulation    Status  On-going    Target Date  11/01/18      PT LONG TERM GOAL #6   Title  Patient will verbalize understanding of online or community based exercise programs geared toward Parkinson's disease to promote ongoing wellness    Status  On-going    Target Date  11/01/18            Plan - 10/05/18 1232    Clinical Impression Statement  [name] reporting he tried starting back on  dose of BP meds yesterday as instructed by MD but started having dizzy spells again so he did not take BP meds today and has not been in further contact with MD. He states BPs at home have been normal but BP low hypertensive after warm-up and again at end of PT session.  Encouraged patient to make sure to follow up with MD regarding ongoing symptoms when taking BP meds as well as BP elevation with exercise/activity when not taking meds. [place] requesting to avoid activities today requiring shoulder motions as L shoulder bothering him today but  did not feel need for PT to address pain, therefore focused session on LE strengthening and PWR! Walk/stepping activities. Patient fatiguing quickly requiring intermittent seated rest breaks but otherwise tolerated therapeutic activities well.    Comorbidities  chronic LBP; s/p cervical & lumbar laminectomies; stage 4 metastatic melanoma - currently in remission; L THR; R RTC repair; bipolar; anxiety; depression    Rehab Potential  Good    PT Frequency  2x / week    PT Duration  8 weeks    PT Treatment/Interventions  Patient/family education;Neuromuscular re-education;Therapeutic exercise;Therapeutic activities;Functional mobility training;Gait training;Stair training;Balance training;Manual techniques;Dry needling;Taping;Electrical Stimulation;Moist Heat;ADLs/Self Care Home Management    PT Next Visit Plan  Assess VS including potential orthostatic BP as indicated; Review neck stretches & postural exercises; Review supine, seated & standing PWR! Moves PRN; bed mobility; LE stretching & core/LE strengthening; Gait training with rollator    PT Home Exercise Plan  Seated PWR! Moves; seated LE stretching & core/LE strengthening; neck stretches & postural exercises    Consulted and Agree with Plan of Care  Patient       Patient will benefit from skilled therapeutic intervention in order to improve the following deficits and impairments:  Decreased balance, Abnormal gait, Difficulty walking, Decreased mobility, Decreased strength, Decreased activity tolerance, Decreased safety awareness, Decreased knowledge of use of DME, Postural dysfunction, Improper body mechanics, Pain, Dizziness  Visit Diagnosis: Unsteadiness on feet  Difficulty in walking, not elsewhere classified  Muscle weakness (generalized)  Repeated falls  Chronic bilateral low back pain without sciatica     Problem List Patient Active Problem List   Diagnosis Date Noted  . Chronic midline low back pain without sciatica  12/06/2017  . Lumbar post-laminectomy syndrome 12/06/2017  . Cervical post-laminectomy syndrome 12/06/2017  . Parkinson's disease ([place]) 12/06/2017  . 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 Hyperlipemia   . Bipolar 1 disorder ([place])     Percival Spanish, PT, MPT 10/05/2018, 1:09 PM  [facility] [address]  [place], [place], [address] Phone: [phone]   Fax:  [phone]  Name: IRMA SPOMER MRN: [id number] Date of Birth: 11/22/1950

## 2018-10-11 ENCOUNTER — Other Ambulatory Visit: Payer: Self-pay

## 2018-10-11 ENCOUNTER — Encounter: Payer: Self-pay | Admitting: Physical Therapy

## 2018-10-11 ENCOUNTER — Ambulatory Visit: Payer: Medicare Other | Admitting: Physical Therapy

## 2018-10-11 VITALS — BP 118/70 | HR 91

## 2018-10-11 DIAGNOSIS — R2681 Unsteadiness on feet: Secondary | ICD-10-CM | POA: Diagnosis not present

## 2018-10-11 DIAGNOSIS — G8929 Other chronic pain: Secondary | ICD-10-CM

## 2018-10-11 DIAGNOSIS — M545 Low back pain, unspecified: Secondary | ICD-10-CM

## 2018-10-11 DIAGNOSIS — R296 Repeated falls: Secondary | ICD-10-CM | POA: Diagnosis not present

## 2018-10-11 DIAGNOSIS — M6281 Muscle weakness (generalized): Secondary | ICD-10-CM | POA: Diagnosis not present

## 2018-10-11 DIAGNOSIS — R262 Difficulty in walking, not elsewhere classified: Secondary | ICD-10-CM

## 2018-10-11 NOTE — Therapy (Signed)
[facility] [address]  [place], [place], [address] Phone: [phone]   Fax:  [phone]  Physical Therapy Treatment  Patient Details  Name: Bill Guerrero MRN: [id number] Date of Birth: 25-Nov-1950 Referring Provider (PT): Alonza Bogus, MD   Encounter Date: 10/11/2018  PT End of Session - 10/11/18 1310    Visit Number  9    Number of Visits  16    Date for PT Re-Evaluation  11/01/18    Authorization Type  Medicare & AARP    PT Start Time  1310    PT Stop Time  1359    PT Time Calculation (min)  49 min    Activity Tolerance  Patient tolerated treatment well    Behavior During Therapy  [facility] for tasks assessed/performed       Past Medical History:  Diagnosis Date  . Arthritis    hips replacement  . Bipolar 1 disorder ([place])   . Hyperlipemia   . Hypertension   . 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 Multiple body piercings   . Tremor    RT HAND    Past Surgical History:  Procedure Laterality Date  . BACK SURGERY     CERVICAL AND LUMBAR  . GROIN MASS OPEN BIOPSY     MASS REMOVED FROM GROIN  . HEMORROIDECTOMY    . HERNIA REPAIR     BIL IN HERNIA  . JOINT REPLACEMENT     TOTAL LEFT HIP  . PENILE PROSTHESIS IMPLANT N/A 06/17/2012   Procedure: THREE PIECE PENILE PROTHESIS INFLATABLE-COLOPLAST (PENILE SCROTAL APPROACH);  Surgeon: Fredricka Bonine, MD;  [facility];  Service: Urology;  Laterality: N/A;  . PENILE PROSTHESIS IMPLANT N/A 10/07/2012   Procedure: PENILE PROTHESIS REMOVAL AND REPLACEMENT INFLATABLE, PENILE SCROTAL APPROACH;  Surgeon: Fredricka Bonine, MD;  Location: [facility];  Service: Urology;  Laterality: N/A;  . PILONIDAL CYST EXCISION  1986  . ROTATOR CUFF REPAIR     RIGHT  . VASECTOMY Bilateral 06/17/2012   Procedure: VASECTOMY;  Surgeon: Fredricka Bonine, MD;  Location: [facility];  Service:  Urology;  Laterality: Bilateral;    Vitals:   10/11/18 1311 10/11/18 1317 10/11/18 1358  BP: 140/78 128/64 118/70  Pulse: 80 91   SpO2: 94% 96%     Subjective Assessment - 10/11/18 1317    Subjective  Pt reporting he started the 1/2 dose of his BP meds on Friday and his BP has been fluctuating over the weekend but no dizzy epsiodes. States his back is sore after doing more lfting than usual over the weeekend.    Pertinent History  Parkinson's x 5 yrs    Patient Stated Goals  "be able to join [employer] boxing class (long term); be able to take care of more things around the house"    Pain Score  2    2-3/10   Pain Location  Back    Pain Orientation  Lower    Pain Descriptors / Indicators  Sore    Pain Onset  More than a month ago   ~1 yr                      [facility] Adult PT Treatment/Exercise - 10/11/18 1310      Neck Exercises: Seated   Neck Retraction  10 reps;5 secs      Knee/Hip  Exercises: Aerobic   Nustep  L5 x 6 min - UE/LE to promote reciprocal motion      Manual Therapy   Manual Therapy  Soft tissue mobilization;Myofascial release    Manual therapy comments  seated    Soft tissue mobilization  STM to L UT/LS    Myofascial Release  manual TPR to L UT & LS      Neck Exercises: Stretches   Upper Trapezius Stretch  Right;Left;30 seconds;1 rep    Levator Stretch  Right;Left;30 seconds;1 rep        PWR ([facility]) - 10/11/18 1310    PWR! exercises  Moves in standing;Functional moves    PWR! Up  x10    PWR! Rock  x10    PWR! Twist  x10   modified with posterior twist maintaining 1 hand on chair   PWR Step  x10    Comments  modified with chair in front for UE support    PWR! Step Through Forward/Back  alternating PWR! Step forward & back to neutral 2 x 10; alternating PWR! Step back with B UE support on back of chairs x 10, 2nd set wiith single UE support            PT Short Term Goals - 10/11/18 1326      PT SHORT TERM GOAL #1   Title   Independent with initial HEP to address core/ LE flexibility and strengthening    Status  Achieved    Target Date  09/27/18        PT Long Term Goals - 09/12/18 1357      PT LONG TERM GOAL #1   Title  Independent with ongoing HEP incorporating PWR! Moves as indicated    Status  On-going    Target Date  11/01/18      PT LONG TERM GOAL #2   Title  B LE strength >/= 4/5 to 4+/5 for improved stability    Status  On-going    Target Date  11/01/18      PT LONG TERM GOAL #3   Title  Patient to score <14 sec on TUG testing with LRAD to decrease risk of falls.    Status  On-going    Target Date  11/01/18      PT LONG TERM GOAL #4   Title  Patient to score </=16 sec on 5x STS without UEs to decrease risk of falls.    Status  On-going    Target Date  11/01/18      PT LONG TERM GOAL #5   Title  Patient will demonstrate increase in gait speed to >/= 2.62 ft/sec with LRAD to promote increased safety with community ambulation    Status  On-going    Target Date  11/01/18      PT LONG TERM GOAL #6   Title  Patient will verbalize understanding of online or community based exercise programs geared toward Parkinson's disease to promote ongoing wellness    Status  On-going    Target Date  11/01/18            Plan - 10/11/18 1326    Clinical Impression Statement  Bill Guerrero reporting increased LBP today after increased activity/lifting over the weekend. Also feels like L upper shoulder pain/soreness limits him with some of the PWR! Moves but admits to limited follow through with the HEP provided for this problem. Addressed increased muscle tension with STM and manual TPR to L UT & LS with patient  reporting good relief of pain. Reviewed relevant HEP for all stretches and exercises except TB rows as patient reports this is the one exercise he tends to remember to do. Remainder of session focusing on further instruction in Standing PWR! Moves and stepping patterns with education in carryover in  relation to reducing freezing episodes with walking.    Comorbidities  chronic LBP; s/p cervical & lumbar laminectomies; stage 4 metastatic melanoma - currently in remission; L THR; R RTC repair; bipolar; anxiety; depression    Rehab Potential  Good    PT Frequency  2x / week    PT Duration  8 weeks    PT Treatment/Interventions  Patient/family education;Neuromuscular re-education;Therapeutic exercise;Therapeutic activities;Functional mobility training;Gait training;Stair training;Balance training;Manual techniques;Dry needling;Taping;Electrical Stimulation;Moist Heat;ADLs/Self Care Home Management    PT Next Visit Plan  10th visit PN; Assess VS including potential orthostatic BP as indicated; Review neck stretches & postural exercises; Review supine, seated & standing PWR! Moves PRN; bed mobility; LE stretching & core/LE strengthening; Gait training with rollator    PT Home Exercise Plan  Seated & standing PWR! Moves; seated LE stretching & core/LE strengthening; neck stretches & postural exercises    Consulted and Agree with Plan of Care  Patient       Patient will benefit from skilled therapeutic intervention in order to improve the following deficits and impairments:  Decreased balance, Abnormal gait, Difficulty walking, Decreased mobility, Decreased strength, Decreased activity tolerance, Decreased safety awareness, Decreased knowledge of use of DME, Postural dysfunction, Improper body mechanics, Pain, Dizziness  Visit Diagnosis: Unsteadiness on feet  Difficulty in walking, not elsewhere classified  Muscle weakness (generalized)  Repeated falls  Chronic bilateral low back pain without sciatica     Problem List Patient Active Problem List   Diagnosis Date Noted  . Chronic midline low back pain without sciatica 12/06/2017  . Lumbar post-laminectomy syndrome 12/06/2017  . Cervical post-laminectomy syndrome 12/06/2017  . Parkinson's disease ([place]) 12/06/2017  . Malfunction of  penile prosthesis ([place]) 10/09/2012  . Hyperlipemia   . Bipolar 1 disorder ([place])     Percival Spanish, PT, MPT 10/11/2018, 3:48 PM  [facility] [address]  [place], [place], [address] Phone: [phone]   Fax:  [phone]  Name: Bill Guerrero MRN: [id number] Date of Birth: 04/10/1950

## 2018-10-12 ENCOUNTER — Other Ambulatory Visit: Payer: Self-pay

## 2018-10-12 ENCOUNTER — Telehealth: Payer: Self-pay | Admitting: Psychiatry

## 2018-10-12 DIAGNOSIS — B353 Tinea pedis: Secondary | ICD-10-CM | POA: Diagnosis not present

## 2018-10-12 DIAGNOSIS — Z85820 Personal history of malignant melanoma of skin: Secondary | ICD-10-CM | POA: Diagnosis not present

## 2018-10-12 DIAGNOSIS — B351 Tinea unguium: Secondary | ICD-10-CM | POA: Diagnosis not present

## 2018-10-12 DIAGNOSIS — S81801A Unspecified open wound, right lower leg, initial encounter: Secondary | ICD-10-CM | POA: Diagnosis not present

## 2018-10-12 MED ORDER — QUETIAPINE FUMARATE ER 300 MG PO TB24: 300.0000 mg | ORAL_TABLET | Freq: Every day | ORAL | 3 refills | Status: DC

## 2018-10-12 MED ORDER — QUETIAPINE FUMARATE ER 300 MG PO TB24: 300.0000 mg | ORAL_TABLET | Freq: Every day | ORAL | 0 refills | Status: DC

## 2018-10-12 NOTE — Telephone Encounter (Signed)
rx sent to [facility] for 90 day and [facility] for 2 week supply

## 2018-10-12 NOTE — Telephone Encounter (Signed)
Pt is getting Seroquel thru pt assistance but is low now. We will send in a refill but he will need a short supply called in to local pharmacy to get him thru til they mail his meds to him from pt assit. Please send in a 14 day RX of Seroquel to [facility] on [address], in [place]

## 2018-10-13 ENCOUNTER — Ambulatory Visit: Payer: Medicare Other | Admitting: Physical Therapy

## 2018-10-15 DIAGNOSIS — Z23 Encounter for immunization: Secondary | ICD-10-CM | POA: Diagnosis not present

## 2018-10-17 ENCOUNTER — Other Ambulatory Visit: Payer: Self-pay

## 2018-10-17 ENCOUNTER — Ambulatory Visit: Payer: Medicare Other | Admitting: Physical Therapy

## 2018-10-17 ENCOUNTER — Encounter: Payer: Self-pay | Admitting: Physical Therapy

## 2018-10-17 VITALS — BP 124/68 | HR 83

## 2018-10-17 DIAGNOSIS — M6281 Muscle weakness (generalized): Secondary | ICD-10-CM

## 2018-10-17 DIAGNOSIS — R2681 Unsteadiness on feet: Secondary | ICD-10-CM

## 2018-10-17 DIAGNOSIS — R262 Difficulty in walking, not elsewhere classified: Secondary | ICD-10-CM

## 2018-10-17 DIAGNOSIS — M545 Low back pain, unspecified: Secondary | ICD-10-CM

## 2018-10-17 DIAGNOSIS — R296 Repeated falls: Secondary | ICD-10-CM

## 2018-10-17 DIAGNOSIS — G8929 Other chronic pain: Secondary | ICD-10-CM

## 2018-10-17 NOTE — Therapy (Signed)
[facility] [address]  [place], [place], [address] Phone: [phone]   Fax:  [phone]  Physical Therapy Treatment / Progress Note  Patient Details  Name: Bill Guerrero MRN: [id number] Date of Birth: January 18, 1951 Referring Provider (PT): Alonza Bogus, MD   Progress Note  Reporting Period 09/06/2018 to 10/17/2018  See note below for Objective Data and Assessment of Progress/Goals.     Encounter Date: 10/17/2018  PT End of Session - 10/17/18 1314    Visit Number  10    Number of Visits  16    Date for PT Re-Evaluation  11/01/18    Authorization Type  Medicare & AARP    PT Start Time  1314    PT Stop Time  1415    PT Time Calculation (min)  61 min    Activity Tolerance  Patient tolerated treatment well    Behavior During Therapy  WFL for tasks assessed/performed       Past Medical History:  Diagnosis Date  . Arthritis    hips replacement  . Bipolar 1 disorder ([place])   . Hyperlipemia   . Hypertension   . Infection of prosthesis ([place])    penile implant with abscess with drainage of scrotum -"pinkish tan""no odor"  . Multiple body piercings   . Tremor    RT HAND    Past Surgical History:  Procedure Laterality Date  . BACK SURGERY     CERVICAL AND LUMBAR  . GROIN MASS OPEN BIOPSY     MASS REMOVED FROM GROIN  . HEMORROIDECTOMY    . HERNIA REPAIR     BIL IN HERNIA  . JOINT REPLACEMENT     TOTAL LEFT HIP  . PENILE PROSTHESIS IMPLANT N/A 06/17/2012   Procedure: THREE PIECE PENILE PROTHESIS INFLATABLE-COLOPLAST (PENILE SCROTAL APPROACH);  Surgeon: Fredricka Bonine, MD;  Location: [facility];  Service: Urology;  Laterality: N/A;  . PENILE PROSTHESIS IMPLANT N/A 10/07/2012   Procedure: PENILE PROTHESIS REMOVAL AND REPLACEMENT INFLATABLE, PENILE SCROTAL APPROACH;  Surgeon: Fredricka Bonine, MD;  Location: [facility];  Service: Urology;  Laterality: N/A;  . PILONIDAL CYST EXCISION  1986  . ROTATOR  CUFF REPAIR     RIGHT  . VASECTOMY Bilateral 06/17/2012   Procedure: VASECTOMY;  Surgeon: Fredricka Bonine, MD;  Location: [facility];  Service: Urology;  Laterality: Bilateral;    Vitals:   10/17/18 1318  BP: 124/68  Pulse: 83  SpO2: 98%    Subjective Assessment - 10/17/18 1318    Subjective  Pt reporting increased LBP since last PT session. States he doesn't think any the exercsies/activities caused it, but does not know what else may have triggered the increase in pain.    Pertinent History  Parkinson's x 5 yrs    Patient Stated Goals  "be able to join [employer] boxing class (long term); be able to take care of more things around the house"    Currently in Pain?  Yes    Pain Score  3    up to 6-7/10 with some movements   Pain Location  Back    Pain Orientation  Lower    Pain Descriptors / Indicators  Tightness    Pain Type  Chronic pain    Pain Frequency  Intermittent    Pain Onset  More than a month ago   ~1 yr        [facility] PT Assessment - 10/17/18 1314  Assessment   Medical Diagnosis  Parkinson's disease    Referring Provider (PT)  Alonza Bogus, MD    Onset Date/Surgical Date  --   ~5 yrs ago   Next MD Visit  02/28/2019      Strength   Overall Strength Comments  tested in sitting    Right Hip Flexion  4/5    Right Hip Extension  4-/5    Right Hip External Rotation   4+/5    Right Hip Internal Rotation  4+/5    Right Hip ABduction  4/5    Right Hip ADduction  4/5    Left Hip Flexion  4/5    Left Hip Extension  4-/5    Left Hip External Rotation  4/5    Left Hip Internal Rotation  4+/5    Left Hip ABduction  4/5    Left Hip ADduction  4/5    Right Knee Flexion  4+/5    Right Knee Extension  4+/5    Left Knee Flexion  4+/5    Left Knee Extension  4+/5    Right Ankle Dorsiflexion  4+/5    Right Ankle Plantar Flexion  4+/5    Left Ankle Dorsiflexion  4+/5    Left Ankle Plantar Flexion  4+/5      Ambulation/Gait   Gait velocity  2.84 ft/sec with  SPC      Standardized Balance Assessment   Five times sit to stand comments   25.87 sec w/o UE assist except 1 UE push from armrests on 4th rep; 2nd attempt - 18.97 sec w/o UE assist    10 Meter Walk  11.54 sec with SPC      Berg Balance Test   Sit to Stand  Able to stand without using hands and stabilize independently    Standing Unsupported  Able to stand safely 2 minutes    Sitting with Back Unsupported but Feet Supported on Floor or Stool  Able to sit safely and securely 2 minutes    Stand to Sit  Sits safely with minimal use of hands    Transfers  Able to transfer safely, minor use of hands    Standing Unsupported with Eyes Closed  Able to stand 10 seconds with supervision    Standing Unsupported with Feet Together  Able to place feet together independently and stand for 1 minute with supervision    From Standing, Reach Forward with Outstretched Arm  Can reach forward >12 cm safely (5")    From Standing Position, Pick up Object from Floor  Able to pick up shoe, needs supervision    From Standing Position, Turn to Look Behind Over each Shoulder  Turn sideways only but maintains balance    Turn 360 Degrees  Able to turn 360 degrees safely but slowly    Standing Unsupported, Alternately Place Feet on Step/Stool  Able to stand independently and safely and complete 8 steps in 20 seconds    Standing Unsupported, One Foot in Front  Able to plae foot ahead of the other independently and hold 30 seconds    Standing on One Leg  Able to lift leg independently and hold 5-10 seconds    Total Score  46    Berg comment:  46-51 moderate fall risk (>50%)       Timed Up and Go Test   Normal TUG (seconds)  12.87   with SPC   TUG Comments  Normal: >13.5 sec indicates high fall risk  [facility] PT Treatment/Exercise - 10/17/18 1314      Lumbar Exercises: Stretches   Quadruped Mid Back Stretch  30 seconds;3 reps    Quadruped Mid Back Stretch Limitations  3-way seated  prayer stretch with green Pball      Knee/Hip Exercises: Aerobic   Nustep  L5 x 6 min - UE/LE to promote reciprocal motion      Modalities   Modalities  Moist Heat      Moist Heat Therapy   Number Minutes Moist Heat  10 Minutes    Moist Heat Location  Lumbar Spine               PT Short Term Goals - 10/17/18 1314      PT SHORT TERM GOAL #1   Title  Independent with initial HEP to address core/ LE flexibility and strengthening    Status  Achieved        PT Long Term Goals - 10/17/18 1329      PT LONG TERM GOAL #1   Title  Independent with ongoing HEP incorporating PWR! Moves as indicated    Status  Partially Met    Target Date  11/01/18      PT LONG TERM GOAL #2   Title  B LE strength >/= 4/5 to 4+/5 for improved stability    Status  Partially Met    Target Date  11/01/18      PT LONG TERM GOAL #3   Title  Patient to score <14 sec on TUG testing with LRAD to decrease risk of falls.    Status  Achieved    Target Date  11/01/18      PT LONG TERM GOAL #4   Title  Patient to score </=16 sec on 5x STS without UEs to decrease risk of falls.    Status  On-going    Target Date  11/01/18      PT LONG TERM GOAL #5   Title  Patient will demonstrate increase in gait speed to >/= 2.62 ft/sec with LRAD to promote increased safety with community ambulation    Status  Achieved    Target Date  11/01/18      PT LONG TERM GOAL #6   Title  Patient will verbalize understanding of online or community based exercise programs geared toward Parkinson's disease to promote ongoing wellness    Status  On-going    Target Date  11/01/18            Plan - 10/17/18 1323    Clinical Impression Statement  Bill Guerrero noting benefit from PT with increasing ease of mobility and daily tasks within home. B LE strength improving with only mild proximal weakness still present. Significant gains noted in gait speed to 2.84 ft/sec with SPC from 1.76 ft/sec indicating increasing safety with  community ambulation. Balance testing revealing decreasing fall risk with TUG now below fall risk threshold at 12.87 sec with SPC, 5x STS reduced to 18.97 sec w/o UE assist from 22.35 sec with need for UE assist, and Berg increased from 34/56 to 46/56. All STGs met and majority of LTGs met or partially met. Bill Guerrero will continue to benefit from skilled PT to further address strength deficits, work on movement patterns to reduce freezing episodes, and address back pain as indicated to allow for improved activity tolerance.    Comorbidities  chronic LBP; s/p cervical & lumbar laminectomies; stage 4 metastatic melanoma - currently in remission; L THR; R RTC repair;  bipolar; anxiety; depression    Rehab Potential  Good    PT Frequency  2x / week    PT Duration  8 weeks    PT Treatment/Interventions  Patient/family education;Neuromuscular re-education;Therapeutic exercise;Therapeutic activities;Functional mobility training;Gait training;Stair training;Balance training;Manual techniques;Dry needling;Taping;Electrical Stimulation;Moist Heat;ADLs/Self Care Home Management    PT Next Visit Plan  Assess VS including potential orthostatic BP as indicated; Review neck stretches & postural exercises; Review supine, seated & standing PWR! Moves PRN; bed mobility; LE stretching & core/LE strengthening; Gait training with rollator    PT Home Exercise Plan  Seated & standing PWR! Moves; seated LE stretching & core/LE strengthening; neck stretches & postural exercises    Consulted and Agree with Plan of Care  Patient       Patient will benefit from skilled therapeutic intervention in order to improve the following deficits and impairments:  Decreased balance, Abnormal gait, Difficulty walking, Decreased mobility, Decreased strength, Decreased activity tolerance, Decreased safety awareness, Decreased knowledge of use of DME, Postural dysfunction, Improper body mechanics, Pain, Dizziness  Visit Diagnosis: Unsteadiness on  feet  Difficulty in walking, not elsewhere classified  Muscle weakness (generalized)  Repeated falls  Chronic bilateral low back pain without sciatica     Problem List Patient Active Problem List   Diagnosis Date Noted  . Chronic midline low back pain without sciatica 12/06/2017  . Lumbar post-laminectomy syndrome 12/06/2017  . Cervical post-laminectomy syndrome 12/06/2017  . Parkinson's disease ([place]) 12/06/2017  . Malfunction of penile prosthesis ([place]) 10/09/2012  . Hyperlipemia   . Bipolar 1 disorder ([place])     Percival Spanish, PT, MPT 10/17/2018, 4:11 PM  [facility] [address]  [place], [place], [address] Phone: [phone]   Fax:  [phone]  Name: Bill Guerrero MRN: [id number] Date of Birth: 10-12-1950

## 2018-10-18 ENCOUNTER — Ambulatory Visit (INDEPENDENT_AMBULATORY_CARE_PROVIDER_SITE_OTHER): Payer: Medicare Other | Admitting: Psychiatry

## 2018-10-18 ENCOUNTER — Other Ambulatory Visit: Payer: Self-pay | Admitting: Neurology

## 2018-10-18 ENCOUNTER — Other Ambulatory Visit: Payer: Self-pay

## 2018-10-18 ENCOUNTER — Other Ambulatory Visit: Payer: Self-pay | Admitting: Psychiatry

## 2018-10-18 ENCOUNTER — Encounter: Payer: Self-pay | Admitting: Psychiatry

## 2018-10-18 DIAGNOSIS — F3130 Bipolar disorder, current episode depressed, mild or moderate severity, unspecified: Secondary | ICD-10-CM

## 2018-10-18 DIAGNOSIS — F411 Generalized anxiety disorder: Secondary | ICD-10-CM

## 2018-10-18 DIAGNOSIS — F40240 Claustrophobia: Secondary | ICD-10-CM | POA: Diagnosis not present

## 2018-10-18 DIAGNOSIS — F4001 Agoraphobia with panic disorder: Secondary | ICD-10-CM

## 2018-10-18 DIAGNOSIS — F5105 Insomnia due to other mental disorder: Secondary | ICD-10-CM | POA: Diagnosis not present

## 2018-10-18 MED ORDER — LORAZEPAM 2 MG PO TABS: ORAL_TABLET | ORAL | 0 refills | Status: DC

## 2018-10-18 NOTE — Telephone Encounter (Signed)
Requested Prescriptions   Pending Prescriptions Disp Refills  . pramipexole (MIRAPEX) 0.5 MG tablet [Pharmacy Med Name: PRAMIPEXOLE 0.5 MG TABLET] 66 tablet 3    Sig: TAKE ONE TABLET BY MOUTH THREE TIMES A DAY   Rx last filled: 08/17/18 #66 3 refills  Pt last seen:08/23/18  Follow up appt scheduled: 02/28/2019

## 2018-10-18 NOTE — Progress Notes (Signed)
Amogh Beauman Ormond [id number] 11-May-1950 68 y.o.  Subjective:   Patient ID:  Bill Guerrero is a 68 y.o. (DOB 02-Sep-1950) male.  Chief Complaint:  Chief Complaint  Patient presents with  . Follow-up    Medication Management  . Other    Bipolar 1  . Anxiety  . Depression    HPI Bill Guerrero presents to the office today for follow-up of mood disorder, anxiety, and chronic insomnia.  He is chronically stressed and down due to the combination of dealing with Parkinson's disease and history of cancer.  Last seen in December.  No meds were changed.  "Made it longer than they thought I would".  Still in remission with melanoma with follow up tomorrow. Having PT for Parkinson's and it's helped.  But hard to get around.   Still has bad days emotionally up to a week at a time.  Anxiety is worse than depression.  Easily overwhelmed.    Pattern of EMA at 3 am and then falls asleep watching TV.  Then naps.  Satisfied with meds.  No other new concerns.  Rarely uses Ativan except when goes to doctors' offices or with CT scans Dt claustrophobia. Pt reports that mood is Anxious and describes anxiety as Moderate. Anxiety symptoms include: Excessive Worry,. Pt reports has interrupted sleep. Pt reports that appetite is good. Pt reports that energy is lethargic and no change. Concentration is down slightly. Suicidal thoughts:  denied by patient.  Less dep and anxiety than last visit.  Discussed concerns about the cost of Seroquel Exar and how bad he feels if he runs out of the medication.  Doing PT may be helping mood.  Past Psychiatric Medication Trials:   Review of Systems:  Review of Systems  Constitutional: Positive for fatigue.  Musculoskeletal: Positive for back pain and gait problem.  Neurological: Positive for dizziness, syncope and weakness. Negative for tremors.       Balance problems from PD.  Psychiatric/Behavioral: Positive for sleep disturbance. Negative for  agitation, behavioral problems, confusion, decreased concentration, dysphoric mood, hallucinations, self-injury and suicidal ideas. The patient is nervous/anxious. The patient is not hyperactive.   Frequent falls are a problem.  Can't pick himself up without help.  Medications: I have reviewed the patient's current medications.  Current Outpatient Medications  Medication Sig Dispense Refill  . Ascorbic Acid (VITAMIN C) 1000 MG tablet Take 1,000 mg by mouth 2 (two) times daily.     . B Complex-C (B-COMPLEX WITH VITAMIN C) tablet Take 1 tablet by mouth daily.    . carbidopa-levodopa (SINEMET CR) 50-200 MG tablet Take 1 tablet by mouth at bedtime. (Patient taking differently: Take 3.5 tablets by mouth daily. ) 30 tablet 5  . Cholecalciferol (VITAMIN D-3) 5000 UNITS TABS Take 1 tablet by mouth daily.    . Coenzyme Q10 200 MG capsule Take 200 mg by mouth 2 (two) times daily.    Marland Kitchen DHEA 50 MG TABS Take 1 tablet by mouth 2 (two) times daily.    . fenofibrate 160 MG tablet Take 160 mg by mouth daily before breakfast.    . fish oil-omega-3 fatty acids 1000 MG capsule Take 6,000 mg by mouth 4 (four) times daily - after meals and at bedtime. 2000 mg TID    . lamoTRIgine (LAMICTAL) 200 MG tablet TAKE ONE TABLET BY MOUTH EVERY NIGHT AT BEDTIME 90 tablet 0  . losartan (COZAAR) 50 MG tablet Take 50 mg by mouth daily. Pt. Is unsure of Mg.    Marland Kitchen  magnesium oxide (MAG-OX) 400 MG tablet Take 400 mg by mouth daily.    . niacin 500 MG tablet Take 500 mg by mouth daily with breakfast.    . OXcarbazepine (TRILEPTAL) 150 MG tablet TAKE ONE TABLET BY MOUTH TWICE A DAY 60 tablet 1  . Potassium 99 MG TABS Take 1 tablet by mouth daily.     . pramipexole (MIRAPEX) 0.5 MG tablet TAKE ONE TABLET BY MOUTH THREE TIMES A DAY 66 tablet 3  . pravastatin (PRAVACHOL) 80 MG tablet Take 80 mg by mouth daily before breakfast.     . QUEtiapine (SEROQUEL XR) 300 MG 24 hr tablet Take 1 tablet (300 mg total) by mouth at bedtime. 14 tablet 0   . valsartan (DIOVAN) 160 MG tablet Take 160 mg by mouth daily.  5  . amantadine (SYMMETREL) 100 MG capsule Take 1 capsule (100 mg total) by mouth 2 (two) times daily. (Patient not taking: Reported on 10/18/2018) 180 capsule 1   No current facility-administered medications for this visit.     Medication Side Effects: None  Allergies: No Known Allergies  Past Medical History:  Diagnosis Date  . Arthritis    hips replacement  . Bipolar 1 disorder ([place])   . Hyperlipemia   . Hypertension   . Infection of prosthesis ([place])    penile implant with abscess with drainage of scrotum -"pinkish tan""no odor"  . Multiple body piercings   . Tremor    RT HAND    Family History  Problem Relation Age of Onset  . Leukemia Mother   . Healthy Sister   . Healthy Sister   . Healthy Son   . Healthy Son   . Healthy Daughter     Social History   Socioeconomic History  . Marital status: Married    Spouse name: Not on file  . Number of children: 3  . Years of education: Not on file  . Highest education level: 12th grade  Occupational History  . Not on file  Social Needs  . Financial resource strain: Not on file  . Food insecurity    Worry: Not on file    Inability: Not on file  . Transportation needs    Medical: Not on file    Non-medical: Not on file  Tobacco Use  . Smoking status: Never Smoker  . Smokeless tobacco: Never Used  Substance and Sexual Activity  . Alcohol use: No    Alcohol/week: 1.0 standard drinks    Types: 1 Glasses of wine per week    Comment: no alcohol in 2 years  . Drug use: No  . Sexual activity: Yes  Lifestyle  . Physical activity    Days per week: Not on file    Minutes per session: Not on file  . Stress: Not on file  Relationships  . Social Herbalist on phone: Not on file    Gets together: Not on file    Attends religious service: Not on file    Active member of club or organization: Not on file    Attends meetings of clubs or  organizations: Not on file    Relationship status: Not on file  . Intimate partner violence    Fear of current or ex partner: Not on file    Emotionally abused: Not on file    Physically abused: Not on file    Forced sexual activity: Not on file  Other Topics Concern  . Not on file  Social History Narrative  . Not on file    Past Medical History, Surgical history, Social history, and Family history were reviewed and updated as appropriate.   Please see review of systems for further details on the patient's review from today.   Objective:   Physical Exam:  There were no vitals taken for this visit.  Physical Exam Constitutional:      General: He is not in acute distress.    Appearance: Normal appearance. He is well-developed. He is obese.  Musculoskeletal:        General: No deformity.  Neurological:     Mental Status: He is alert and oriented to person, place, and time.     Motor: Tremor present.     Coordination: Coordination abnormal.     Gait: Gait abnormal.     Comments: Uses cane for balance  Psychiatric:        Attention and Perception: Attention and perception normal.        Mood and Affect: Mood is anxious. Mood is not depressed. Affect is not labile, blunt, angry or inappropriate.        Speech: Speech normal. Speech is not slurred.        Behavior: Behavior is slowed.        Thought Content: Thought content normal. Thought content is not paranoid. Thought content does not include homicidal or suicidal ideation. Thought content does not include homicidal or suicidal plan.        Cognition and Memory: Cognition normal.        Judgment: Judgment normal.     Comments: Insight intact. No auditory or visual hallucinations. No delusions.  Less preoccupation with health.     Lab Review:     Component Value Date/Time   NA 141 03/30/2016 1219   K 3.7 03/30/2016 1219   CL 107 03/30/2016 1219   CO2 25 03/30/2016 1219   GLUCOSE 97 03/30/2016 1219   BUN 17  03/30/2016 1219   CREATININE 0.80 03/30/2016 1219   CALCIUM 9.4 03/30/2016 1219   PROT 6.9 03/30/2016 1219   ALBUMIN 4.7 03/30/2016 1219   AST 21 03/30/2016 1219   ALT 27 03/30/2016 1219   ALKPHOS 65 03/30/2016 1219   BILITOT 0.6 03/30/2016 1219   GFRNONAA 86 (L) 10/06/2012 1130   GFRAA >90 10/06/2012 1130       Component Value Date/Time   WBC 5.6 11/26/2014 1133   RBC 4.04 (L) 11/26/2014 1133   HGB 12.8 (L) 11/26/2014 1133   HCT 38.8 (L) 11/26/2014 1133   PLT 195.0 11/26/2014 1133   MCV 96.2 11/26/2014 1133   MCH 31.9 10/06/2012 1130   MCHC 33.0 11/26/2014 1133   RDW 13.6 11/26/2014 1133   LYMPHSABS 1.1 11/26/2014 1133   MONOABS 0.6 11/26/2014 1133   EOSABS 0.1 11/26/2014 1133   BASOSABS 0.0 11/26/2014 1133    Lithium Lvl  Date Value Ref Range Status  08/17/2010 0.44 (L) 0.80 - 1.40 mEq/L Final     No results found for: PHENYTOIN, PHENOBARB, VALPROATE, CBMZ   .res Assessment: Plan:    Bipolar I disorder, most recent episode depressed ([place])  Generalized anxiety disorder  Claustrophobia  Panic disorder with agoraphobia  Insomnia due to mental condition   Disc balancing benefit vs SE of Seroquel in view of Parkinson's Dz.   Discussed potential metabolic side effects associated with atypical antipsychotics, as well as potential risk for movement side effects. Advised pt to contact office if movement side effects occur.  Currently seems to be doing ok with depression and mood swings.  Anxiety is not fully manageable but med changes unlikely to help.  Supportive therapy and health direction given severe dx PD and malignant melanoma.  Enc cont exercise which is helping.   Chronic insomnia is stable.  Asks for change in lorazepam for procedures for panic.  We discussed the short-term risks associated with benzodiazepines including sedation and increased fall risk among others.  Discussed long-term side effect risk including dependence, potential withdrawal  symptoms, and the potential eventual dose-related risk of dementia.  He is not taking much of this.  Disc costs of Rx and how to manage for ex with [employer] he forgot to look into this and he was reminded about it today.  He was given written information about it.  No med changes this visit.  Patient agrees with this plan.  FU 6 Mos  Lynder Parents, MD, DFAPA     Please see After Visit Summary for patient specific instructions.  Future Appointments  Date Time Provider [place]  10/19/2018 11:45 AM Percival Spanish, PT [facility]  10/24/2018  1:15 PM Percival Spanish, PT [facility]  10/27/2018  2:00 PM Percival Spanish, PT [facility]  10/31/2018  1:15 PM Percival Spanish, PT [facility]  02/28/2019 11:15 AM [name], [name], DO [facility] None    No orders of the defined types were placed in this encounter.     -------------------------------

## 2018-10-18 NOTE — Patient Instructions (Signed)
Look up [employer] on Seroquel XR

## 2018-10-19 ENCOUNTER — Other Ambulatory Visit: Payer: Self-pay

## 2018-10-19 ENCOUNTER — Ambulatory Visit: Payer: Medicare Other | Admitting: Physical Therapy

## 2018-10-19 ENCOUNTER — Encounter: Payer: Self-pay | Admitting: Physical Therapy

## 2018-10-19 VITALS — BP 144/82 | HR 74

## 2018-10-19 DIAGNOSIS — M6281 Muscle weakness (generalized): Secondary | ICD-10-CM

## 2018-10-19 DIAGNOSIS — R2681 Unsteadiness on feet: Secondary | ICD-10-CM | POA: Diagnosis not present

## 2018-10-19 DIAGNOSIS — R296 Repeated falls: Secondary | ICD-10-CM

## 2018-10-19 DIAGNOSIS — M545 Low back pain, unspecified: Secondary | ICD-10-CM

## 2018-10-19 DIAGNOSIS — G8929 Other chronic pain: Secondary | ICD-10-CM

## 2018-10-19 DIAGNOSIS — R262 Difficulty in walking, not elsewhere classified: Secondary | ICD-10-CM

## 2018-10-19 NOTE — Therapy (Signed)
[facility] [place] [address]  [place], [place], [address] Phone: [phone]   Fax:  [phone]  Physical Therapy Treatment  Patient Details  Name: Bill Guerrero MRN: [id number] Date of Birth: Feb 12, 1950 Referring Provider (PT): Alonza Bogus, MD   Encounter Date: 10/19/2018  PT End of Session - 10/19/18 1142    Visit Number  11    Number of Visits  16    Date for PT Re-Evaluation  11/01/18    Authorization Type  Medicare & AARP    PT Start Time  1142    PT Stop Time  1300    PT Time Calculation (min)  78 min    Activity Tolerance  Patient tolerated treatment well    Behavior During Therapy  [facility] for tasks assessed/performed       Past Medical History:  Diagnosis Date  . Arthritis    hips replacement  . Bipolar 1 disorder ([place])   . Hyperlipemia   . Hypertension   . Infection of prosthesis ([place])    penile implant with abscess with drainage of scrotum -"pinkish tan""no odor"  . Multiple body piercings   . Tremor    RT HAND    Past Surgical History:  Procedure Laterality Date  . BACK SURGERY     CERVICAL AND LUMBAR  . GROIN MASS OPEN BIOPSY     MASS REMOVED FROM GROIN  . HEMORROIDECTOMY    . HERNIA REPAIR     BIL IN HERNIA  . JOINT REPLACEMENT     TOTAL LEFT HIP  . PENILE PROSTHESIS IMPLANT N/A 06/17/2012   Procedure: THREE PIECE PENILE PROTHESIS INFLATABLE-COLOPLAST (PENILE SCROTAL APPROACH);  Surgeon: Fredricka Bonine, MD;  Location: [facility];  Service: Urology;  Laterality: N/A;  . PENILE PROSTHESIS IMPLANT N/A 10/07/2012   Procedure: PENILE PROTHESIS REMOVAL AND REPLACEMENT INFLATABLE, PENILE SCROTAL APPROACH;  Surgeon: Fredricka Bonine, MD;  Location: [facility];  Service: Urology;  Laterality: N/A;  . PILONIDAL CYST EXCISION  1986  . ROTATOR CUFF REPAIR     RIGHT  . VASECTOMY Bilateral 06/17/2012   Procedure: VASECTOMY;  Surgeon: Fredricka Bonine, MD;  Location: [facility];  Service:  Urology;  Laterality: Bilateral;    Vitals:   10/19/18 1147 10/19/18 1157 10/19/18 1259  BP: (!) 160/86 (!) 158/80 (!) 144/82  Pulse: 69 77 74  SpO2: 97% 98% 97%    Subjective Assessment - 10/19/18 1149    Pertinent History  Parkinson's x 5 yrs    Patient Stated Goals  "be able to join [employer] class (long term); be able to take care of more things around the house"    Currently in Pain?  Yes    Pain Score  3    2-3/10   Pain Location  Back    Pain Orientation  Lower    Pain Descriptors / Indicators  Tightness    Pain Type  Chronic pain    Pain Onset  More than a month ago   ~1 yr                      [facility] Adult PT Treatment/Exercise - 10/19/18 1142      Exercises   Exercises  Knee/Hip      Lumbar Exercises: Stretches   Passive Hamstring Stretch  Right;Left;30 seconds;1 rep    Passive Hamstring Stretch Limitations  supine with strap    Single Knee to Chest Stretch  Right;Left;30 seconds;1 rep    Single Knee to Chest Stretch Limitations  opposite knee flexed    Piriformis Stretch  Right;Left;30 seconds;1 rep    Piriformis Stretch Limitations  KTOS      Lumbar Exercises: Supine   Ab Set  10 reps;5 seconds    Clam  10 reps;3 seconds    Clam Limitations  alt hip ABD/ER with yellow TB    Bent Knee Raise  10 reps;3 seconds    Bent Knee Raise Limitations  brace marching    Bridge  10 reps;5 seconds    Bridge Limitations  + hip ABD isometric with yellow TB      Knee/Hip Exercises: Aerobic   Nustep  L5 x 6 min - UE/LE to promote reciprocal motion      Modalities   Modalities  Electrical Stimulation;Moist Heat      Moist Heat Therapy   Number Minutes Moist Heat  15 Minutes    Moist Heat Location  Lumbar Spine      Electrical Stimulation   Electrical Stimulation Location  lumbar paraspinals    Electrical Stimulation Action  IFC    Electrical Stimulation Parameters  80-150 Hz, intensity to pt tolerance x 15'    Electrical Stimulation Goals   Pain;Tone        [facility]) - 10/19/18 1142    PWR! exercises  Moves in supine    PWR! Up  x10    PWR! Rock  x10    PWR! Twist  x10   limited motion to L   PWR! Step  x10            PT Short Term Goals - 10/17/18 1314      PT SHORT TERM GOAL #1   Title  Independent with initial HEP to address core/ LE flexibility and strengthening    Status  Achieved        PT Long Term Goals - 10/17/18 1329      PT LONG TERM GOAL #1   Title  Independent with ongoing HEP incorporating PWR! Moves as indicated    Status  Partially Met    Target Date  11/01/18      PT LONG TERM GOAL #2   Title  B LE strength >/= 4/5 to 4+/5 for improved stability    Status  Partially Met    Target Date  11/01/18      PT LONG TERM GOAL #3   Title  Patient to score <14 sec on TUG testing with LRAD to decrease risk of falls.    Status  Achieved    Target Date  11/01/18      PT LONG TERM GOAL #4   Title  Patient to score </=16 sec on 5x STS without UEs to decrease risk of falls.    Status  On-going    Target Date  11/01/18      PT LONG TERM GOAL #5   Title  Patient will demonstrate increase in gait speed to >/= 2.62 ft/sec with LRAD to promote increased safety with community ambulation    Status  Achieved    Target Date  11/01/18      PT LONG TERM GOAL #6   Title  Patient will verbalize understanding of online or community based exercise programs geared toward Parkinson's disease to promote ongoing wellness    Status  On-going    Target Date  11/01/18            Plan -  10/19/18 1153    Clinical Impression Statement  [name] reporting lingering back pain since last week therefore focused on core and low back stretching and lumbopelvic strengthening in effort to help mitigate pain but minimal change noted. Reviewed Supine PWR! Moves with limited motion elicited with PWR! Up and PWR! Twist to L but no increased pain except slight "tightening" in low back with PWR! Step. Treatment concluded  with IFC estim and moist heat to further address LBP.    Comorbidities  chronic LBP; s/p cervical & lumbar laminectomies; stage 4 metastatic melanoma - currently in remission; L THR; R RTC repair; bipolar; anxiety; depression    Rehab Potential  Good    PT Frequency  2x / week    PT Duration  8 weeks    PT Treatment/Interventions  Patient/family education;Neuromuscular re-education;Therapeutic exercise;Therapeutic activities;Functional mobility training;Gait training;Stair training;Balance training;Manual techniques;Dry needling;Taping;Electrical Stimulation;Moist Heat;ADLs/Self Care Home Management    PT Next Visit Plan  Recert; Assess VS including potential orthostatic BP as indicated; Review neck stretches & postural exercises; Review supine, seated & standing PWR! Moves PRN; bed mobility; LE stretching & core/LE strengthening    PT Home Exercise Plan  Seated & standing PWR! Moves; seated LE stretching & core/LE strengthening; neck stretches & postural exercises    Consulted and Agree with Plan of Care  Patient       Patient will benefit from skilled therapeutic intervention in order to improve the following deficits and impairments:  Decreased balance, Abnormal gait, Difficulty walking, Decreased mobility, Decreased strength, Decreased activity tolerance, Decreased safety awareness, Decreased knowledge of use of DME, Postural dysfunction, Improper body mechanics, Pain, Dizziness  Visit Diagnosis: Unsteadiness on feet  Difficulty in walking, not elsewhere classified  Muscle weakness (generalized)  Repeated falls  Chronic bilateral low back pain without sciatica     Problem List Patient Active Problem List   Diagnosis Date Noted  . Chronic midline low back pain without sciatica 12/06/2017  . Lumbar post-laminectomy syndrome 12/06/2017  . Cervical post-laminectomy syndrome 12/06/2017  . Parkinson's disease ([place]) 12/06/2017  . 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  Hyperlipemia   . Bipolar 1 disorder ([place])     Percival Spanish, PT, MPT 10/19/2018, 1:04 PM  [facility] [address]  [place], [place], [address] Phone: [phone]   Fax:  [phone]  Name: Bill Guerrero MRN: [id number] Date of Birth: Aug 25, 1950

## 2018-10-24 ENCOUNTER — Encounter: Payer: Self-pay | Admitting: Physical Therapy

## 2018-10-26 ENCOUNTER — Other Ambulatory Visit: Payer: Self-pay | Admitting: Psychiatry

## 2018-10-27 ENCOUNTER — Encounter: Payer: Self-pay | Admitting: Physical Therapy

## 2018-10-31 ENCOUNTER — Other Ambulatory Visit: Payer: Self-pay

## 2018-10-31 ENCOUNTER — Ambulatory Visit: Payer: Medicare Other | Admitting: Physical Therapy

## 2018-10-31 ENCOUNTER — Encounter: Payer: Self-pay | Admitting: Physical Therapy

## 2018-10-31 VITALS — BP 128/70 | HR 74

## 2018-10-31 DIAGNOSIS — R2681 Unsteadiness on feet: Secondary | ICD-10-CM | POA: Diagnosis not present

## 2018-10-31 DIAGNOSIS — R262 Difficulty in walking, not elsewhere classified: Secondary | ICD-10-CM

## 2018-10-31 DIAGNOSIS — R296 Repeated falls: Secondary | ICD-10-CM

## 2018-10-31 DIAGNOSIS — M6281 Muscle weakness (generalized): Secondary | ICD-10-CM

## 2018-10-31 DIAGNOSIS — M545 Low back pain, unspecified: Secondary | ICD-10-CM

## 2018-10-31 DIAGNOSIS — G8929 Other chronic pain: Secondary | ICD-10-CM | POA: Diagnosis not present

## 2018-10-31 NOTE — Therapy (Signed)
[facility] [place] [address]  [place], [place], [address] Phone: [phone]   Fax:  [phone]  Physical Therapy Treatment  Patient Details  Name: Bill Guerrero MRN: [id number] Date of Birth: 1950-10-05 Referring Provider (PT): Alonza Bogus, MD   Encounter Date: 10/31/2018  PT End of Session - 10/31/18 1315    Visit Number  12    Number of Visits  20    Date for PT Re-Evaluation  11/28/18    Authorization Type  Medicare & AARP    PT Start Time  2952    PT Stop Time  1358    PT Time Calculation (min)  43 min    Activity Tolerance  Patient tolerated treatment well    Behavior During Therapy  [facility] for tasks assessed/performed       Past Medical History:  Diagnosis Date  . Arthritis    hips replacement  . Bipolar 1 disorder ([place])   . Hyperlipemia   . Hypertension   . 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 Multiple body piercings   . Tremor    RT HAND    Past Surgical History:  Procedure Laterality Date  . BACK SURGERY     CERVICAL AND LUMBAR  . GROIN MASS OPEN BIOPSY     MASS REMOVED FROM GROIN  . HEMORROIDECTOMY    . HERNIA REPAIR     BIL IN HERNIA  . JOINT REPLACEMENT     TOTAL LEFT HIP  . PENILE PROSTHESIS IMPLANT N/A 06/17/2012   Procedure: THREE PIECE PENILE PROTHESIS INFLATABLE-COLOPLAST (PENILE SCROTAL APPROACH);  Surgeon: Fredricka Bonine, MD;  [facility];  Service: Urology;  Laterality: N/A;  . PENILE PROSTHESIS IMPLANT N/A 10/07/2012   Procedure: PENILE PROTHESIS REMOVAL AND REPLACEMENT INFLATABLE, PENILE SCROTAL APPROACH;  Surgeon: Fredricka Bonine, MD;  Location: [facility];  Service: Urology;  Laterality: N/A;  . PILONIDAL CYST EXCISION  1986  . ROTATOR CUFF REPAIR     RIGHT  . VASECTOMY Bilateral 06/17/2012   Procedure: VASECTOMY;  Surgeon: Fredricka Bonine, MD;  Location: [facility];  Service:  Urology;  Laterality: Bilateral;    Vitals:   10/31/18 1323  BP: 128/70  Pulse: 74  SpO2: 97%    Subjective Assessment - 10/31/18 1324    Subjective  Pt reporting he fell yesterday while carrying a trash bin through his garage - he is uncertain if he tripped on his cane ot what else may have trigger the fall but he fell forward on top of the bin, bruising his ribs and hurting his back shoulders and knees.    Pertinent History  Parkinson's x 5 yrs    Patient Stated Goals  "be able to join [employer] boxing class (long term); be able to take care of more things around the house"    Currently in Pain?  Yes    Pain Score  0-No pain   up to 5/10 when moving   Pain Location  Back   neck, shoulders & knees s/p fall   Pain Descriptors / Indicators  Burning;Cramping    Pain Type  Acute pain    Pain Onset  In the past 7 days    Pain Frequency  Intermittent    Pain Onset  More than a month ago   ~1 yr        [facility] PT Assessment - 10/31/18  1315      Assessment   Medical Diagnosis  Parkinson's disease    Referring Provider (PT)  Alonza Bogus, MD    Onset Date/Surgical Date  --   ~5 yrs ago   Next MD Visit  02/28/2019      Prior Function   Level of Independence  Independent;Independent with basic ADLs;Needs assistance with homemaking;Independent with household mobility with device;Independent with community mobility with device    Vocation  Retired    Leisure  mostly sedentary; would like to be able to ride his bike on a Hospital doctor   Overall Strength Comments  tested in sitting - as of 10/17/18    Right Hip Flexion  4/5    Right Hip Extension  4-/5    Right Hip External Rotation   4+/5    Right Hip Internal Rotation  4+/5    Right Hip ABduction  4/5    Right Hip ADduction  4/5    Left Hip Flexion  4/5    Left Hip Extension  4-/5    Left Hip External Rotation  4/5    Left Hip Internal Rotation  4+/5    Left Hip ABduction  4/5    Left Hip ADduction  4/5    Right Knee  Flexion  4+/5    Right Knee Extension  4+/5    Left Knee Flexion  4+/5    Left Knee Extension  4+/5    Right Ankle Dorsiflexion  4+/5    Right Ankle Plantar Flexion  4+/5    Left Ankle Dorsiflexion  4+/5    Left Ankle Plantar Flexion  4+/5      Ambulation/Gait   Gait velocity  3.09 ft/sec with SPC      Standardized Balance Assessment   Standardized Balance Assessment  Dynamic Gait Index;Timed Up and Go Test;Five Times Sit to Stand;10 meter walk test    Five times sit to stand comments   21.57 sec w/o UE assist    10 Meter Walk  10.59 sec with SPC      Berg Balance Test   Sit to Stand  Able to stand without using hands and stabilize independently    Standing Unsupported  Able to stand safely 2 minutes    Sitting with Back Unsupported but Feet Supported on Floor or Stool  Able to sit safely and securely 2 minutes    Stand to Sit  Sits safely with minimal use of hands    Transfers  Able to transfer safely, minor use of hands    Standing Unsupported with Eyes Closed  Able to stand 10 seconds with supervision    Standing Unsupported with Feet Together  Able to place feet together independently and stand for 1 minute with supervision    From Standing, Reach Forward with Outstretched Arm  Can reach forward >12 cm safely (5")    From Standing Position, Pick up Object from Floor  Able to pick up shoe, needs supervision    From Standing Position, Turn to Look Behind Over each Shoulder  Turn sideways only but maintains balance    Turn 360 Degrees  Able to turn 360 degrees safely but slowly    Standing Unsupported, Alternately Place Feet on Step/Stool  Able to stand independently and safely and complete 8 steps in 20 seconds    Standing Unsupported, One Foot in Front  Able to plae foot ahead of the other independently and hold 30 seconds  Standing on One Leg  Able to lift leg independently and hold 5-10 seconds    Total Score  46    Berg comment:  46-51 moderate fall risk (>50%)    testing as  of 10/17/18     Dynamic Gait Index   Level Surface  Mild Impairment    Change in Gait Speed  Moderate Impairment    Gait with Horizontal Head Turns  Moderate Impairment    Gait with Vertical Head Turns  Moderate Impairment    Gait and Pivot Turn  Moderate Impairment    Step Over Obstacle  Moderate Impairment    Step Around Obstacles  Mild Impairment    Steps  Moderate Impairment    Total Score  10    DGI comment:  Scores of 19 or less are predictive of falls in older community living adults      Timed Up and Go Test   Normal TUG (seconds)  13.91   with SPC   TUG Comments  Normal: >13.5 sec indicates high fall risk                   [facility] Adult PT Treatment/Exercise - 10/31/18 1315      Exercises   Exercises  Knee/Hip      Knee/Hip Exercises: Aerobic   Nustep  L5 x 6 min - UE/LE to promote reciprocal motion               PT Short Term Goals - 10/17/18 1314      PT SHORT TERM GOAL #1   Title  Independent with initial HEP to address core/ LE flexibility and strengthening    Status  Achieved        PT Long Term Goals - 10/31/18 1315      PT LONG TERM GOAL #1   Title  Independent with ongoing HEP incorporating PWR! Moves as indicated    Status  Partially Met    Target Date  11/28/18      PT LONG TERM GOAL #2   Title  B LE strength >/= 4/5 to 4+/5 for improved stability    Status  Partially Met    Target Date  11/28/18      PT LONG TERM GOAL #3   Title  Patient to score <14 sec on TUG testing with LRAD to decrease risk of falls.    Status  Achieved   10/17/2018     PT LONG TERM GOAL #4   Title  Patient to score </=16 sec on 5x STS without UEs to decrease risk of falls.    Status  On-going    Target Date  11/28/18      PT LONG TERM GOAL #5   Title  Patient will demonstrate increase in gait speed to >/= 2.62 ft/sec with LRAD to promote increased safety with community ambulation    Status  Achieved   10/17/2018     PT LONG TERM GOAL #6    Title  Patient will verbalize understanding of online or community based exercise programs geared toward Parkinson's disease to promote ongoing wellness    Status  Partially Met    Target Date  11/28/18            Plan - 10/31/18 1351    Clinical Impression Statement  Bill Guerrero reporting a fall yesterday while carrying a trash bin to the garbage resulting in some bruising to his ribs and exacerbations of his back and shoulder pain. He  was uncertain of the trigger for the fall, but notes slower mobility since the fall. Balance testing with TUG and 5x STS slightly revealing slight increased time today however gait speed continues to improve, now up to 3.09 ft/sec. Dynamic gait assessment with DGI reveals high risk for falls at 10/24. All STGs met and majority of LTGs met or partially met, however given recent fall and ongoing deficits, will recommend recert for an additional 2x/wk x 4 wks to further address strength deficits, work on movement patterns to reduce freezing episodes and reduce fall risk, and address pain as indicated to allow for improved activity tolerance    Comorbidities  chronic LBP; s/p cervical & lumbar laminectomies; stage 4 metastatic melanoma - currently in remission; L THR; R RTC repair; bipolar; anxiety; depression    Rehab Potential  Good    PT Frequency  2x / week    PT Duration  8 weeks    PT Treatment/Interventions  Patient/family education;Neuromuscular re-education;Therapeutic exercise;Therapeutic activities;Functional mobility training;Gait training;Stair training;Balance training;Manual techniques;Dry needling;Taping;Electrical Stimulation;Moist Heat;ADLs/Self Care Home Management    PT Next Visit Plan  Assess VS including potential orthostatic BP as indicated; Review neck stretches & postural exercises; Review supine, seated & standing PWR! Moves PRN; bed mobility; LE stretching & core/LE strengthening    PT Home Exercise Plan  Seated & standing PWR! Moves; seated LE  stretching & core/LE strengthening; neck stretches & postural exercises    Consulted and Agree with Plan of Care  Patient       Patient will benefit from skilled therapeutic intervention in order to improve the following deficits and impairments:  Decreased balance, Abnormal gait, Difficulty walking, Decreased mobility, Decreased strength, Decreased activity tolerance, Decreased safety awareness, Decreased knowledge of use of DME, Postural dysfunction, Improper body mechanics, Pain, Dizziness  Visit Diagnosis: Unsteadiness on feet - Plan: PT plan of care cert/re-cert  Difficulty in walking, not elsewhere classified - Plan: PT plan of care cert/re-cert  Muscle weakness (generalized) - Plan: PT plan of care cert/re-cert  Repeated falls - Plan: PT plan of care cert/re-cert  Chronic bilateral low back pain without sciatica - Plan: PT plan of care cert/re-cert     Problem List Patient Active Problem List   Diagnosis Date Noted  . Chronic midline low back pain without sciatica 12/06/2017  . Lumbar post-laminectomy syndrome 12/06/2017  . Cervical post-laminectomy syndrome 12/06/2017  . Parkinson's disease ([place]) 12/06/2017  . 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 Hyperlipemia   . Bipolar 1 disorder ([place])     Percival Spanish, PT, MPT 10/31/2018, 8:34 PM  [facility] [address]  [place], [place], [address] Phone: [phone]   Fax:  [phone]  Name: Bill Guerrero MRN: [id number] Date of Birth: [birth year]

## 2018-11-03 ENCOUNTER — Ambulatory Visit: Payer: Medicare Other | Admitting: Physical Therapy

## 2018-11-07 ENCOUNTER — Ambulatory Visit: Payer: Medicare Other | Attending: Neurology | Admitting: Physical Therapy

## 2018-11-07 ENCOUNTER — Encounter: Payer: Self-pay | Admitting: Physical Therapy

## 2018-11-07 ENCOUNTER — Other Ambulatory Visit: Payer: Self-pay

## 2018-11-07 VITALS — BP 160/78 | HR 91

## 2018-11-07 DIAGNOSIS — R2681 Unsteadiness on feet: Secondary | ICD-10-CM

## 2018-11-07 DIAGNOSIS — R296 Repeated falls: Secondary | ICD-10-CM | POA: Diagnosis not present

## 2018-11-07 DIAGNOSIS — M545 Low back pain, unspecified: Secondary | ICD-10-CM

## 2018-11-07 DIAGNOSIS — M6281 Muscle weakness (generalized): Secondary | ICD-10-CM | POA: Diagnosis not present

## 2018-11-07 DIAGNOSIS — G8929 Other chronic pain: Secondary | ICD-10-CM | POA: Diagnosis not present

## 2018-11-07 DIAGNOSIS — R262 Difficulty in walking, not elsewhere classified: Secondary | ICD-10-CM

## 2018-11-07 NOTE — Patient Instructions (Signed)
    Home exercise program created by Geanie Pacifico, PT.  For questions, please contact Su Duma via phone at [phone] or email at [email or web address]  [facility] [address]  [address] [place], [place], [address] Phone: [phone]   Fax:  [phone]    

## 2018-11-07 NOTE — Therapy (Signed)
[facility] [address]  [place], [place], [address] Phone: [phone]   Fax:  [phone]  Physical Therapy Treatment  Patient Details  Name: Bill Guerrero MRN: [id number] Date of Birth: 1950-06-12 Referring Provider (PT): Alonza Bogus, MD   Encounter Date: 11/07/2018  PT End of Session - 11/07/18 1402    Visit Number  13    Number of Visits  20    Date for PT Re-Evaluation  11/28/18    Authorization Type  Medicare & AARP    PT Start Time  1402    PT Stop Time  1454    PT Time Calculation (min)  52 min    Activity Tolerance  Patient tolerated treatment well    Behavior During Therapy  [facility] for tasks assessed/performed       Past Medical History:  Diagnosis Date  . Arthritis    hips replacement  . Bipolar 1 disorder ([place])   . Hyperlipemia   . Hypertension   . Infection of prosthesis ([place])    penile implant with abscess with drainage of scrotum -"pinkish tan""no odor"  . Multiple body piercings   . Tremor    RT HAND    Past Surgical History:  Procedure Laterality Date  . BACK SURGERY     CERVICAL AND LUMBAR  . GROIN MASS OPEN BIOPSY     MASS REMOVED FROM GROIN  . HEMORROIDECTOMY    . HERNIA REPAIR     BIL IN HERNIA  . JOINT REPLACEMENT     TOTAL LEFT HIP  . PENILE PROSTHESIS IMPLANT N/A 06/17/2012   Procedure: THREE PIECE PENILE PROTHESIS INFLATABLE-COLOPLAST (PENILE SCROTAL APPROACH);  Surgeon: Fredricka Bonine, MD;  Location: [facility];  Service: Urology;  Laterality: N/A;  . PENILE PROSTHESIS IMPLANT N/A 10/07/2012   Procedure: PENILE PROTHESIS REMOVAL AND REPLACEMENT INFLATABLE, PENILE SCROTAL APPROACH;  Surgeon: Fredricka Bonine, MD;  [facility];  Service: Urology;  Laterality: N/A;  . PILONIDAL CYST EXCISION  1986  . ROTATOR CUFF REPAIR     RIGHT  . VASECTOMY Bilateral 06/17/2012   Procedure: VASECTOMY;  Surgeon: Fredricka Bonine, MD;  [facility];  Service:  Urology;  Laterality: Bilateral;    Vitals:   11/07/18 1406 11/07/18 1416  BP: (!) 158/84 (!) 160/78  Pulse: 79 91  SpO2: 97% 97%    Subjective Assessment - 11/07/18 1409    Subjective  Pt reporting he feels like he is having a harder time with his depression lately - feeling "sore all over" today.    Pertinent History  Parkinson's x 5 yrs    Patient Stated Goals  "be able to join [employer] boxing class (long term); be able to take care of more things around the house"    Currently in Pain?  Yes   "sore all over"   Pain Onset  In the past 7 days    Pain Onset  More than a month ago   ~1 yr                      [facility] Adult PT Treatment/Exercise - 11/07/18 1402      Ambulation/Gait   Gait Comments  Adjusted pt's cane 2 notches taller to promote increased upright posture. Pt has mutiple canes - pt instructed to make sure other canes match height of cane used today.      Therapeutic Activites    Therapeutic Activities  Other  Therapeutic Activities    Other Therapeutic Activities  Simulated walking through doorway working on techniques to minimize freezing of gait.      Knee/Hip Exercises: Aerobic   Nustep  L5 x 6 min - UE/LE to promote reciprocal motion      Shoulder Exercises: Seated   Horizontal ABduction  Both;10 reps;Theraband;Strengthening    Theraband Level (Shoulder Horizontal ABduction)  Level 2 (Red)    Horizontal ABduction Limitations  cues for upright posture & scap retraction    External Rotation  Both;10 reps;Theraband;Strengthening    Theraband Level (Shoulder External Rotation)  Level 1 (Yellow)    External Rotation Limitations  cues for upright posture & scap retraction    Diagonals  Both;10 reps;Theraband;Strengthening    Theraband Level (Shoulder Diagonals)  Level 1 (Yellow)    Diagonals Limitations  cues for upright posture & scap retraction             PT Education - 11/07/18 1452    Education Details  HEP update - neck postural  strengthening exercises    Person(s) Educated  Patient    Methods  Explanation;Demonstration;Verbal cues;Handout    Comprehension  Verbalized understanding;Returned demonstration;Verbal cues required;Need further instruction       PT Short Term Goals - 10/17/18 1314      PT SHORT TERM GOAL #1   Title  Independent with initial HEP to address core/ LE flexibility and strengthening    Status  Achieved        PT Long Term Goals - 10/31/18 1315      PT LONG TERM GOAL #1   Title  Independent with ongoing HEP incorporating PWR! Moves as indicated    Status  Partially Met    Target Date  11/28/18      PT LONG TERM GOAL #2   Title  B LE strength >/= 4/5 to 4+/5 for improved stability    Status  Partially Met    Target Date  11/28/18      PT LONG TERM GOAL #3   Title  Patient to score <14 sec on TUG testing with LRAD to decrease risk of falls.    Status  Achieved   10/17/2018     PT LONG TERM GOAL #4   Title  Patient to score </=16 sec on 5x STS without UEs to decrease risk of falls.    Status  On-going    Target Date  11/28/18      PT LONG TERM GOAL #5   Title  Patient will demonstrate increase in gait speed to >/= 2.62 ft/sec with LRAD to promote increased safety with community ambulation    Status  Achieved   10/17/2018     PT LONG TERM GOAL #6   Title  Patient will verbalize understanding of online or community based exercise programs geared toward Parkinson's disease to promote ongoing wellness    Status  Partially Met    Target Date  11/28/18            Plan - 11/07/18 1411    Clinical Impression Statement  Bill Guerrero reporting increased overall soreness today and noting he is having a harder time with his depression of late - feels frustrated by his family's lack of understanding and/or sensitivity to his deficits and problems he experiences with his Parkinson's. Encouraged patient to speak to a counselor and/or get involved with a Parkinson's support group to allow  for an outlet for his feelings. He continues to feel like he is  improving with PT, noting improving ability to move about in bed but feels freezing is still an issue especially when walking through his bathroom doorway, therefore worked on techniques to reduce freezing with walking through narrow spaces and suggested patient have someone place a tape line in the doorway as a visual target to step over when entering the bathroom as visual targets seem to help him clear his feet better. Treatment concluded with progression of postural strengthening to reinforce upright posture and reduce neck strain with HEP updated accordingly.    Comorbidities  chronic LBP; s/p cervical & lumbar laminectomies; stage 4 metastatic melanoma - currently in remission; L THR; R RTC repair; bipolar; anxiety; depression    Rehab Potential  Good    PT Frequency  2x / week    PT Duration  8 weeks    PT Treatment/Interventions  Patient/family education;Neuromuscular re-education;Therapeutic exercise;Therapeutic activities;Functional mobility training;Gait training;Stair training;Balance training;Manual techniques;Dry needling;Taping;Electrical Stimulation;Moist Heat;ADLs/Self Care Home Management    PT Next Visit Plan  Assess VS including potential orthostatic BP as indicated; Review neck stretches & postural exercises; Review supine, seated & standing PWR! Moves PRN; bed mobility; LE stretching & core/LE strengthening    PT Home Exercise Plan  Seated & standing PWR! Moves; seated LE stretching & core/LE strengthening; neck stretches & postural exercises    Consulted and Agree with Plan of Care  Patient       Patient will benefit from skilled therapeutic intervention in order to improve the following deficits and impairments:  Decreased balance, Abnormal gait, Difficulty walking, Decreased mobility, Decreased strength, Decreased activity tolerance, Decreased safety awareness, Decreased knowledge of use of DME, Postural dysfunction,  Improper body mechanics, Pain, Dizziness  Visit Diagnosis: Unsteadiness on feet  Difficulty in walking, not elsewhere classified  Muscle weakness (generalized)  Repeated falls  Chronic bilateral low back pain without sciatica     Problem List Patient Active Problem List   Diagnosis Date Noted  . Chronic midline low back pain without sciatica 12/06/2017  . Lumbar post-laminectomy syndrome 12/06/2017  . Cervical post-laminectomy syndrome 12/06/2017  . Parkinson's disease ([place]) 12/06/2017  . 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 Hyperlipemia   . Bipolar 1 disorder ([place])     Percival Spanish, PT, MPT 11/07/2018, 3:18 PM  [facility] [address]  [place], [place], [address] Phone: [phone]   Fax:  [phone]  Name: INDIGO CHADDOCK MRN: [id number] Date of Birth: March 22, 1950

## 2018-11-10 ENCOUNTER — Encounter: Payer: Self-pay | Admitting: Physical Therapy

## 2018-11-10 ENCOUNTER — Ambulatory Visit: Payer: Medicare Other | Admitting: Physical Therapy

## 2018-11-10 ENCOUNTER — Other Ambulatory Visit: Payer: Self-pay

## 2018-11-10 VITALS — BP 128/70 | HR 76

## 2018-11-10 DIAGNOSIS — M545 Low back pain, unspecified: Secondary | ICD-10-CM

## 2018-11-10 DIAGNOSIS — R262 Difficulty in walking, not elsewhere classified: Secondary | ICD-10-CM

## 2018-11-10 DIAGNOSIS — R2681 Unsteadiness on feet: Secondary | ICD-10-CM | POA: Diagnosis not present

## 2018-11-10 DIAGNOSIS — R296 Repeated falls: Secondary | ICD-10-CM | POA: Diagnosis not present

## 2018-11-10 DIAGNOSIS — M6281 Muscle weakness (generalized): Secondary | ICD-10-CM

## 2018-11-10 DIAGNOSIS — G8929 Other chronic pain: Secondary | ICD-10-CM | POA: Diagnosis not present

## 2018-11-10 NOTE — Therapy (Signed)
Bill Guerrero Phone: [phone]   Fax:  [phone]  Physical Therapy Treatment  Patient Details  Name: Bill Guerrero MRN: [id number] Date of Birth: 1950/08/03 Referring Provider (PT): Alonza Bogus, MD   Encounter Date: 11/10/2018  PT End of Session - 11/10/18 1101    Visit Number  14    Number of Visits  20    Date for PT Re-Evaluation  11/28/18    Authorization Type  Medicare & AARP    PT Start Time  1101    PT Stop Time  1143    PT Time Calculation (min)  42 min    Activity Tolerance  Patient tolerated treatment well    Behavior During Therapy  [facility] for tasks assessed/performed       Past Medical History:  Diagnosis Date  . Arthritis    hips replacement  . Bipolar 1 disorder ([place])   . Hyperlipemia   . Hypertension   . 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 Multiple body piercings   . Tremor    RT HAND    Past Surgical History:  Procedure Laterality Date  . BACK SURGERY     CERVICAL AND LUMBAR  . GROIN MASS OPEN BIOPSY     MASS REMOVED FROM GROIN  . HEMORROIDECTOMY    . HERNIA REPAIR     BIL IN HERNIA  . JOINT REPLACEMENT     TOTAL LEFT HIP  . PENILE PROSTHESIS IMPLANT N/A 06/17/2012   Procedure: THREE PIECE PENILE PROTHESIS INFLATABLE-COLOPLAST (PENILE SCROTAL APPROACH);  Surgeon: Fredricka Bonine, MD;  [facility];  Service: Urology;  Laterality: N/A;  . PENILE PROSTHESIS IMPLANT N/A 10/07/2012   Procedure: PENILE PROTHESIS REMOVAL AND REPLACEMENT INFLATABLE, PENILE SCROTAL APPROACH;  Surgeon: Fredricka Bonine, MD;  [facility];  Service: Urology;  Laterality: N/A;  . PILONIDAL CYST EXCISION  1986  . ROTATOR CUFF REPAIR     RIGHT  . VASECTOMY Bilateral 06/17/2012   Procedure: VASECTOMY;  Surgeon: Fredricka Bonine, MD;  [facility];  Service:  Urology;  Laterality: Bilateral;    Vitals:   11/10/18 1104  BP: 128/70  Pulse: 76  SpO2: 96%    Subjective Assessment - 11/10/18 1107    Subjective  Pt reporting feeling "sore all over" again today. States BP has been better the past 2 days but has not yet checked it today.    Pertinent History  Parkinson's x 5 yrs    Patient Stated Goals  "be able to join [employer] boxing class (long term); be able to take care of more things around the house"    Currently in Pain?  Yes    Pain Score  2    with movement   Pain Location  Back    Pain Orientation  Lower    Pain Descriptors / Indicators  Aching    Pain Frequency  Intermittent    Pain Score  2    Pain Location  Shoulder    Pain Orientation  Left    Pain Descriptors / Indicators  Burning    Pain Onset  --   ~1 yr   Pain Frequency  Intermittent                       [facility] Adult PT Treatment/Exercise - 11/10/18  1101      Ambulation/Gait   Ambulation Distance (Feet)  180 Feet    Assistive device  Straight cane    Gait Pattern  Step-through pattern;Trunk flexed;Decreased hip/knee flexion - right;Decreased hip/knee flexion - left;Poor foot clearance - left;Poor foot clearance - right;Decreased stride length    Gait Comments  Gait demonstrating increased cadence and stride length as well as improved foot clearance after therapeutic balance activiites and functional PWR! moves today.      High Level Balance   High Level Balance Activities  Marching forwards;Negotiating over obstacles    High Level Balance Comments  worked on high-stepping and foward and lateral stepping over obstacles to promote increased foot clearance and dedcreased shuffling of gait      Knee/Hip Exercises: Aerobic   Nustep  L5 x 6 min - UE/LE to promote reciprocal motion        PWR [facility]) - 11/10/18 1101    PWR! Step Through Forward/Back  L/R and alternating PWR! Step forward & back to neutral x 10 each; R/L alternating PWR! Step forward &  back beyond neutral x 10; B UE support on counter & cane            PT Short Term Goals - 10/17/18 1314      PT SHORT TERM GOAL #1   Title  Independent with initial HEP to address core/ LE flexibility and strengthening    Status  Achieved        PT Long Term Goals - 10/31/18 1315      PT LONG TERM GOAL #1   Title  Independent with ongoing HEP incorporating PWR! Moves as indicated    Status  Partially Met    Target Date  11/28/18      PT LONG TERM GOAL #2   Title  B LE strength >/= 4/5 to 4+/5 for improved stability    Status  Partially Met    Target Date  11/28/18      PT LONG TERM GOAL #3   Title  Patient to score <14 sec on TUG testing with LRAD to decrease risk of falls.    Status  Achieved   10/17/2018     PT LONG TERM GOAL #4   Title  Patient to score </=16 sec on 5x STS without UEs to decrease risk of falls.    Status  On-going    Target Date  11/28/18      PT LONG TERM GOAL #5   Title  Patient will demonstrate increase in gait speed to >/= 2.62 ft/sec with LRAD to promote increased safety with community ambulation    Status  Achieved   10/17/2018     PT LONG TERM GOAL #6   Title  Patient will verbalize understanding of online or community based exercise programs geared toward Parkinson's disease to promote ongoing wellness    Status  Partially Met    Target Date  11/28/18            Plan - 11/10/18 1109    Clinical Impression Statement  Larri reporting continued general malaise today with decreased energy. Continued working on reducing freezing episodes with gait with focus today on posture, increasing foot clearance and stride length utilizing PWR! moves stepping patterns as well as obstacle negotiation to increase hip and knee flexion. Patient requiring frequent seated rest breaks due to fatigue but able to demonstrate increased cadence and stride length as well as improved foot clearance by end of session although  forward flexed trunk only slightly  improved.    Comorbidities  chronic LBP; s/p cervical & lumbar laminectomies; stage 4 metastatic melanoma - currently in remission; L THR; R RTC repair; bipolar; anxiety; depression    Rehab Potential  Good    PT Frequency  2x / week    PT Duration  8 weeks    PT Treatment/Interventions  Patient/family education;Neuromuscular re-education;Therapeutic exercise;Therapeutic activities;Functional mobility training;Gait training;Stair training;Balance training;Manual techniques;Dry needling;Taping;Electrical Stimulation;Moist Heat;ADLs/Self Care Home Management    PT Next Visit Plan  Assess VS including potential orthostatic BP as indicated; strategies to address freezing of gait; bed mobility; LE stretching & core/LE strengthening; review supine, seated & standing PWR! Moves PRN; review neck stretches & postural exercises PRN    PT Home Exercise Plan  Seated & standing PWR! Moves; seated LE stretching & core/LE strengthening; neck stretches & postural exercises    Consulted and Agree with Plan of Care  Patient       Patient will benefit from skilled therapeutic intervention in order to improve the following deficits and impairments:  Decreased balance, Abnormal gait, Difficulty walking, Decreased mobility, Decreased strength, Decreased activity tolerance, Decreased safety awareness, Decreased knowledge of use of DME, Postural dysfunction, Improper body mechanics, Pain, Dizziness  Visit Diagnosis: Unsteadiness on feet  Difficulty in walking, not elsewhere classified  Muscle weakness (generalized)  Repeated falls  Chronic bilateral low back pain without sciatica     Problem List Patient Active Problem List   Diagnosis Date Noted  . Chronic midline low back pain without sciatica 12/06/2017  . Lumbar post-laminectomy syndrome 12/06/2017  . Cervical post-laminectomy syndrome 12/06/2017  . Parkinson's disease ([place]) 12/06/2017  . 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  Hyperlipemia   . Bipolar 1 disorder ([place])     Percival Spanish, PT, MPT 11/10/2018, 12:19 PM  [facility] [address]  [place], [place], [address] Phone: [phone]   Fax:  [phone]  Name: Bill Guerrero MRN: [id number] Date of Birth: 1950/10/16

## 2018-11-14 ENCOUNTER — Encounter: Payer: Self-pay | Admitting: Physical Therapy

## 2018-11-14 ENCOUNTER — Other Ambulatory Visit: Payer: Self-pay

## 2018-11-14 ENCOUNTER — Ambulatory Visit: Payer: Medicare Other | Admitting: Physical Therapy

## 2018-11-14 VITALS — BP 134/76 | HR 80

## 2018-11-14 DIAGNOSIS — R262 Difficulty in walking, not elsewhere classified: Secondary | ICD-10-CM

## 2018-11-14 DIAGNOSIS — M545 Low back pain, unspecified: Secondary | ICD-10-CM

## 2018-11-14 DIAGNOSIS — G8929 Other chronic pain: Secondary | ICD-10-CM

## 2018-11-14 DIAGNOSIS — M6281 Muscle weakness (generalized): Secondary | ICD-10-CM | POA: Diagnosis not present

## 2018-11-14 DIAGNOSIS — R296 Repeated falls: Secondary | ICD-10-CM | POA: Diagnosis not present

## 2018-11-14 DIAGNOSIS — R2681 Unsteadiness on feet: Secondary | ICD-10-CM | POA: Diagnosis not present

## 2018-11-14 NOTE — Therapy (Signed)
[facility] [address]  [place], [place], [address] Phone: [phone]   Fax:  [phone]  Physical Therapy Treatment  Patient Details  Name: RADEK CARNERO MRN: [id number] Date of Birth: [birth year] Referring Provider (PT): Alonza Bogus, MD   Encounter Date: 11/14/2018  PT End of Session - 11/14/18 1404    Visit Number  15    Number of Visits  20    Date for PT Re-Evaluation  11/28/18    Authorization Type  Medicare & AARP    PT Start Time  9977    PT Stop Time  1445    PT Time Calculation (min)  41 min    Activity Tolerance  Patient tolerated treatment well    Behavior During Therapy  [facility] for tasks assessed/performed       Past Medical History:  Diagnosis Date  . Arthritis    hips replacement  . Bipolar 1 disorder ([place])   . Hyperlipemia   . Hypertension   . 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 Multiple body piercings   . Tremor    RT HAND    Past Surgical History:  Procedure Laterality Date  . BACK SURGERY     CERVICAL AND LUMBAR  . GROIN MASS OPEN BIOPSY     MASS REMOVED FROM GROIN  . HEMORROIDECTOMY    . HERNIA REPAIR     BIL IN HERNIA  . JOINT REPLACEMENT     TOTAL LEFT HIP  . PENILE PROSTHESIS IMPLANT N/A 06/17/2012   Procedure: THREE PIECE PENILE PROTHESIS INFLATABLE-COLOPLAST (PENILE SCROTAL APPROACH);  Surgeon: Fredricka Bonine, MD;  [facility];  Service: Urology;  Laterality: N/A;  . PENILE PROSTHESIS IMPLANT N/A 10/07/2012   Procedure: PENILE PROTHESIS REMOVAL AND REPLACEMENT INFLATABLE, PENILE SCROTAL APPROACH;  Surgeon: Fredricka Bonine, MD;  [facility];  Service: Urology;  Laterality: N/A;  . PILONIDAL CYST EXCISION  1986  . ROTATOR CUFF REPAIR     RIGHT  . VASECTOMY Bilateral 06/17/2012   Procedure: VASECTOMY;  Surgeon: Fredricka Bonine, MD;  [facility];  Service:  Urology;  Laterality: Bilateral;    Vitals:   11/14/18 1408  BP: 134/76  Pulse: 80  SpO2: 97%    Subjective Assessment - 11/14/18 1409    Subjective  "Nothing different today."    Pertinent History  Parkinson's x 5 yrs    Patient Stated Goals  "be able to join [employer] boxing class (long term); be able to take care of more things around the house"    Currently in Pain?  Yes    Pain Score  2     Pain Location  Back    Pain Orientation  Lower    Pain Frequency  Intermittent    Pain Onset  --   ~1 yr        [facility] PT Assessment - 11/14/18 1404      Standardized Balance Assessment   Five times sit to stand comments   20.31 sec w/o UE assist                   [facility] Adult PT Treatment/Exercise - 11/14/18 1404      Ambulation/Gait   Ambulation/Gait Assistance Details  Provided cues for increased stride length and reciprocal arm swing initially using B hiking poles, then transitioning to no AD and finally re-introducing SPC  as patient less confident w/o AD. Pt able to demonstrate good reciprocal pattern but requires cues for upright posture and fwd gaze.     Ambulation Distance (Feet)  200 Feet   x 4   Assistive device  Straight cane;None   B hiking poles     High Level Balance   High Level Balance Activities  Side stepping    High Level Balance Comments  worked on side stepping to increase comfort with passing through narrow doorways (pt feels that his shoulders are too broad to clear bathroom doorway)      Therapeutic Activites    Other Therapeutic Activities  Simulated walking through narrow doorway (25") working on techniques to minimize freezing of gait.      Knee/Hip Exercises: Aerobic   Nustep  L5 x 6 min - UE/LE to promote reciprocal motion               PT Short Term Goals - 10/17/18 1314      PT SHORT TERM GOAL #1   Title  Independent with initial HEP to address core/ LE flexibility and strengthening    Status  Achieved        PT  Long Term Goals - 11/14/18 1410      PT LONG TERM GOAL #1   Title  Independent with ongoing HEP incorporating PWR! Moves as indicated    Status  Partially Met      PT LONG TERM GOAL #2   Title  B LE strength >/= 4/5 to 4+/5 for improved stability    Status  Partially Met      PT LONG TERM GOAL #3   Title  Patient to score <14 sec on TUG testing with LRAD to decrease risk of falls.    Status  Achieved   10/17/2018     PT LONG TERM GOAL #4   Title  Patient to score </=16 sec on 5x STS without UEs to decrease risk of falls.    Status  On-going      PT LONG TERM GOAL #5   Title  Patient will demonstrate increase in gait speed to >/= 2.62 ft/sec with LRAD to promote increased safety with community ambulation    Status  Achieved   10/17/2018     PT LONG TERM GOAL #6   Title  Patient will verbalize understanding of online or community based exercise programs geared toward Parkinson's disease to promote ongoing wellness    Status  Partially Met            Plan - 11/14/18 1411    Clinical Impression Statement  Continued treatment focus on movement patterns to reduce freezing episodes with mobility and gait targeting reciprocal arm swing with gait to promote improved rhythm of gait as well as side stepping to help with lateral mobility through narrow doorways or when negotiating around family members in his home. Ayaan able to demonstrate good reciprocal pattern but requires cues for upright posture and forward gaze as well as avoiding rotating feet/torso with side stepping.    Comorbidities  chronic LBP; s/p cervical & lumbar laminectomies; stage 4 metastatic melanoma - currently in remission; L THR; R RTC repair; bipolar; anxiety; depression    Rehab Potential  Good    PT Frequency  2x / week    PT Duration  8 weeks    PT Treatment/Interventions  Patient/family education;Neuromuscular re-education;Therapeutic exercise;Therapeutic activities;Functional mobility training;Gait  training;Stair training;Balance training;Manual techniques;Dry needling;Taping;Electrical Stimulation;Moist Heat;ADLs/Self Care Home Management  PT Next Visit Plan  Assess VS including potential orthostatic BP as indicated; strategies to address freezing of gait; bed mobility; LE stretching & core/LE strengthening; review supine, seated & standing PWR! Moves PRN; review neck stretches & postural exercises PRN    PT Home Exercise Plan  Seated & standing PWR! Moves; seated LE stretching & core/LE strengthening; neck stretches & postural exercises    Consulted and Agree with Plan of Care  Patient       Patient will benefit from skilled therapeutic intervention in order to improve the following deficits and impairments:  Decreased balance, Abnormal gait, Difficulty walking, Decreased mobility, Decreased strength, Decreased activity tolerance, Decreased safety awareness, Decreased knowledge of use of DME, Postural dysfunction, Improper body mechanics, Pain, Dizziness  Visit Diagnosis: Unsteadiness on feet  Difficulty in walking, not elsewhere classified  Muscle weakness (generalized)  Repeated falls  Chronic bilateral low back pain without sciatica     Problem List Patient Active Problem List   Diagnosis Date Noted  . Chronic midline low back pain without sciatica 12/06/2017  . Lumbar post-laminectomy syndrome 12/06/2017  . Cervical post-laminectomy syndrome 12/06/2017  . Parkinson's disease ([place]) 12/06/2017  . 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 Hyperlipemia   . Bipolar 1 disorder ([place])     Percival Spanish, PT, MPT 11/14/2018, 3:11 PM  [facility] [address]  [place], [place], [address] Phone: [phone]   Fax:  [phone]  Name: FRANCISZEK PLATTEN MRN: [id number] Date of Birth: March 01, 1950

## 2018-11-17 ENCOUNTER — Encounter: Payer: Self-pay | Admitting: Physical Therapy

## 2018-11-17 ENCOUNTER — Ambulatory Visit: Payer: Medicare Other | Admitting: Physical Therapy

## 2018-11-17 ENCOUNTER — Other Ambulatory Visit: Payer: Self-pay

## 2018-11-17 VITALS — BP 160/84 | HR 80

## 2018-11-17 DIAGNOSIS — M6281 Muscle weakness (generalized): Secondary | ICD-10-CM

## 2018-11-17 DIAGNOSIS — R296 Repeated falls: Secondary | ICD-10-CM

## 2018-11-17 DIAGNOSIS — R2681 Unsteadiness on feet: Secondary | ICD-10-CM

## 2018-11-17 DIAGNOSIS — R262 Difficulty in walking, not elsewhere classified: Secondary | ICD-10-CM | POA: Diagnosis not present

## 2018-11-17 DIAGNOSIS — M545 Low back pain, unspecified: Secondary | ICD-10-CM

## 2018-11-17 DIAGNOSIS — G8929 Other chronic pain: Secondary | ICD-10-CM | POA: Diagnosis not present

## 2018-11-17 NOTE — Therapy (Signed)
[facility] [place] [address]  [place], [place], [address] Phone: [phone]   Fax:  [phone]  Physical Therapy Treatment  Patient Details  Name: Bill Guerrero MRN: [id number] Date of Birth: March 27, 1950 Referring Provider (PT): Alonza Bogus, MD   Encounter Date: 11/17/2018  PT End of Session - 11/17/18 1401    Visit Number  16    Number of Visits  20    Date for PT Re-Evaluation  11/28/18    Authorization Type  Medicare ([place]) & [employer]ARP    PT Start Time  1401    PT Stop Time  1446    PT Time Calculation (min)  45 min    Activity Tolerance  Patient tolerated treatment well    Behavior During Therapy  [facility] for tasks assessed/performed       Past Medical History:  Diagnosis Date  . Arthritis    hips replacement  . Bipolar 1 disorder ([place])   . Hyperlipemia   . Hypertension   . Infection of prosthesis ([place])    penile implant with abscess with drainage of scrotum -"pinkish tan""no odor"  . Multiple body piercings   . Tremor    RT HAND    Past Surgical History:  Procedure Laterality Date  . BACK SURGERY     CERVICAL AND LUMBAR  . GROIN MASS OPEN BIOPSY     MASS REMOVED FROM GROIN  . HEMORROIDECTOMY    . HERNIA REPAIR     BIL IN HERNIA  . JOINT REPLACEMENT     TOTAL LEFT HIP  . PENILE PROSTHESIS IMPLANT N/A 06/17/2012   Procedure: THREE PIECE PENILE PROTHESIS INFLATABLE-COLOPLAST (PENILE SCROTAL APPROACH);  Surgeon: Fredricka Bonine, MD;  Location: [facility];  Service: Urology;  Laterality: N/A;  . PENILE PROSTHESIS IMPLANT N/A 10/07/2012   Procedure: PENILE PROTHESIS REMOVAL AND REPLACEMENT INFLATABLE, PENILE SCROTAL APPROACH;  Surgeon: Fredricka Bonine, MD;  Location: [facility];  Service: Urology;  Laterality: N/A;  . PILONIDAL CYST EXCISION  1986  . ROTATOR CUFF REPAIR     RIGHT  . VASECTOMY Bilateral 06/17/2012   Procedure: VASECTOMY;  Surgeon: Fredricka Bonine, MD;  Location: [facility];   Service: Urology;  Laterality: Bilateral;    Vitals:   11/17/18 1404 11/17/18 1414  BP: (!) 162/88 (!) 160/84  Pulse: 80   SpO2: 97%     Subjective Assessment - 11/17/18 1406    Subjective  Pt reporting he didn't sleep last night so ended up sleeping for a few hours this morning. Notes "all over ache" yesterday worse than today.    Pertinent History  Parkinson's x 5 yrs    Patient Stated Goals  "be able to join [employer] boxing class (long term); be able to take care of more things around the house"    Currently in Pain?  Yes    Pain Score  2    1-2/10   Pain Location  Back    Pain Orientation  Lower    Pain Descriptors / Indicators  Aching    Pain Type  Chronic pain    Pain Frequency  Intermittent    Pain Onset  --   ~1 yr                      [facility] Adult PT Treatment/Exercise - 11/17/18 1401      High Level Balance   High Level Balance Activities  Marching forwards;Negotiating over obstacles  High Level Balance Comments  worked on high-stepping and foward and lateral stepping over obstacles to promote increased foot clearance and decreased shuffling/freezing of gait      Knee/Hip Exercises: Aerobic   Nustep  L5 x 6 min - UE/LE to promote reciprocal motion      Knee/Hip Exercises: Standing   Hip Flexion  Right;Left;10 reps;Stengthening;Knee bent    Hip Flexion Limitations  looped yellow TB at ankles; UE support on cane and counter    Hip Abduction  Right;Left;10 reps;Knee straight;Stengthening    Abduction Limitations  looped yellow TB at ankles; UE support on counter    Hip Extension  Right;Left;10 reps;Knee straight;Stengthening    Extension Limitations  looped yellow TB at ankles; UE support on counter             PT Education - 11/17/18 1442    Education Details  HEP progression/update - standing hip strengtening with yellow TB    Person(s) Educated  Patient    Methods  Explanation;Demonstration;Handout    Comprehension  Verbalized  understanding;Returned demonstration       PT Short Term Goals - 10/17/18 1314      PT SHORT TERM GOAL #1   Title  Independent with initial HEP to address core/ LE flexibility and strengthening    Status  Achieved        PT Long Term Goals - 11/14/18 1410      PT LONG TERM GOAL #1   Title  Independent with ongoing HEP incorporating PWR! Moves as indicated    Status  Partially Met      PT LONG TERM GOAL #2   Title  B LE strength >/= 4/5 to 4+/5 for improved stability    Status  Partially Met      PT LONG TERM GOAL #3   Title  Patient to score <14 sec on TUG testing with LRAD to decrease risk of falls.    Status  Achieved   10/17/2018     PT LONG TERM GOAL #4   Title  Patient to score </=16 sec on 5x STS without UEs to decrease risk of falls.    Status  On-going      PT LONG TERM GOAL #5   Title  Patient will demonstrate increase in gait speed to >/= 2.62 ft/sec with LRAD to promote increased safety with community ambulation    Status  Achieved   10/17/2018     PT LONG TERM GOAL #6   Title  Patient will verbalize understanding of online or community based exercise programs geared toward Parkinson's disease to promote ongoing wellness    Status  Partially Met            Plan - 11/17/18 1409    Clinical Impression Statement  Bill Guerrero reporting "all over achy" feeling today with poor sleep last night and BP moderately elevated - increased "shakiness" also noted which patient states is indicative of when he has missed a dose of meds but states his records indicate that he took his meds. When inquiring about need for review of HEP, patient admitting to preference for band exercises, therefore initial portion of treatment focusing on instruction in progression to standing theraband resisted exercises with HEP updated accordingly and patient instructed to alternate new exercises with prior seated LE band exercises to start. Remainder of session continuing emphasis on movement  patterns promoting increased hip and knee flexion for increased foot clearance and decreased shuffling/freezing of gait. Patient fatiguing more quickly  today, requiring increased seated rest breaks but otherwise tolerated all activities well and able to demonstrate more consistent foot clearance and placement with stepping over obstacles.    Comorbidities  chronic LBP; s/p cervical & lumbar laminectomies; stage 4 metastatic melanoma - currently in remission; L THR; R RTC repair; bipolar; anxiety; depression    Rehab Potential  Good    PT Frequency  2x / week    PT Duration  8 weeks    PT Treatment/Interventions  Patient/family education;Neuromuscular re-education;Therapeutic exercise;Therapeutic activities;Functional mobility training;Gait training;Stair training;Balance training;Manual techniques;Dry needling;Taping;Electrical Stimulation;Moist Heat;ADLs/Self Care Home Management    PT Next Visit Plan  Assess VS including potential orthostatic BP as indicated; strategies to address freezing of gait; bed mobility; LE stretching & core/LE strengthening; review supine, seated & standing PWR! Moves PRN; review neck stretches & postural exercises PRN    PT Home Exercise Plan  Seated & standing PWR! Moves; seated LE stretching & core/LE strengthening; neck stretches & postural exercises    Consulted and Agree with Plan of Care  Patient       Patient will benefit from skilled therapeutic intervention in order to improve the following deficits and impairments:  Decreased balance, Abnormal gait, Difficulty walking, Decreased mobility, Decreased strength, Decreased activity tolerance, Decreased safety awareness, Decreased knowledge of use of DME, Postural dysfunction, Improper body mechanics, Pain, Dizziness  Visit Diagnosis: Unsteadiness on feet  Difficulty in walking, not elsewhere classified  Muscle weakness (generalized)  Repeated falls  Chronic bilateral low back pain without  sciatica     Problem List Patient Active Problem List   Diagnosis Date Noted  . Chronic midline low back pain without sciatica 12/06/2017  . Lumbar post-laminectomy syndrome 12/06/2017  . Cervical post-laminectomy syndrome 12/06/2017  . Parkinson's disease ([place]) 12/06/2017  . 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 Hyperlipemia   . Bipolar 1 disorder ([place])     Percival Spanish, PT, MPT 11/17/2018, 6:37 PM  [facility] [address]  [place], [place], [address] Phone: [phone]   Fax:  [phone]  Name: Bill Guerrero MRN: [id number] Date of Birth: [birth year]

## 2018-11-17 NOTE — Patient Instructions (Signed)
    Home exercise program created by Abass Misener, PT.  For questions, please contact Miachel Nardelli via phone at [phone] or email at [email or web address]  [facility] [place] [address]  [address] [place], [place], [address] Phone: [phone]   Fax:  [phone]    

## 2018-11-21 ENCOUNTER — Ambulatory Visit: Payer: Medicare Other | Admitting: Physical Therapy

## 2018-11-24 ENCOUNTER — Ambulatory Visit: Payer: Medicare Other | Admitting: Physical Therapy

## 2018-11-24 ENCOUNTER — Other Ambulatory Visit: Payer: Self-pay

## 2018-11-24 VITALS — BP 120/58 | HR 76

## 2018-11-24 DIAGNOSIS — G8929 Other chronic pain: Secondary | ICD-10-CM | POA: Diagnosis not present

## 2018-11-24 DIAGNOSIS — M6281 Muscle weakness (generalized): Secondary | ICD-10-CM | POA: Diagnosis not present

## 2018-11-24 DIAGNOSIS — M545 Low back pain: Secondary | ICD-10-CM | POA: Diagnosis not present

## 2018-11-24 DIAGNOSIS — R2681 Unsteadiness on feet: Secondary | ICD-10-CM | POA: Diagnosis not present

## 2018-11-24 DIAGNOSIS — R262 Difficulty in walking, not elsewhere classified: Secondary | ICD-10-CM

## 2018-11-24 DIAGNOSIS — R296 Repeated falls: Secondary | ICD-10-CM | POA: Diagnosis not present

## 2018-11-24 NOTE — Therapy (Signed)
[facility] [address]  [place], [place], [address] Phone: [phone]   Fax:  [phone]  Physical Therapy Treatment  Patient Details  Name: Bill Guerrero MRN: [id number] Date of Birth: March 12, 1950 Referring Provider (PT): Alonza Bogus, MD   Encounter Date: 11/24/2018  PT End of Session - 11/24/18 1104    Visit Number  17    Number of Visits  20    Date for PT Re-Evaluation  11/28/18    Authorization Type  Medicare ([place]) & AARP    PT Start Time  1104    PT Stop Time  1156    PT Time Calculation (min)  52 min    Activity Tolerance  Patient tolerated treatment well    Behavior During Therapy  [facility] for tasks assessed/performed       Past Medical History:  Diagnosis Date  . Arthritis    hips replacement  . Bipolar 1 disorder ([place])   . Hyperlipemia   . Hypertension   . 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 Multiple body piercings   . Tremor    RT HAND    Past Surgical History:  Procedure Laterality Date  . BACK SURGERY     CERVICAL AND LUMBAR  . GROIN MASS OPEN BIOPSY     MASS REMOVED FROM GROIN  . HEMORROIDECTOMY    . HERNIA REPAIR     BIL IN HERNIA  . JOINT REPLACEMENT     TOTAL LEFT HIP  . PENILE PROSTHESIS IMPLANT N/A 06/17/2012   Procedure: THREE PIECE PENILE PROTHESIS INFLATABLE-COLOPLAST (PENILE SCROTAL APPROACH);  Surgeon: Fredricka Bonine, MD;  [facility];  Service: Urology;  Laterality: N/A;  . PENILE PROSTHESIS IMPLANT N/A 10/07/2012   Procedure: PENILE PROTHESIS REMOVAL AND REPLACEMENT INFLATABLE, PENILE SCROTAL APPROACH;  Surgeon: Fredricka Bonine, MD;  Location: [facility];  Service: Urology;  Laterality: N/A;  . PILONIDAL CYST EXCISION  1986  . ROTATOR CUFF REPAIR     RIGHT  . VASECTOMY Bilateral 06/17/2012   Procedure: VASECTOMY;  Surgeon: Fredricka Bonine, MD;  Location: [facility];   Service: Urology;  Laterality: Bilateral;    Vitals:   11/24/18 1110  BP: (!) 120/58  Pulse: 76  SpO2: 96%    Subjective Assessment - 11/24/18 1112    Subjective  Pt reporting he missed last session due to his wife seemingly having a depressive epsiode (crying non-stop) and waiting on the MD. Having a bad day today - hurting all over (esp back) and feeling weaker, having a hard time standing up straight.    Pertinent History  Parkinson's x 5 yrs    Patient Stated Goals  "be able to join [employer] boxing class (long term); be able to take care of more things around the house"    Currently in Pain?  Yes    Pain Score  4     Pain Location  Back    Pain Orientation  Lower;Mid    Pain Descriptors / Indicators  Burning;Tightness    Pain Type  Chronic pain    Pain Frequency  Intermittent    Pain Onset  --   ~1 yr                      [facility] Adult PT Treatment/Exercise - 11/24/18 1104      Lumbar Exercises: Stretches  Quadruped Mid Back Stretch  30 seconds;3 reps    Quadruped Mid Back Stretch Limitations  3-way seated prayer stretch with hands resting on rollator seat      Knee/Hip Exercises: Aerobic   Nustep  L5 x 6 min - UE/LE to promote reciprocal motion      Knee/Hip Exercises: Seated   Ball Squeeze  10 x 5 sec   pillow   Clamshell with TheraBand  Green   alt hip ABD/ER x 10   Marching  Both;10 reps;Strengthening    Marching Limitations  green TB      Shoulder Exercises: Seated   Row  Both;10 reps;Theraband;Strengthening    Theraband Level (Shoulder Row)  Level 2 (Red)    Row Limitations  cues to tuck elbows into ribs focusing on scap retraction & avoiding excessive shoulder extension or upward rotation of elbows    Horizontal ABduction  Both;10 reps;Theraband;Strengthening    Theraband Level (Shoulder Horizontal ABduction)  Level 2 (Red)    Horizontal ABduction Limitations  cues for upright posture & scap retraction    External Rotation  Both;10  reps;Theraband;Strengthening    Theraband Level (Shoulder External Rotation)  Level 2 (Red)    External Rotation Limitations  cues for upright posture & scap retraction    Diagonals  Both;10 reps;Theraband;Strengthening    Theraband Level (Shoulder Diagonals)  Level 2 (Red)    Diagonals Limitations  cues for upright posture & scap retraction               PT Short Term Goals - 10/17/18 1314      PT SHORT TERM GOAL #1   Title  Independent with initial HEP to address core/ LE flexibility and strengthening    Status  Achieved        PT Long Term Goals - 11/14/18 1410      PT LONG TERM GOAL #1   Title  Independent with ongoing HEP incorporating PWR! Moves as indicated    Status  Partially Met      PT LONG TERM GOAL #2   Title  B LE strength >/= 4/5 to 4+/5 for improved stability    Status  Partially Met      PT LONG TERM GOAL #3   Title  Patient to score <14 sec on TUG testing with LRAD to decrease risk of falls.    Status  Achieved   10/17/2018     PT LONG TERM GOAL #4   Title  Patient to score </=16 sec on 5x STS without UEs to decrease risk of falls.    Status  On-going      PT LONG TERM GOAL #5   Title  Patient will demonstrate increase in gait speed to >/= 2.62 ft/sec with LRAD to promote increased safety with community ambulation    Status  Achieved   10/17/2018     PT LONG TERM GOAL #6   Title  Patient will verbalize understanding of online or community based exercise programs geared toward Parkinson's disease to promote ongoing wellness    Status  Partially Met            Plan - 11/24/18 1117    Clinical Impression Statement  Bill Guerrero appears more stressed due to family issues at home (wife battling depression) and reports increased pain and general malaise necessitating use of rollator today in place of [facility]. Only two visits remaining in current POC (patient missed last visit due to issues with wife), therefore targeted review and  update of HEP in  preparation for upcoming transition to HEP. Focused on seated theraband resisted exercises as patient noting preference and increased tendency to focus on these at home, reinforcing upright posture during exercises and discussing progression of theraband resistance from yellow to red for UE exercises and to green TB for seated proximal LE exercises, with appropriate bands provided. Unable to complete full review of HEP due to time constraints, therefore will address remaining exercises on next visit in additional to functional/goal assessment and will determine readiness for transition to HEP vs need for recert.    Personal Factors and Comorbidities  Comorbidity 3+;Time since onset of injury/illness/exacerbation;Fitness;Past/Current Experience;Transportation    Comorbidities  chronic LBP; s/p cervical & lumbar laminectomies; stage 4 metastatic melanoma - currently in remission; L THR; R RTC repair; bipolar; anxiety; depression    Rehab Potential  Good    PT Frequency  2x / week    PT Duration  8 weeks    PT Treatment/Interventions  Patient/family education;Neuromuscular re-education;Therapeutic exercise;Therapeutic activities;Functional mobility training;Gait training;Stair training;Balance training;Manual techniques;Dry needling;Taping;Electrical Stimulation;Moist Heat;ADLs/Self Care Home Management    PT Next Visit Plan  Assess VS including potential orthostatic BP as indicated; strategies to address freezing of gait; bed mobility; LE stretching & core/LE strengthening; review supine, seated & standing PWR! Moves PRN; review neck stretches & postural exercises PRN    PT Home Exercise Plan  Seated & standing PWR! Moves; seated LE stretching & core/LE strengthening; neck stretches & postural exercises    Consulted and Agree with Plan of Care  Patient       Patient will benefit from skilled therapeutic intervention in order to improve the following deficits and impairments:  Decreased balance, Abnormal  gait, Difficulty walking, Decreased mobility, Decreased strength, Decreased activity tolerance, Decreased safety awareness, Decreased knowledge of use of DME, Postural dysfunction, Improper body mechanics, Pain, Dizziness  Visit Diagnosis: Unsteadiness on feet  Difficulty in walking, not elsewhere classified  Muscle weakness (generalized)  Repeated falls     Problem List Patient Active Problem List   Diagnosis Date Noted  . Chronic midline low back pain without sciatica 12/06/2017  . Lumbar post-laminectomy syndrome 12/06/2017  . Cervical post-laminectomy syndrome 12/06/2017  . Parkinson's disease ([place]) 12/06/2017  . 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 Hyperlipemia   . Bipolar 1 disorder ([place])     Percival Spanish, PT, MPT 11/24/2018, 1:04 PM  [facility] [address]  [place], [place], [address] Phone: [phone]   Fax:  [phone]  Name: Bill Guerrero MRN: [id number] Date of Birth: 1950-10-01

## 2018-11-28 ENCOUNTER — Other Ambulatory Visit: Payer: Self-pay

## 2018-11-28 ENCOUNTER — Encounter: Payer: Self-pay | Admitting: Physical Therapy

## 2018-11-28 ENCOUNTER — Ambulatory Visit: Payer: Medicare Other | Admitting: Physical Therapy

## 2018-11-28 VITALS — BP 148/78 | HR 73

## 2018-11-28 DIAGNOSIS — R262 Difficulty in walking, not elsewhere classified: Secondary | ICD-10-CM

## 2018-11-28 DIAGNOSIS — M545 Low back pain, unspecified: Secondary | ICD-10-CM

## 2018-11-28 DIAGNOSIS — G8929 Other chronic pain: Secondary | ICD-10-CM | POA: Diagnosis not present

## 2018-11-28 DIAGNOSIS — R296 Repeated falls: Secondary | ICD-10-CM | POA: Diagnosis not present

## 2018-11-28 DIAGNOSIS — R2681 Unsteadiness on feet: Secondary | ICD-10-CM | POA: Diagnosis not present

## 2018-11-28 DIAGNOSIS — M6281 Muscle weakness (generalized): Secondary | ICD-10-CM

## 2018-11-28 NOTE — Therapy (Addendum)
[facility] [address]  [place], [place], [address] Phone: [phone]   Fax:  [phone]  Physical Therapy Treatment / Discharge Summary  Patient Details  Name: Bill Guerrero MRN: [id number] Date of Birth: [birth year] Referring Provider (PT): Alonza Bogus, MD   Progress Note  Reporting Period 10/17/2018 to 11/28/2018  See note below for Objective Data and Assessment of Progress/Goals.     Encounter Date: 11/28/2018  PT End of Session - 11/28/18 1356    Visit Number  18    Number of Visits  20    Date for PT Re-Evaluation  11/28/18    Authorization Type  Medicare (KX - if he needs to return) & AARP    PT Start Time  1356    PT Stop Time  1446    PT Time Calculation (min)  50 min    Activity Tolerance  Patient tolerated treatment well    Behavior During Therapy  WFL for tasks assessed/performed       Past Medical History:  Diagnosis Date  . Arthritis    hips replacement  . Bipolar 1 disorder ([place])   . Hyperlipemia   . Hypertension   . Infection of prosthesis ([place])    penile implant with abscess with drainage of scrotum -"pinkish tan""no odor"  . Multiple body piercings   . Tremor    RT HAND    Past Surgical History:  Procedure Laterality Date  . BACK SURGERY     CERVICAL AND LUMBAR  . GROIN MASS OPEN BIOPSY     MASS REMOVED FROM GROIN  . HEMORROIDECTOMY    . HERNIA REPAIR     BIL IN HERNIA  . JOINT REPLACEMENT     TOTAL LEFT HIP  . PENILE PROSTHESIS IMPLANT N/A 06/17/2012   Procedure: THREE PIECE PENILE PROTHESIS INFLATABLE-COLOPLAST (PENILE SCROTAL APPROACH);  Surgeon: Fredricka Bonine, MD;  Location: [facility];  Service: Urology;  Laterality: N/A;  . PENILE PROSTHESIS IMPLANT N/A 10/07/2012   Procedure: PENILE PROTHESIS REMOVAL AND REPLACEMENT INFLATABLE, PENILE SCROTAL APPROACH;  Surgeon: Fredricka Bonine, MD;  Location: [facility];  Service: Urology;  Laterality: N/A;  .  PILONIDAL CYST EXCISION  1986  . ROTATOR CUFF REPAIR     RIGHT  . VASECTOMY Bilateral 06/17/2012   Procedure: VASECTOMY;  Surgeon: Fredricka Bonine, MD;  Location: [facility];  Service: Urology;  Laterality: Bilateral;    Vitals:   11/28/18 1400  BP: (!) 148/78  Pulse: 73  SpO2: 96%    Subjective Assessment - 11/28/18 1401    Subjective  Pt reporting he has having another "bad today" - "feels like it's been a week of bad days" - reports normally "bad days" only occur 1-2 days at a time.    Pertinent History  Parkinson's x 5 yrs    Patient Stated Goals  "be able to join [employer] boxing class (long term); be able to take care of more things around the house"    Currently in Pain?  Yes    Pain Score  3     Pain Location  Back    Pain Orientation  Lower    Pain Descriptors / Indicators  Burning;Tightness    Pain Type  Chronic pain    Pain Onset  --   ~1 yr        [facility] PT Assessment - 11/28/18 1356      Assessment   Medical Diagnosis  Parkinson's disease  Referring Provider (PT)  Alonza Bogus, MD    Onset Date/Surgical Date  --   ~5 yrs ago   Next MD Visit  02/28/2019      Strength   Right Hip Flexion  5/5    Right Hip Extension  4/5    Right Hip External Rotation   4+/5    Right Hip Internal Rotation  5/5    Right Hip ABduction  4+/5    Right Hip ADduction  4+/5    Left Hip Flexion  4/5    Left Hip Extension  4/5    Left Hip External Rotation  4/5    Left Hip Internal Rotation  4+/5    Left Hip ABduction  4+/5    Left Hip ADduction  4+/5    Right Knee Flexion  5/5    Right Knee Extension  5/5    Left Knee Flexion  5/5    Left Knee Extension  5/5    Right Ankle Dorsiflexion  5/5    Right Ankle Plantar Flexion  5/5    Left Ankle Dorsiflexion  5/5    Left Ankle Plantar Flexion  4+/5      Ambulation/Gait   Assistive device  Straight cane;None    Gait Pattern  Step-through pattern;Trunk flexed    Ambulation Surface  Level;Indoor    Gait velocity  3.40  ft/sec with SPC      Standardized Balance Assessment   Five times sit to stand comments   16.59 sec w/o UE assist    10 Meter Walk  9.66 sec with SPC      Berg Balance Test   Sit to Stand  Able to stand without using hands and stabilize independently    Standing Unsupported  Able to stand safely 2 minutes    Sitting with Back Unsupported but Feet Supported on Floor or Stool  Able to sit safely and securely 2 minutes    Stand to Sit  Sits safely with minimal use of hands    Transfers  Able to transfer safely, minor use of hands    Standing Unsupported with Eyes Closed  Able to stand 10 seconds safely    Standing Unsupported with Feet Together  Able to place feet together independently and stand 1 minute safely    From Standing, Reach Forward with Outstretched Arm  Can reach confidently >25 cm (10")    From Standing Position, Pick up Object from Floor  Able to pick up shoe safely and easily    From Standing Position, Turn to Look Behind Over each Shoulder  Looks behind one side only/other side shows less weight shift    Turn 360 Degrees  Able to turn 360 degrees safely but slowly    Standing Unsupported, Alternately Place Feet on Step/Stool  Able to stand independently and safely and complete 8 steps in 20 seconds    Standing Unsupported, One Foot in Front  Able to plae foot ahead of the other independently and hold 30 seconds    Standing on One Leg  Able to lift leg independently and hold 5-10 seconds    Total Score  51    Berg comment:  46-51 moderate fall risk (>50%)      Dynamic Gait Index   Level Surface  Mild Impairment    Change in Gait Speed  Mild Impairment    Gait with Horizontal Head Turns  Normal    Gait with Vertical Head Turns  Mild Impairment  Gait and Pivot Turn  Mild Impairment    Step Over Obstacle  Mild Impairment    Step Around Obstacles  Normal    Steps  Moderate Impairment    Total Score  17    DGI comment:  Scores of 19 or less are predictive of falls in older  community living adults      Timed Up and Go Test   Normal TUG (seconds)  11.4   with SPC                  [facility] Adult PT Treatment/Exercise - 11/28/18 1356      Knee/Hip Exercises: Aerobic   Nustep  L5 x 6 min - UE/LE to promote reciprocal motion               PT Short Term Goals - 10/17/18 1314      PT SHORT TERM GOAL #1   Title  Independent with initial HEP to address core/ LE flexibility and strengthening    Status  Achieved        PT Long Term Goals - 11/28/18 1404      PT LONG TERM GOAL #1   Title  Independent with ongoing HEP incorporating PWR! Moves as indicated    Status  Achieved      PT LONG TERM GOAL #2   Title  B LE strength >/= 4/5 to 4+/5 for improved stability    Status  Achieved      PT LONG TERM GOAL #3   Title  Patient to score <14 sec on TUG testing with LRAD to decrease risk of falls.    Status  Achieved   10/17/2018     PT LONG TERM GOAL #4   Title  Patient to score </=16 sec on 5x STS without UEs to decrease risk of falls.    Status  Achieved      PT LONG TERM GOAL #5   Title  Patient will demonstrate increase in gait speed to >/= 2.62 ft/sec with LRAD to promote increased safety with community ambulation    Status  Achieved   10/17/2018     PT LONG TERM GOAL #6   Title  Patient will verbalize understanding of online or community based exercise programs geared toward Parkinson's disease to promote ongoing wellness    Status  Achieved            Plan - 11/28/18 1404    Clinical Impression Statement  Demetrick has demonstrated excellent progress with PT, with all goals for current episode now met. Significant gains noted on all standardized testing: gait speed essentially doubled from 1.76 ft/sec to 3.40 ft/sec with SPC, TUG time reduced from 21.6 sec to 11.4 sec with SPC, 5x STS reduced to 16.59 sec from 25.87 sec w/o UE assist (22.35 sec with UE push from armrests on initial assessment), Berg increased from 34/56 to  51/56 and DGI improved from 10/24 to 17/24. Overall LE strength now 4/5 or better. He is independent with and ongoing HEP and verbalizes understanding of online resources for continued activity as well as community resources for senior and Parkinson's focused exercise groups once COVID-19 situation allows for resumption of these activities. Despite above gains, Honorio verbalizing some hesitation with making the transition to the HEP given reports of several "bad days" recently including today, therefore will place him on hold for 30 days. He is also scheduled for a 6 month return visit for the Parkinson's screen.  Personal Factors and Comorbidities  Comorbidity 3+;Time since onset of injury/illness/exacerbation;Fitness;Past/Current Experience;Transportation    Comorbidities  chronic LBP; s/p cervical & lumbar laminectomies; stage 4 metastatic melanoma - currently in remission; L THR; R RTC repair; bipolar; anxiety; depression    Rehab Potential  Good    PT Frequency  2x / week    PT Duration  8 weeks    PT Treatment/Interventions  Patient/family education;Neuromuscular re-education;Therapeutic exercise;Therapeutic activities;Functional mobility training;Gait training;Stair training;Balance training;Manual techniques;Dry needling;Taping;Electrical Stimulation;Moist Heat;ADLs/Self Care Home Management    PT Next Visit Plan  30-day hold; 6 month Parkinson's screen    PT Home Exercise Plan  Seated & standing PWR! Moves; seated LE stretching & core/LE strengthening; neck stretches & postural exercises; standing hip strengthening with yellow TB    Consulted and Agree with Plan of Care  Patient       Patient will benefit from skilled therapeutic intervention in order to improve the following deficits and impairments:  Decreased balance, Abnormal gait, Difficulty walking, Decreased mobility, Decreased strength, Decreased activity tolerance, Decreased safety awareness, Decreased knowledge of use of DME,  Postural dysfunction, Improper body mechanics, Pain, Dizziness  Visit Diagnosis: Unsteadiness on feet  Difficulty in walking, not elsewhere classified  Muscle weakness (generalized)  Repeated falls  Chronic bilateral low back pain without sciatica     Problem List Patient Active Problem List   Diagnosis Date Noted  . Chronic midline low back pain without sciatica 12/06/2017  . Lumbar post-laminectomy syndrome 12/06/2017  . Cervical post-laminectomy syndrome 12/06/2017  . Parkinson's disease ([place]) 12/06/2017  . 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 Hyperlipemia   . Bipolar 1 disorder ([place])     Percival Spanish, PT, MPT 11/28/2018, 5:55 PM  [facility] [address]  [place], [place], [address] Phone: [phone]   Fax:  [phone]  Name: JUANA MONTINI MRN: [id number] Date of Birth: [birth year]    PHYSICAL THERAPY DISCHARGE SUMMARY  Visits from Start of Care: 18  Current functional level related to goals / functional outcomes:   Refer to above clinical impression for status as of last visit on 11/28/2018. Patient was placed on hold for 30 days and has not needed to return to PT, therefore will proceed with discharge from PT for this episode.   Remaining deficits:   As above. Riyansh is scheduled to return for a Parkinson's screen on 05/29/2019.   Education / Equipment:   HEP, PWR! Moves, community based exercise programs & Parkinson's resources  Plan: Patient agrees to discharge.  Patient goals were met. Patient is being discharged due to meeting the stated rehab goals.  ?????     Percival Spanish, PT, MPT 01/11/19, 8:55 AM  [facility] [address]  [place], [place], [address] Phone: [phone]   Fax:  [phone]

## 2018-12-01 DIAGNOSIS — C438 Malignant melanoma of overlapping sites of skin: Secondary | ICD-10-CM | POA: Diagnosis not present

## 2018-12-01 DIAGNOSIS — C4371 Malignant melanoma of right lower limb, including hip: Secondary | ICD-10-CM | POA: Diagnosis not present

## 2018-12-01 DIAGNOSIS — B379 Candidiasis, unspecified: Secondary | ICD-10-CM | POA: Diagnosis not present

## 2018-12-01 DIAGNOSIS — M7989 Other specified soft tissue disorders: Secondary | ICD-10-CM | POA: Diagnosis not present

## 2018-12-01 DIAGNOSIS — Z79899 Other long term (current) drug therapy: Secondary | ICD-10-CM | POA: Diagnosis not present

## 2018-12-01 DIAGNOSIS — G20 Parkinson's disease: Secondary | ICD-10-CM | POA: Diagnosis not present

## 2018-12-15 ENCOUNTER — Telehealth: Payer: Self-pay | Admitting: Neurology

## 2018-12-15 NOTE — Telephone Encounter (Signed)
Please advise 

## 2018-12-15 NOTE — Telephone Encounter (Signed)
Added the amantadine medication last time but he stopped taking it because of shaking. Now he is shaking the same way and she was wanting to know what she could do for the shaking. Thanks!

## 2018-12-15 NOTE — Telephone Encounter (Signed)
1.  She may be, but I don't see that spouse is on DPR 2.  I added it for tremor and he stopped it because he was shaking??  I think i'm confused?

## 2018-12-15 NOTE — Telephone Encounter (Signed)
Wife on DPR, Left message to call office back

## 2018-12-15 NOTE — Telephone Encounter (Signed)
Spoke with wife and she is aware of message below. She stated he is going to try the medication again, I advised a 14 day trial and then let us know how he is doing and if he still wants to discontinue the medication. Still almost a whole bottle at his home from the original RX.

## 2018-12-15 NOTE — Telephone Encounter (Signed)
Patient stated that the medication made his tremors worse so he had stopped it and now he would like to try something else for the tremors?

## 2018-12-15 NOTE — Telephone Encounter (Signed)
Unfortunately, I don't have other options for him that won't cause cognitive change.  That medication doesn't make tremor worse, however

## 2019-01-24 ENCOUNTER — Encounter: Payer: Self-pay | Admitting: Neurology

## 2019-02-07 ENCOUNTER — Telehealth: Payer: Self-pay | Admitting: Psychiatry

## 2019-02-07 ENCOUNTER — Other Ambulatory Visit: Payer: Self-pay | Admitting: Psychiatry

## 2019-02-07 MED ORDER — QUETIAPINE FUMARATE ER 300 MG PO TB24: 300.0000 mg | ORAL_TABLET | Freq: Every day | ORAL | 0 refills | Status: DC

## 2019-02-07 NOTE — Telephone Encounter (Signed)
Rx sent 

## 2019-02-07 NOTE — Telephone Encounter (Signed)
Pt wife called to advise mail order for seroquel has not arrived. Has been on back order. Pt started getting sick from not sleeping for 5 days. Please send 30 day Rx to local [place] [facility] on file. Next appt 3/15

## 2019-02-24 ENCOUNTER — Other Ambulatory Visit: Payer: Self-pay | Admitting: Neurology

## 2019-02-24 NOTE — Progress Notes (Deleted)
Virtual Visit via Video Note The purpose of this virtual visit is to provide medical care while limiting exposure to the novel coronavirus.    Consent was obtained for video visit:  {yes no:314532} Answered questions that patient had about telehealth interaction:  {yes no:314532} I discussed the limitations, risks, security and privacy concerns of performing an evaluation and management service by telemedicine. I also discussed with the patient that there may be a patient responsible charge related to this service. The patient expressed understanding and agreed to proceed.  Pt location: Home Physician Location: office Name of referring provider:  Burnard Bunting, MD I connected with Bill Guerrero at patients initiation/request on 02/28/2019 at 11:15 AM EST by video enabled telemedicine application and verified that I am speaking with the correct person using two identifiers. Pt MRN:  [id number] Pt DOB:  September 22, 1950 Video Participants:  Bill Freud Mehan;  ***   History of Present Illness: *** Patient seen today in follow-up for parkinsonism.  Amantadine was added last visit.  I got a call from his wife in November that the patient stopped the amantadine because he was shaking.  She was calling to ask what she could do for the shaking.  I expressed my confusion, as I added it for the tremor, but then he stopped it because he was shaking.  He thought it made tremor worse, but wife stated that he was going to try it again.  He reports that ***.  Pt denies falls.  Pt denies lightheadedness, near syncope.  No hallucinations.  Medical records are reviewed since our last visit.  Patient has been to physical therapy.  Patient also has followed up with Dr. Clovis Pu.  Current movement d/o meds:  ***Pramipexole 0.5 mg 3 times per day Carbidopa/levodopa 25/100, 1.5 tablets 4 times per day Amantadine 100 mg twice per day   Current Outpatient Medications on File Prior to Visit  Medication Sig  Dispense Refill  . amantadine (SYMMETREL) 100 MG capsule Take 1 capsule (100 mg total) by mouth 2 (two) times daily. (Patient not taking: Reported on 10/18/2018) 180 capsule 1  . Ascorbic Acid (VITAMIN C) 1000 MG tablet Take 1,000 mg by mouth 2 (two) times daily.     . B Complex-C (B-COMPLEX WITH VITAMIN C) tablet Take 1 tablet by mouth daily.    . carbidopa-levodopa (SINEMET CR) 50-200 MG tablet Take 1 tablet by mouth at bedtime. (Patient taking differently: Take 3.5 tablets by mouth daily. ) 30 tablet 5  . carbidopa-levodopa (SINEMET IR) 25-100 MG tablet TAKE 1 AND A 1/2  TABLETS BY MOUTH FOUR TIMES A DAY 132 tablet 3  . Cholecalciferol (VITAMIN D-3) 5000 UNITS TABS Take 1 tablet by mouth daily.    . Coenzyme Q10 200 MG capsule Take 200 mg by mouth 2 (two) times daily.    Marland Kitchen DHEA 50 MG TABS Take 1 tablet by mouth 2 (two) times daily.    . fenofibrate 160 MG tablet Take 160 mg by mouth daily before breakfast.    . fish oil-omega-3 fatty acids 1000 MG capsule Take 6,000 mg by mouth 4 (four) times daily - after meals and at bedtime. 2000 mg TID    . lamoTRIgine (LAMICTAL) 200 MG tablet TAKE ONE TABLET BY MOUTH EVERY NIGHT AT BEDTIME 90 tablet 1  . LORazepam (ATIVAN) 2 MG tablet 1/2 to 1 tablet daily as needed for panic attacks 30 tablet 0  . losartan (COZAAR) 50 MG tablet Take 50 mg by mouth daily. Pt.  Is unsure of Mg.    . magnesium oxide (MAG-OX) 400 MG tablet Take 400 mg by mouth daily.    . niacin 500 MG tablet Take 500 mg by mouth daily with breakfast.    . OXcarbazepine (TRILEPTAL) 150 MG tablet TAKE ONE TABLET BY MOUTH TWICE A DAY 60 tablet 1  . Potassium 99 MG TABS Take 1 tablet by mouth daily.     . pramipexole (MIRAPEX) 0.5 MG tablet TAKE ONE TABLET BY MOUTH THREE TIMES A DAY 270 tablet 1  . pravastatin (PRAVACHOL) 80 MG tablet Take 80 mg by mouth daily before breakfast.     . QUEtiapine (SEROQUEL XR) 300 MG 24 hr tablet Take 1 tablet (300 mg total) by mouth at bedtime. 30 tablet 0  .  valsartan (DIOVAN) 160 MG tablet Take 160 mg by mouth daily.  5   No current facility-administered medications on file prior to visit.     Observations/Objective:   There were no vitals filed for this visit. GEN:  The patient appears stated age and is in NAD.  Neurological examination:  Orientation: The patient is alert and oriented x3. Cranial nerves: There is good facial symmetry. There is ***facial hypomimia.  The speech is fluent and clear. Soft palate rises symmetrically and there is no tongue deviation. Hearing is intact to conversational tone. Motor: Strength is at least antigravity x 4.   Shoulder shrug is equal and symmetric.  There is no pronator drift.  Movement examination: Tone: unable Abnormal movements: *** Coordination:  There is *** decremation with RAM's, *** Gait and Station: The patient has *** difficulty arising out of a deep-seated chair without the use of the hands. The patient's stride length is ***.      Assessment and Plan:   1.  Parkinsons Disease  -Continue pramipexole, 0.5 mg 3 times per day  -Continue carbidopa/levodopa 25/100, 1.5 mg 4 times per day  -Continue amantadine, 100 mg twice per day 2.  Chronic low back pain  -Has seen neurosurgery and Dr. Letta Pate in the past.  Currently not following with anybody 3.  Depression  -Following with Dr. Clovis Pu. 4.  History of melanoma, stage IIIb  -Following with dermatology  Follow Up Instructions:    -I discussed the assessment and treatment plan with the patient. The patient was provided an opportunity to ask questions and all were answered. The patient agreed with the plan and demonstrated an understanding of the instructions.   The patient was advised to call back or seek an in-person evaluation if the symptoms worsen or if the condition fails to improve as anticipated.    Total time spent on today's visit was ***minutes, including both face-to-face time and nonface-to-face time.  Time included  that spent on review of records (prior notes available to me/labs/imaging if pertinent), discussing treatment and goals, answering patient's questions and coordinating care.   Alonza Bogus, DO

## 2019-02-28 ENCOUNTER — Telehealth: Payer: Medicare Other | Admitting: Neurology

## 2019-03-06 ENCOUNTER — Telehealth: Payer: Self-pay | Admitting: Neurology

## 2019-03-06 NOTE — Telephone Encounter (Signed)
Please advise on below  

## 2019-03-06 NOTE — Telephone Encounter (Signed)
I believe he had an appt 1/26 but we couldn't get ahold of him.  He has an upcoming appt in a few weeks and we will address at that point.  However, as I have told him in the past, options are limited given psychiatric meds that cause tremor.

## 2019-03-06 NOTE — Telephone Encounter (Signed)
Patient called regarding his Carbidopa Levodopa medication. He said that the Amantadine medication has not helped his shaking. He said he has been taking it for 3 months and he feels his shaking is worse. He uses [facility] on [employer]. Please Call. Thank you

## 2019-03-06 NOTE — Telephone Encounter (Signed)
Pt is aware and has no questions or concerns at this time  

## 2019-03-27 NOTE — Progress Notes (Signed)
Virtual Visit via Video Note The purpose of this virtual visit is to provide medical care while limiting exposure to the novel coronavirus.    Consent was obtained for video visit:  Yes.   Answered questions that patient had about telehealth interaction:  Yes.   I discussed the limitations, risks, security and privacy concerns of performing an evaluation and management service by telemedicine. I also discussed with the patient that there may be a patient responsible charge related to this service. The patient expressed understanding and agreed to proceed.  Pt location: Home Physician Location: office Name of referring provider:  Burnard Bunting, MD I connected with Loreen Freud Bushway at patients initiation/request on 03/28/2019 at  2:00 PM EST by video enabled telemedicine application and verified that I am speaking with the correct person using two identifiers. Pt MRN:  [id number] Pt DOB:  01-20-1951 Video Participants:  Loreen Freud Garmany;     History of Present Illness:  Patient seen today in follow-up for parkinsonism.  Amantadine was added last visit.  I got a call from his wife in November that the patient stopped the amantadine because he was shaking.  She was calling to ask what she could do for the shaking.  I expressed my confusion, as I added it for the tremor, but then he stopped it because he was shaking.  He thought it made tremor worse, but wife stated that he was going to try it again.  He reports that he is still taking it bid.  He is noting tremor.  Pt denies falls but has had near falls.  Pt denies lightheadedness (maybe 1 time per month but was daily).  No near syncope.  No hallucinations.  Medical records are reviewed since our last visit.  Patient has been to physical therapy.  Patient also has followed up with Dr. Clovis Pu.  Current movement d/o meds:  Pramipexole 0.5 mg 3 times per day Carbidopa/levodopa 25/100, 1.5 tablets 4 times per day Amantadine 100 mg twice per  day   Current Outpatient Medications on File Prior to Visit  Medication Sig Dispense Refill  . amantadine (SYMMETREL) 100 MG capsule Take 1 capsule (100 mg total) by mouth 2 (two) times daily. 180 capsule 1  . Ascorbic Acid (VITAMIN C) 1000 MG tablet Take 1,000 mg by mouth 2 (two) times daily.     . B Complex-C (B-COMPLEX WITH VITAMIN C) tablet Take 1 tablet by mouth daily.    . carbidopa-levodopa (SINEMET IR) 25-100 MG tablet TAKE 1 AND A 1/2  TABLETS BY MOUTH FOUR TIMES A DAY 132 tablet 3  . fenofibrate 160 MG tablet Take 160 mg by mouth daily before breakfast.    . lamoTRIgine (LAMICTAL) 200 MG tablet TAKE ONE TABLET BY MOUTH EVERY NIGHT AT BEDTIME 90 tablet 1  . LORazepam (ATIVAN) 2 MG tablet 1/2 to 1 tablet daily as needed for panic attacks 30 tablet 0  . losartan (COZAAR) 50 MG tablet Take 50 mg by mouth daily. Pt. Is unsure of Mg.    . magnesium oxide (MAG-OX) 400 MG tablet Take 400 mg by mouth daily.    . niacin 500 MG tablet Take 500 mg by mouth daily with breakfast.    . Omega-3 Fatty Acids (FISH OIL BURP-LESS PO) Take 1 capsule by mouth in the morning and at bedtime.    . OXcarbazepine (TRILEPTAL) 150 MG tablet TAKE ONE TABLET BY MOUTH TWICE A DAY 60 tablet 1  . pramipexole (MIRAPEX) 0.5 MG tablet TAKE  ONE TABLET BY MOUTH THREE TIMES A DAY 270 tablet 1  . pravastatin (PRAVACHOL) 80 MG tablet Take 80 mg by mouth daily before breakfast.     . QUEtiapine (SEROQUEL XR) 300 MG 24 hr tablet Take 1 tablet (300 mg total) by mouth at bedtime. 30 tablet 0  . valsartan (DIOVAN) 160 MG tablet Take 160 mg by mouth daily.  5  . Cholecalciferol (VITAMIN D-3) 5000 UNITS TABS Take 1 tablet by mouth daily.    . Potassium 99 MG TABS Take 1 tablet by mouth daily.      No current facility-administered medications on file prior to visit.     Observations/Objective:   Vitals:   03/28/19 0906  Weight: 275 lb (124.7 kg)  Height: 6\' 1"  (1.854 m)   GEN:  The patient appears stated age and is in NAD.   Neurological examination:  Orientation: The patient is alert and oriented x3. Cranial nerves: There is good facial symmetry. There is no significant facial hypomimia.  The speech is fluent and clear. Soft palate rises symmetrically and there is no tongue deviation. Hearing is intact to conversational tone. Motor: Strength is at least antigravity x 4.   Shoulder shrug is equal and symmetric.  There is no pronator drift.  Movement examination: Tone: unable Abnormal movements: none seen today but pt holding his telephone and states that he cannot show me hands at rest Coordination:  There is  decremation with RAM's, with any form of RAMS, including alternating supination and pronation of the forearm, hand opening and closing, finger taps, right greater than left Gait and Station: The patient is holding the phone while walking, but otherwise I was never able to assess the walking    Assessment and Plan:   1.  Parkinsons Disease  -Continue pramipexole, 0.5 mg 3 times per day  -Continue carbidopa/levodopa 25/100, 1.5 mg 4 times per day  -Increase amantadine, 100 mg 3 times per day.  Do not take the last dose at bedtime.  -[facility]  -Patient was given information on how to get the Covid vaccine. 2.  Chronic low back pain  -Has seen neurosurgery and Dr. Letta Pate in the past.  Currently not following with anybody 3.  Depression  -Following with Dr. Clovis Pu. 4.  History of melanoma, stage IIIb  -Following with dermatology 5.  Cerumen impaction  -Patient states that he has a cerumen impaction.  States he had 1 years ago and asks me about who he went to.  Apparently, we referred him to ENT.  Given the name of Dr. Constance Holster who he saw years ago.  He is going to call.  Follow Up Instructions:    -I discussed the assessment and treatment plan with the patient. The patient was provided an opportunity to ask questions and all were answered. The patient agreed with the plan and  demonstrated an understanding of the instructions.   The patient was advised to call back or seek an in-person evaluation if the symptoms worsen or if the condition fails to improve as anticipated.    Total time spent on today's visit was 30 minutes, including both face-to-face time and nonface-to-face time.  Time included that spent on review of records (prior notes available to me/labs/imaging if pertinent), discussing treatment and goals, answering patient's questions and coordinating care.   Alonza Bogus, DO

## 2019-03-28 ENCOUNTER — Encounter: Payer: Self-pay | Admitting: Neurology

## 2019-03-28 ENCOUNTER — Telehealth: Payer: Self-pay

## 2019-03-28 ENCOUNTER — Telehealth (INDEPENDENT_AMBULATORY_CARE_PROVIDER_SITE_OTHER): Payer: Medicare Other | Admitting: Neurology

## 2019-03-28 ENCOUNTER — Other Ambulatory Visit: Payer: Self-pay

## 2019-03-28 VITALS — Ht 73.0 in | Wt 275.0 lb

## 2019-03-28 DIAGNOSIS — G20 Parkinson's disease: Secondary | ICD-10-CM

## 2019-03-28 DIAGNOSIS — C4371 Malignant melanoma of right lower limb, including hip: Secondary | ICD-10-CM | POA: Diagnosis not present

## 2019-03-28 DIAGNOSIS — F331 Major depressive disorder, recurrent, moderate: Secondary | ICD-10-CM

## 2019-03-28 MED ORDER — AMANTADINE HCL 100 MG PO CAPS: 100.0000 mg | ORAL_CAPSULE | Freq: Three times a day (TID) | ORAL | 1 refills | Status: DC

## 2019-03-28 NOTE — Patient Instructions (Addendum)
You can now get information about the covid vaccine by clicking on [id number].  You can also call [phone], option 2   [facility] will [facility]

## 2019-03-28 NOTE — Telephone Encounter (Signed)
-----   Message from Kidder, DO sent at 03/28/2019  2:33 PM EST ----- Please do external referral for Physical therapy and send to [facility] (that is the name of the PT group).  They are in [place].  Loma Sousa or Judson Roch should be able to help.  The diagnosis is Parkinsons Disease

## 2019-03-28 NOTE — Telephone Encounter (Signed)
Referral for physical therapy has been placed and referral form has been faxed.

## 2019-03-31 DIAGNOSIS — Z23 Encounter for immunization: Secondary | ICD-10-CM | POA: Diagnosis not present

## 2019-04-02 ENCOUNTER — Other Ambulatory Visit: Payer: Self-pay | Admitting: Psychiatry

## 2019-04-05 ENCOUNTER — Telehealth: Payer: Self-pay | Admitting: Neurology

## 2019-04-05 NOTE — Telephone Encounter (Signed)
Spoke with Abigail Butts at [facility] and she states she can not reach the patient. She states she has left messages on his and his wife's voicemail and no one has returned their call.   Left message for patient to contact [facility] to setup an appt for physical therapy.

## 2019-04-05 NOTE — Telephone Encounter (Signed)
Ok.  Unfortunately, this has happened many times when we have referred him elsewhere as well.  Thank you.  Nothing further needs to be done.

## 2019-04-05 NOTE — Telephone Encounter (Signed)
Bill Guerrero from [facility] called regarding this patient and not being able to reach them. She said she has reached out to them everyday since she received the referral. She has not received a call back or anything. She is concerned for them. Please Call. Thanks

## 2019-04-17 ENCOUNTER — Ambulatory Visit (INDEPENDENT_AMBULATORY_CARE_PROVIDER_SITE_OTHER): Payer: Medicare Other | Admitting: Psychiatry

## 2019-04-17 ENCOUNTER — Encounter: Payer: Self-pay | Admitting: Psychiatry

## 2019-04-17 ENCOUNTER — Other Ambulatory Visit: Payer: Self-pay

## 2019-04-17 DIAGNOSIS — F5105 Insomnia due to other mental disorder: Secondary | ICD-10-CM

## 2019-04-17 DIAGNOSIS — F40240 Claustrophobia: Secondary | ICD-10-CM

## 2019-04-17 DIAGNOSIS — F3130 Bipolar disorder, current episode depressed, mild or moderate severity, unspecified: Secondary | ICD-10-CM | POA: Diagnosis not present

## 2019-04-17 DIAGNOSIS — F4001 Agoraphobia with panic disorder: Secondary | ICD-10-CM

## 2019-04-17 DIAGNOSIS — F411 Generalized anxiety disorder: Secondary | ICD-10-CM

## 2019-04-17 MED ORDER — QUETIAPINE FUMARATE ER 300 MG PO TB24: 300.0000 mg | ORAL_TABLET | Freq: Every day | ORAL | 3 refills | Status: DC

## 2019-04-17 NOTE — Progress Notes (Signed)
Desai Apa Langsam [id number] 1950/03/17 69 y.o.  Subjective:   Patient ID:  Bill Guerrero is a 69 y.o. (DOB 08/06/1950) male.  Chief Complaint:  Chief Complaint  Patient presents with  . Depression  . Anxiety  . Sleeping Problem  . Follow-up    med change and med managment  . Stress    Parkinson's dz  . Fatigue    HPI Bill Guerrero presents to the office today for follow-up of mood disorder, anxiety, and chronic insomnia.  He is chronically stressed and down due to the combination of dealing with Parkinson's disease and history of cancer.  Last seen in October 18, 2018.Marland Kitchen  No meds were changed. Ran out of Seroquel for 5 days since he was here and could not sleep.  Started playing banjo and taking lessons.  Practices daily.   Stress many things he can no longer do physically.  Can't do housework like before.  Getting him down.  Pushing himself to walk with a walker.  Pain in walking without walker.   Has done PT and finished.  Accidentally dropped dose of Trileptal for ? Time frame without a problem to 150 mg daily.  No mood swings noticed.   "Made it longer than they thought I would".  Still in remission with melanoma with follow up tomorrow.   But hard to get around.   Still has bad days emotionally up to a week at a time.  Anxiety is worse than depression.  Easily overwhelmed.    Pattern of EMA at 3 am and then falls asleep watching TV.  Become a habit. Then naps.   Occ forgets Seroquel.  Satisfied with meds.  No other new concerns.  Rarely uses Ativan except when goes to doctors' offices or with CT scans Dt claustrophobia. Pt reports that mood is Anxious and describes anxiety as Moderate. Anxiety symptoms include: Excessive Worry,. Pt reports has interrupted sleep. Pt reports that appetite is good. Pt reports that energy is lethargic and no change. Concentration is down slightly. Suicidal thoughts:  denied by patient.  Less dep and anxiety than last  visit.  Discussed concerns about the cost of Seroquel Exar and how bad he feels if he runs out of the medication.  PD followed by Dr. Carles Collet. Got 1st Covid shot.  Past Psychiatric Medication Trials:   Review of Systems:  Review of Systems  Constitutional: Positive for fatigue.  Musculoskeletal: Positive for arthralgias, back pain and gait problem.  Neurological: Positive for dizziness, syncope and weakness. Negative for tremors.       Balance problems from PD.  Psychiatric/Behavioral: Positive for sleep disturbance. Negative for agitation, behavioral problems, confusion, decreased concentration, dysphoric mood, hallucinations, self-injury and suicidal ideas. The patient is nervous/anxious. The patient is not hyperactive.   Frequent falls are a problem.  Can't pick himself up without help.  Medications: I have reviewed the patient's current medications.  Current Outpatient Medications  Medication Sig Dispense Refill  . amantadine (SYMMETREL) 100 MG capsule Take 1 capsule (100 mg total) by mouth 3 (three) times daily. 270 capsule 1  . Ascorbic Acid (VITAMIN C) 1000 MG tablet Take 1,000 mg by mouth 2 (two) times daily.     . B Complex-C (B-COMPLEX WITH VITAMIN C) tablet Take 1 tablet by mouth daily.    . carbidopa-levodopa (SINEMET IR) 25-100 MG tablet TAKE 1 AND A 1/2  TABLETS BY MOUTH FOUR TIMES A DAY 132 tablet 3  . Cholecalciferol (VITAMIN D-3) 5000 UNITS TABS Take  1 tablet by mouth daily.    . fenofibrate 160 MG tablet Take 160 mg by mouth daily before breakfast.    . lamoTRIgine (LAMICTAL) 200 MG tablet TAKE ONE TABLET BY MOUTH EVERY NIGHT AT BEDTIME 90 tablet 1  . LORazepam (ATIVAN) 2 MG tablet 1/2 to 1 tablet daily as needed for panic attacks 30 tablet 0  . losartan (COZAAR) 50 MG tablet Take 50 mg by mouth daily. Pt. Is unsure of Mg.    . magnesium oxide (MAG-OX) 400 MG tablet Take 400 mg by mouth daily.    . niacin 500 MG tablet Take 500 mg by mouth daily with breakfast.    .  Omega-3 Fatty Acids (FISH OIL BURP-LESS PO) Take 1 capsule by mouth in the morning and at bedtime.    . OXcarbazepine (TRILEPTAL) 150 MG tablet TAKE ONE TABLET BY MOUTH TWICE A DAY 60 tablet 0  . Potassium 99 MG TABS Take 1 tablet by mouth daily.     . pramipexole (MIRAPEX) 0.5 MG tablet TAKE ONE TABLET BY MOUTH THREE TIMES A DAY 270 tablet 1  . pravastatin (PRAVACHOL) 80 MG tablet Take 80 mg by mouth daily before breakfast.     . QUEtiapine (SEROQUEL XR) 300 MG 24 hr tablet Take 1 tablet (300 mg total) by mouth at bedtime. 90 tablet 3  . valsartan (DIOVAN) 160 MG tablet Take 160 mg by mouth daily.  5   No current facility-administered medications for this visit.    Medication Side Effects: None  Allergies: No Known Allergies  Past Medical History:  Diagnosis Date  . Arthritis    hips replacement  . Bipolar 1 disorder (Bill Guerrero)   . Hyperlipemia   . Hypertension   . 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 Multiple body piercings   . Tremor    RT HAND    Family History  Problem Relation Age of Onset  . Leukemia Mother   . Healthy Sister   . Healthy Sister   . Healthy Son   . Healthy Son   . Healthy Daughter     Social History   Socioeconomic History  . Marital status: Married    Spouse name: Not on file  . Number of children: 3  . Years of education: Not on file  . Highest education level: 12th grade  Occupational History  . Not on file  Tobacco Use  . Smoking status: Never Smoker  . Smokeless tobacco: Never Used  Substance and Sexual Activity  . Alcohol use: No    Alcohol/week: 1.0 standard drinks    Types: 1 Glasses of wine per week    Comment: no alcohol in 2 years  . Drug use: No  . Sexual activity: Yes  Other Topics Concern  . Not on file  Social History Narrative   Right handed   2 Story home    Lives with spouse and son   Social Determinants of Health   Financial Resource  Strain:   . Difficulty of Paying Living Expenses:   Food Insecurity:   . Worried About Charity fundraiser in the Last Year:   . Arboriculturist in the Last Year:   Transportation Needs:   . Film/video editor (Medical):   Marland Kitchen Lack of Transportation (Non-Medical):   Physical Activity:   . Days of Exercise per Week:   . Minutes of Exercise per Session:  Stress:   . Feeling of Stress :   Social Connections:   . Frequency of Communication with Friends and Family:   . Frequency of Social Gatherings with Friends and Family:   . Attends Religious Services:   . Active Member of Clubs or Organizations:   . Attends Archivist Meetings:   Marland Kitchen Marital Status:   Intimate Partner Violence:   . Fear of Current or Ex-Partner:   . Emotionally Abused:   Marland Kitchen Physically Abused:   . Sexually Abused:     Past Medical History, Surgical history, Social history, and Family history were reviewed and updated as appropriate.   Please see review of systems for further details on the patient's review from today.   Objective:   Physical Exam:  There were no vitals taken for this visit.  Physical Exam Constitutional:      General: He is not in acute distress.    Appearance: Normal appearance. He is well-developed. He is obese.  Musculoskeletal:        General: No deformity.  Neurological:     Mental Status: He is alert and oriented to person, place, and time.     Motor: Tremor present.     Coordination: Coordination abnormal.     Gait: Gait abnormal.     Comments: Uses cane for balance  Psychiatric:        Attention and Perception: Attention and perception normal.        Mood and Affect: Mood is anxious and depressed. Affect is not labile, blunt, angry or inappropriate.        Speech: Speech normal. Speech is not slurred.        Behavior: Behavior is slowed.        Thought Content: Thought content normal. Thought content is not paranoid. Thought content does not include homicidal or  suicidal ideation. Thought content does not include homicidal or suicidal plan.        Cognition and Memory: Cognition normal.        Judgment: Judgment normal.     Comments: Insight intact. No auditory or visual hallucinations. No delusions.  Less preoccupation with health. Mild depression over functional decline.     Lab Review:     Component Value Date/Time   NA 141 03/30/2016 1219   K 3.7 03/30/2016 1219   CL 107 03/30/2016 1219   CO2 25 03/30/2016 1219   GLUCOSE 97 03/30/2016 1219   BUN 17 03/30/2016 1219   CREATININE 0.80 03/30/2016 1219   CALCIUM 9.4 03/30/2016 1219   PROT 6.9 03/30/2016 1219   ALBUMIN 4.7 03/30/2016 1219   AST 21 03/30/2016 1219   ALT 27 03/30/2016 1219   ALKPHOS 65 03/30/2016 1219   BILITOT 0.6 03/30/2016 1219   GFRNONAA 86 (L) 10/06/2012 1130   GFRAA >90 10/06/2012 1130       Component Value Date/Time   WBC 5.6 11/26/2014 1133   RBC 4.04 (L) 11/26/2014 1133   HGB 12.8 (L) 11/26/2014 1133   HCT 38.8 (L) 11/26/2014 1133   PLT 195.0 11/26/2014 1133   MCV 96.2 11/26/2014 1133   MCH 31.9 10/06/2012 1130   MCHC 33.0 11/26/2014 1133   RDW 13.6 11/26/2014 1133   LYMPHSABS 1.1 11/26/2014 1133   MONOABS 0.6 11/26/2014 1133   EOSABS 0.1 11/26/2014 1133   BASOSABS 0.0 11/26/2014 1133    Lithium Lvl  Date Value Ref Range Status  08/17/2010 0.44 (L) 0.80 - 1.40 mEq/L Final     No  results found for: PHENYTOIN, PHENOBARB, VALPROATE, CBMZ   .res Assessment: Plan:    Bipolar I disorder, most recent episode depressed ([place]) - Plan: QUEtiapine (SEROQUEL XR) 300 MG 24 hr tablet  Generalized anxiety disorder  Claustrophobia  Panic disorder with agoraphobia  Insomnia due to mental condition   Disc balancing benefit vs SE of Seroquel in view of Parkinson's Dz.   Discussed potential metabolic side effects associated with atypical antipsychotics, as well as potential risk for movement side effects. Advised pt to contact office if movement side  effects occur.  Currently seems to be doing ok with depression and mood swings.  Call if more anxiety or mood swings after reducing Trileptal.  It's a very low dose.  Anxiety is not fully manageable but med changes unlikely to help.  Overall doing OK.  Supportive therapy and health direction given severe dx PD and malignant melanoma.  Enc cont exercise which is helping.  Disc risk of PD meds affecting psych status including  Risk of PD.  Chronic insomnia is stable.  Asks for change in lorazepam for procedures for panic.  We discussed the short-term risks associated with benzodiazepines including sedation and increased fall risk among others.  Discussed long-term side effect risk including dependence, potential withdrawal symptoms, and the potential eventual dose-related risk of dementia.  He is not taking much of this.  Disc costs of Rx and how to manage for ex with GoodRx he forgot to look into this and he was reminded about it today.  He was given written information about it.  No med changes this visit.  Patient agrees with this plan.  FU 6 Mos  Lynder Parents, MD, DFAPA     Please see After Visit Summary for patient specific instructions.  Future Appointments  Date Time Provider [place]  05/29/2019  2:00 PM Percival Spanish, [place] [facility]  09/28/2019  1:00 PM [name], [name], DO [facility] None    No orders of the defined types were placed in this encounter.     -------------------------------

## 2019-04-28 DIAGNOSIS — Z23 Encounter for immunization: Secondary | ICD-10-CM | POA: Diagnosis not present

## 2019-05-09 DIAGNOSIS — Q828 Other specified congenital malformations of skin: Secondary | ICD-10-CM | POA: Diagnosis not present

## 2019-05-09 DIAGNOSIS — L821 Other seborrheic keratosis: Secondary | ICD-10-CM | POA: Diagnosis not present

## 2019-05-09 DIAGNOSIS — B353 Tinea pedis: Secondary | ICD-10-CM | POA: Diagnosis not present

## 2019-05-09 DIAGNOSIS — L738 Other specified follicular disorders: Secondary | ICD-10-CM | POA: Diagnosis not present

## 2019-05-09 DIAGNOSIS — Z85820 Personal history of malignant melanoma of skin: Secondary | ICD-10-CM | POA: Diagnosis not present

## 2019-05-09 DIAGNOSIS — D229 Melanocytic nevi, unspecified: Secondary | ICD-10-CM | POA: Diagnosis not present

## 2019-05-25 ENCOUNTER — Other Ambulatory Visit: Payer: Self-pay | Admitting: Neurology

## 2019-05-26 ENCOUNTER — Other Ambulatory Visit: Payer: Self-pay | Admitting: Neurology

## 2019-05-29 ENCOUNTER — Ambulatory Visit: Payer: Medicare Other | Admitting: Physical Therapy

## 2019-05-29 DIAGNOSIS — R262 Difficulty in walking, not elsewhere classified: Secondary | ICD-10-CM | POA: Diagnosis not present

## 2019-05-29 DIAGNOSIS — R279 Unspecified lack of coordination: Secondary | ICD-10-CM | POA: Diagnosis not present

## 2019-05-29 DIAGNOSIS — G20 Parkinson's disease: Secondary | ICD-10-CM | POA: Diagnosis not present

## 2019-06-07 DIAGNOSIS — R279 Unspecified lack of coordination: Secondary | ICD-10-CM | POA: Diagnosis not present

## 2019-06-07 DIAGNOSIS — G20 Parkinson's disease: Secondary | ICD-10-CM | POA: Diagnosis not present

## 2019-06-07 DIAGNOSIS — R262 Difficulty in walking, not elsewhere classified: Secondary | ICD-10-CM | POA: Diagnosis not present

## 2019-06-09 DIAGNOSIS — G20 Parkinson's disease: Secondary | ICD-10-CM | POA: Diagnosis not present

## 2019-06-09 DIAGNOSIS — R279 Unspecified lack of coordination: Secondary | ICD-10-CM | POA: Diagnosis not present

## 2019-06-09 DIAGNOSIS — R262 Difficulty in walking, not elsewhere classified: Secondary | ICD-10-CM | POA: Diagnosis not present

## 2019-06-12 DIAGNOSIS — R262 Difficulty in walking, not elsewhere classified: Secondary | ICD-10-CM | POA: Diagnosis not present

## 2019-06-12 DIAGNOSIS — R279 Unspecified lack of coordination: Secondary | ICD-10-CM | POA: Diagnosis not present

## 2019-06-12 DIAGNOSIS — G20 Parkinson's disease: Secondary | ICD-10-CM | POA: Diagnosis not present

## 2019-06-13 ENCOUNTER — Other Ambulatory Visit: Payer: Self-pay | Admitting: Psychiatry

## 2019-06-14 ENCOUNTER — Other Ambulatory Visit: Payer: Self-pay | Admitting: Neurology

## 2019-06-15 ENCOUNTER — Telehealth: Payer: Self-pay | Admitting: Neurology

## 2019-06-15 DIAGNOSIS — G20 Parkinson's disease: Secondary | ICD-10-CM | POA: Diagnosis not present

## 2019-06-15 DIAGNOSIS — M542 Cervicalgia: Secondary | ICD-10-CM

## 2019-06-15 DIAGNOSIS — R262 Difficulty in walking, not elsewhere classified: Secondary | ICD-10-CM | POA: Diagnosis not present

## 2019-06-15 DIAGNOSIS — R279 Unspecified lack of coordination: Secondary | ICD-10-CM | POA: Diagnosis not present

## 2019-06-15 NOTE — Telephone Encounter (Signed)
Abigail Butts from [facility] called in to see about getting this patient occupational therapy with them. The fax number is [phone] to send over the paperwork.

## 2019-06-16 NOTE — Telephone Encounter (Signed)
Spoke with Abigail Butts who states the physical therapist wants the patient to have occupational therapy for the patient can play string instruments, and get relieve with neck pain. He states they have a good OT program and really thinks the patient will benefit from it.   Ok per Dr [name] for patient to do OT. Verbal order given to Cecilie Lowers, Physical Therapist.

## 2019-06-19 DIAGNOSIS — R262 Difficulty in walking, not elsewhere classified: Secondary | ICD-10-CM | POA: Diagnosis not present

## 2019-06-19 DIAGNOSIS — R279 Unspecified lack of coordination: Secondary | ICD-10-CM | POA: Diagnosis not present

## 2019-06-19 DIAGNOSIS — G20 Parkinson's disease: Secondary | ICD-10-CM | POA: Diagnosis not present

## 2019-06-21 DIAGNOSIS — G20 Parkinson's disease: Secondary | ICD-10-CM | POA: Diagnosis not present

## 2019-06-21 DIAGNOSIS — R279 Unspecified lack of coordination: Secondary | ICD-10-CM | POA: Diagnosis not present

## 2019-06-21 DIAGNOSIS — R262 Difficulty in walking, not elsewhere classified: Secondary | ICD-10-CM | POA: Diagnosis not present

## 2019-06-26 DIAGNOSIS — R279 Unspecified lack of coordination: Secondary | ICD-10-CM | POA: Diagnosis not present

## 2019-06-26 DIAGNOSIS — R262 Difficulty in walking, not elsewhere classified: Secondary | ICD-10-CM | POA: Diagnosis not present

## 2019-06-26 DIAGNOSIS — G20 Parkinson's disease: Secondary | ICD-10-CM | POA: Diagnosis not present

## 2019-07-04 DIAGNOSIS — Z79899 Other long term (current) drug therapy: Secondary | ICD-10-CM | POA: Diagnosis not present

## 2019-07-04 DIAGNOSIS — C4371 Malignant melanoma of right lower limb, including hip: Secondary | ICD-10-CM | POA: Diagnosis not present

## 2019-07-04 DIAGNOSIS — K121 Other forms of stomatitis: Secondary | ICD-10-CM | POA: Diagnosis not present

## 2019-07-04 DIAGNOSIS — B379 Candidiasis, unspecified: Secondary | ICD-10-CM | POA: Diagnosis not present

## 2019-07-04 DIAGNOSIS — G20 Parkinson's disease: Secondary | ICD-10-CM | POA: Diagnosis not present

## 2019-07-04 DIAGNOSIS — Z85820 Personal history of malignant melanoma of skin: Secondary | ICD-10-CM | POA: Diagnosis not present

## 2019-07-04 DIAGNOSIS — Z08 Encounter for follow-up examination after completed treatment for malignant neoplasm: Secondary | ICD-10-CM | POA: Diagnosis not present

## 2019-07-04 DIAGNOSIS — R600 Localized edema: Secondary | ICD-10-CM | POA: Diagnosis not present

## 2019-07-06 ENCOUNTER — Telehealth: Payer: Self-pay | Admitting: Neurology

## 2019-07-06 NOTE — Telephone Encounter (Signed)
He will need to use a walker at all times.  Make sure wife on dpr

## 2019-07-06 NOTE — Telephone Encounter (Signed)
Wife stated he is using his walker but the patient seems more confused and having more hallucinations. She stated he has had 7 or 8 falls in the last two months busing his head open etc. She wants to know if there is anything she can do at home to be more aggressive with the parkinson's at home.

## 2019-07-06 NOTE — Telephone Encounter (Signed)
Stop amantadine Do CT head without contrast dx:  hallucinations, fall, MS change Make sure he is taking all his psychiatric meds

## 2019-07-06 NOTE — Telephone Encounter (Signed)
Patient's wife called in wanting to speak with someone about her husbands symptoms. He has been falling more often. She would like some advice on what they can do.

## 2019-07-07 NOTE — Telephone Encounter (Signed)
No answer at 351 07/07/2019.

## 2019-07-11 ENCOUNTER — Other Ambulatory Visit: Payer: Self-pay | Admitting: Psychiatry

## 2019-07-12 ENCOUNTER — Telehealth: Payer: Self-pay

## 2019-07-12 NOTE — Telephone Encounter (Signed)
I contacted patient and finally able to reach him, he wil stop amantadine and note patient had CT at his cancer center. Says that all was fine with the brain. Will call back Thursday if any new orders for patient.

## 2019-07-13 NOTE — Telephone Encounter (Signed)
He had a PET scan, not a CT brain

## 2019-07-13 NOTE — Telephone Encounter (Signed)
Patient is going to contact his insurance and see if they will cover this. He states,"he will call back to the office and arrange it if he decides to do it."thanks. FYI

## 2019-07-27 ENCOUNTER — Other Ambulatory Visit: Payer: Self-pay | Admitting: Neurology

## 2019-07-28 NOTE — Telephone Encounter (Signed)
Rx(s) sent to pharmacy electronically.  

## 2019-08-19 DIAGNOSIS — M961 Postlaminectomy syndrome, not elsewhere classified: Secondary | ICD-10-CM | POA: Diagnosis not present

## 2019-08-30 ENCOUNTER — Other Ambulatory Visit: Payer: Self-pay | Admitting: Neurology

## 2019-09-09 ENCOUNTER — Other Ambulatory Visit: Payer: Self-pay | Admitting: Psychiatry

## 2019-09-18 ENCOUNTER — Ambulatory Visit (INDEPENDENT_AMBULATORY_CARE_PROVIDER_SITE_OTHER): Payer: Medicare Other | Admitting: Psychiatry

## 2019-09-18 ENCOUNTER — Encounter: Payer: Self-pay | Admitting: Psychiatry

## 2019-09-18 ENCOUNTER — Other Ambulatory Visit: Payer: Self-pay

## 2019-09-18 DIAGNOSIS — F3130 Bipolar disorder, current episode depressed, mild or moderate severity, unspecified: Secondary | ICD-10-CM

## 2019-09-18 DIAGNOSIS — F411 Generalized anxiety disorder: Secondary | ICD-10-CM | POA: Diagnosis not present

## 2019-09-18 DIAGNOSIS — F4001 Agoraphobia with panic disorder: Secondary | ICD-10-CM

## 2019-09-18 DIAGNOSIS — F40240 Claustrophobia: Secondary | ICD-10-CM | POA: Diagnosis not present

## 2019-09-18 DIAGNOSIS — F5105 Insomnia due to other mental disorder: Secondary | ICD-10-CM

## 2019-09-18 MED ORDER — QUETIAPINE FUMARATE ER 200 MG PO TB24: 200.0000 mg | ORAL_TABLET | Freq: Every day | ORAL | 1 refills | Status: DC

## 2019-09-18 NOTE — Progress Notes (Signed)
[name] [id number] 1950/06/16 69 y.o.  Subjective:   Patient ID:  Bill Guerrero is a 69 y.o. (DOB 09-22-1950) male.  Chief Complaint:  Chief Complaint  Patient presents with  . Follow-up  . Anxiety  . Depression  . Stress    health    HPI Bill Guerrero presents to the office today for follow-up of mood disorder, anxiety, and chronic insomnia.  He is chronically stressed and down due to the combination of dealing with Parkinson's disease and history of cancer.  Last seen in march 2021.  No meds were changed. The following was noted: Started playing banjo and taking lessons.  Practices daily.   Stress many things he can no longer do physically.  Can't do housework like before.  Getting him down.  Pushing himself to walk with a walker.  Pain in walking without walker.   Has done PT and finished. Accidentally dropped dose of Trileptal for ? Time frame without a problem to 150 mg daily.  No mood swings noticed.   "Made it longer than they thought I would".  Still in remission with melanoma with follow up tomorrow.   But hard to get around.  Still has bad days emotionally up to a week at a time.  Anxiety is worse than depression.  Easily overwhelmed.   Pattern of EMA at 3 am and then falls asleep watching TV.  Become a habit. Then naps.   Occ forgets Seroquel.  09/18/19 appt with the following noted: Bad day with weakness and PD.  Fell yesterday down 3-4 steps. It's  Getting worse. Not getting better on the banjo.   Started [place].  Interest in music daily.   It's so much fun. Hallucinated on amantadine and fell and stopped. Little Ativan. No current treatment for cancer. Questions about timing of quetiapine and gets dizzy and weak  Satisfied with meds.  No other new concerns.  Rarely uses Ativan except when goes to doctors' offices or with CT scans Dt claustrophobia. Pt reports that mood is Anxious and describes anxiety as Moderate. Anxiety symptoms include:  Excessive Worry,. Pt reports has interrupted sleep. Pt reports that appetite is good. Pt reports that energy is lethargic and no change. Concentration is down slightly. Suicidal thoughts:  denied by patient.  Less dep and anxiety than last visit.  Discussed concerns about the cost of Seroquel Exar and how bad he feels if he runs out of the medication.  PD followed by Dr. Carles Collet. Got 1st Covid shot.  Past Psychiatric Medication Trials:   Review of Systems:  Review of Systems  Constitutional: Positive for fatigue.  Cardiovascular: Negative for palpitations.  Musculoskeletal: Positive for arthralgias, back pain and gait problem.  Neurological: Positive for dizziness, syncope and weakness. Negative for tremors.       Balance problems from PD.  Psychiatric/Behavioral: Positive for sleep disturbance. Negative for agitation, behavioral problems, confusion, decreased concentration, dysphoric mood, hallucinations, self-injury and suicidal ideas. The patient is nervous/anxious. The patient is not hyperactive.   Frequent falls are a problem.  Can't pick himself up without help.  Medications: I have reviewed the patient's current medications.  Current Outpatient Medications  Medication Sig Dispense Refill  . Ascorbic Acid (VITAMIN C) 1000 MG tablet Take 1,000 mg by mouth 2 (two) times daily.     . B Complex-C (B-COMPLEX WITH VITAMIN C) tablet Take 1 tablet by mouth daily.    . carbidopa-levodopa (SINEMET IR) 25-100 MG tablet TAKE 1 AND 1/2 TABLET BY  MOUTH FOUR TIMES A DAY 180 tablet 2  . Cholecalciferol (VITAMIN D-3) 5000 UNITS TABS Take 1 tablet by mouth daily.    . fenofibrate 160 MG tablet Take 160 mg by mouth daily before breakfast.    . lamoTRIgine (LAMICTAL) 200 MG tablet TAKE ONE TABLET BY MOUTH EVERY NIGHT AT BEDTIME 90 tablet 0  . LORazepam (ATIVAN) 2 MG tablet 1/2 to 1 tablet daily as needed for panic attacks 30 tablet 0  . losartan (COZAAR) 50 MG tablet Take 50 mg by mouth daily. Pt. Is  unsure of Mg.    . magnesium oxide (MAG-OX) 400 MG tablet Take 400 mg by mouth daily.    . niacin 500 MG tablet Take 500 mg by mouth daily with breakfast.    . Omega-3 Fatty Acids (FISH OIL BURP-LESS PO) Take 1 capsule by mouth in the morning and at bedtime.    . OXcarbazepine (TRILEPTAL) 150 MG tablet TAKE ONE TABLET BY MOUTH TWICE A DAY 60 tablet 0  . Potassium 99 MG TABS Take 1 tablet by mouth daily.     . pramipexole (MIRAPEX) 0.5 MG tablet TAKE ONE TABLET BY MOUTH THREE TIMES A DAY 90 tablet 0  . pravastatin (PRAVACHOL) 80 MG tablet Take 80 mg by mouth daily before breakfast.     . QUEtiapine (SEROQUEL XR) 200 MG 24 hr tablet Take 1 tablet (200 mg total) by mouth at bedtime. 90 tablet 1  . valsartan (DIOVAN) 160 MG tablet Take 160 mg by mouth daily.  5  . amantadine (SYMMETREL) 100 MG capsule Take 1 capsule (100 mg total) by mouth 3 (three) times daily. (Patient not taking: Reported on 09/18/2019) 270 capsule 1   No current facility-administered medications for this visit.    Medication Side Effects: None  Allergies: No Known Allergies  Past Medical History:  Diagnosis Date  . Arthritis    hips replacement  . Bipolar 1 disorder ([place])   . Hyperlipemia   . Hypertension   . 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 Multiple body piercings   . Tremor    RT HAND    Family History  Problem Relation Age of Onset  . Leukemia Mother   . Healthy Sister   . Healthy Sister   . Healthy Son   . Healthy Son   . Healthy Daughter     Social History   Socioeconomic History  . Marital status: Married    Spouse name: Not on file  . Number of children: 3  . Years of education: Not on file  . Highest education level: 12th grade  Occupational History  . Not on file  Tobacco Use  . Smoking status: Never Smoker  . Smokeless tobacco: Never Used  Vaping Use  . Vaping Use: Never used  Substance and Sexual  Activity  . Alcohol use: No    Alcohol/week: 1.0 standard drink    Types: 1 Glasses of wine per week    Comment: no alcohol in 2 years  . Drug use: No  . Sexual activity: Yes  Other Topics Concern  . Not on file  Social History Narrative   Right handed   2 Story home    Lives with spouse and son   Social Determinants of Health   Financial Resource Strain:   . Difficulty of Paying Living Expenses:   Food Insecurity:   . Worried About Charity fundraiser  in the Last Year:   . [place] in the Last Year:   Transportation Needs:   . Film/video editor (Medical):   Marland Kitchen Lack of Transportation (Non-Medical):   Physical Activity:   . Days of Exercise per Week:   . Minutes of Exercise per Session:   Stress:   . Feeling of Stress :   Social Connections:   . Frequency of Communication with Friends and Family:   . Frequency of Social Gatherings with Friends and Family:   . Attends Religious Services:   . Active Member of Clubs or Organizations:   . Attends Archivist Meetings:   Marland Kitchen Marital Status:   Intimate Partner Violence:   . Fear of Current or Ex-Partner:   . Emotionally Abused:   Marland Kitchen Physically Abused:   . Sexually Abused:     Past Medical History, Surgical history, Social history, and Family history were reviewed and updated as appropriate.   Please see review of systems for further details on the patient's review from today.   Objective:   Physical Exam:  There were no vitals taken for this visit.  Physical Exam Constitutional:      General: He is not in acute distress.    Appearance: Normal appearance. He is well-developed. He is obese.  Musculoskeletal:        General: No deformity.  Neurological:     Mental Status: He is alert and oriented to person, place, and time.     Motor: Tremor present.     Coordination: Coordination abnormal.     Gait: Gait abnormal.     Comments: Uses walker for balance  Psychiatric:        Attention and  Perception: Attention and perception normal.        Mood and Affect: Mood is anxious and depressed. Affect is not labile, blunt, angry or inappropriate.        Speech: Speech normal. Speech is not slurred.        Behavior: Behavior is slowed.        Thought Content: Thought content normal. Thought content is not paranoid. Thought content does not include homicidal or suicidal ideation. Thought content does not include homicidal or suicidal plan.        Cognition and Memory: Cognition normal.        Judgment: Judgment normal.     Comments: Insight intact. No auditory or visual hallucinations. No delusions.  Less preoccupation with health. Mild depression over functional decline.     Lab Review:     Component Value Date/Time   NA 141 03/30/2016 1219   K 3.7 03/30/2016 1219   CL 107 03/30/2016 1219   CO2 25 03/30/2016 1219   GLUCOSE 97 03/30/2016 1219   BUN 17 03/30/2016 1219   CREATININE 0.80 03/30/2016 1219   CALCIUM 9.4 03/30/2016 1219   PROT 6.9 03/30/2016 1219   ALBUMIN 4.7 03/30/2016 1219   AST 21 03/30/2016 1219   ALT 27 03/30/2016 1219   ALKPHOS 65 03/30/2016 1219   BILITOT 0.6 03/30/2016 1219   GFRNONAA 86 (L) 10/06/2012 1130   GFRAA >90 10/06/2012 1130       Component Value Date/Time   WBC 5.6 11/26/2014 1133   RBC 4.04 (L) 11/26/2014 1133   HGB 12.8 (L) 11/26/2014 1133   HCT 38.8 (L) 11/26/2014 1133   PLT 195.0 11/26/2014 1133   MCV 96.2 11/26/2014 1133   MCH 31.9 10/06/2012 1130   MCHC 33.0 11/26/2014 1133  RDW 13.6 11/26/2014 1133   LYMPHSABS 1.1 11/26/2014 1133   MONOABS 0.6 11/26/2014 1133   EOSABS 0.1 11/26/2014 1133   BASOSABS 0.0 11/26/2014 1133    Lithium Lvl  Date Value Ref Range Status  08/17/2010 0.44 (L) 0.80 - 1.40 mEq/L Final     No results found for: PHENYTOIN, PHENOBARB, VALPROATE, CBMZ   .res Assessment: Plan:    Bipolar I disorder, most recent episode depressed ([place]) - Plan: QUEtiapine (SEROQUEL XR) 200 MG 24 hr  tablet  Generalized anxiety disorder  Claustrophobia  Panic disorder with agoraphobia  Insomnia due to mental condition   Disc balancing benefit vs SE of Seroquel in view of Parkinson's Dz.   Discussed potential metabolic side effects associated with atypical antipsychotics, as well as potential risk for movement side effects. Advised pt to contact office if movement side effects occur.  Consider reducing quetiapine ER to minimize SE but risk increase mood problems. Currently seems to be doing ok with depression and mood swings.  Yes bc he gets up at night, REDUCE quetiapine to ER 200 mg nightly. Call if more anxiety or mood swings after reducing Trileptal.  It's a very low dose.  Anxiety is not fully manageable but med changes unlikely to help.  Overall doing OK.  Supportive therapy and health direction given severe dx PD and malignant melanoma.  Enc cont exercise which is helping.  Disc risk of PD meds affecting psych status including  Risk of PD.  Chronic insomnia is stable.  We discussed the short-term risks associated with benzodiazepines including sedation and increased fall risk among others.  Discussed long-term side effect risk including dependence, potential withdrawal symptoms, and the potential eventual dose-related risk of dementia.  He is not taking much of this.  Disc costs of Rx and how to manage for ex with GoodRx he forgot to look into this and he was reminded about it today.  He was given written information about it.   Patient agrees with this plan.  FU 4 Mos  Lynder Parents, MD, DFAPA     Please see After Visit Summary for patient specific instructions.  Future Appointments  Date Time Provider [place]  09/28/2019  1:00 PM [name], [name], DO [facility] None    No orders of the defined types were placed in this encounter.     -------------------------------

## 2019-09-26 NOTE — Progress Notes (Deleted)
Assessment/Plan:   1.  Parkinsons Disease  -***Continue pramipexole 0.5 mg 3 times per day  -Continue carbidopa/levodopa 25/100, 1.5 mg 4 times per day 2.  Chronic low back pain             -Has seen neurosurgery and Dr. Letta Pate in the past.  Currently not following with anybody 3.  Depression             -Following with Dr. Clovis Pu.  Last saw him on August 16.  -On Seroquel XR, 200 mg daily, which may contribute to Parkinson symptoms 4.  History of melanoma, stage IIIb             -Following with dermatology  Subjective:   Bill Guerrero was seen today in follow up for Parkinsonism.  My previous records were reviewed prior to todays visit as well as outside records available to me.  Was referred to rehab without [facility] last visit and I did review his initial evaluation but nothing thereafter.  He reports that ***.pt had a fall a few weeks ago down a few steps.  Pt denies lightheadedness, near syncope.  Patient stopped amantadine since last visit, stating that he had a hallucination and follows with the medicine.  Mood has been good.  Follows with Dr. Clovis Pu for bipolar disorder.  Just saw him in August 16.  Notes are reviewed.  Current prescribed movement disorder medications: ***Pramipexole 0.5 mg 3 times per day Carbidopa/levodopa 25/100, 1.5 tablets 4 times per day Amantadine 100 mg 3 per day (told to increase from bid)   PREVIOUS MEDICATIONS: Amantadine (patient states that hallucinations and falls but took twice daily dosing for a long time without this and still falls after it)  ALLERGIES:  No Known Allergies  CURRENT MEDICATIONS:  Outpatient Encounter Medications as of 09/28/2019  Medication Sig  . amantadine (SYMMETREL) 100 MG capsule Take 1 capsule (100 mg total) by mouth 3 (three) times daily. (Patient not taking: Reported on 09/18/2019)  . Ascorbic Acid (VITAMIN C) 1000 MG tablet Take 1,000 mg by mouth 2 (two) times daily.   . B Complex-C (B-COMPLEX WITH VITAMIN C)  tablet Take 1 tablet by mouth daily.  . carbidopa-levodopa (SINEMET IR) 25-100 MG tablet TAKE 1 AND 1/2 TABLET BY MOUTH FOUR TIMES A DAY  . Cholecalciferol (VITAMIN D-3) 5000 UNITS TABS Take 1 tablet by mouth daily.  . fenofibrate 160 MG tablet Take 160 mg by mouth daily before breakfast.  . lamoTRIgine (LAMICTAL) 200 MG tablet TAKE ONE TABLET BY MOUTH EVERY NIGHT AT BEDTIME  . LORazepam (ATIVAN) 2 MG tablet 1/2 to 1 tablet daily as needed for panic attacks  . losartan (COZAAR) 50 MG tablet Take 50 mg by mouth daily. Pt. Is unsure of Mg.  . magnesium oxide (MAG-OX) 400 MG tablet Take 400 mg by mouth daily.  . niacin 500 MG tablet Take 500 mg by mouth daily with breakfast.  . Omega-3 Fatty Acids (FISH OIL BURP-LESS PO) Take 1 capsule by mouth in the morning and at bedtime.  . OXcarbazepine (TRILEPTAL) 150 MG tablet TAKE ONE TABLET BY MOUTH TWICE A DAY  . Potassium 99 MG TABS Take 1 tablet by mouth daily.   . pramipexole (MIRAPEX) 0.5 MG tablet TAKE ONE TABLET BY MOUTH THREE TIMES A DAY  . pravastatin (PRAVACHOL) 80 MG tablet Take 80 mg by mouth daily before breakfast.   . QUEtiapine (SEROQUEL XR) 200 MG 24 hr tablet Take 1 tablet (200 mg total) by mouth  at bedtime.  . valsartan (DIOVAN) 160 MG tablet Take 160 mg by mouth daily.   No facility-administered encounter medications on file as of 09/28/2019.    Objective:   PHYSICAL EXAMINATION:    VITALS:  There were no vitals filed for this visit.  GEN:  The patient appears stated age and is in NAD. HEENT:  Normocephalic, atraumatic.  The mucous membranes are moist. The superficial temporal arteries are without ropiness or tenderness. CV:  RRR Lungs:  CTAB Neck/HEME:  There are no carotid bruits bilaterally.  Neurological examination:  Orientation: The patient is alert and oriented x3. Cranial nerves: There is good facial symmetry with*** facial hypomimia. The speech is fluent and clear. Soft palate rises symmetrically and there is no  tongue deviation. Hearing is intact to conversational tone. Sensation: Sensation is intact to light touch throughout Motor: Strength is at least antigravity x4.  Movement examination: Tone: There is ***tone in the *** Abnormal movements: *** Coordination:  There is *** decremation with RAM's, *** Gait and Station: The patient has *** difficulty arising out of a deep-seated chair without the use of the hands. The patient's stride length is ***.  The patient has a *** pull test.     I have reviewed and interpreted the following labs independently    Chemistry      Component Value Date/Time   NA 141 03/30/2016 1219   K 3.7 03/30/2016 1219   CL 107 03/30/2016 1219   CO2 25 03/30/2016 1219   BUN 17 03/30/2016 1219   CREATININE 0.80 03/30/2016 1219      Component Value Date/Time   CALCIUM 9.4 03/30/2016 1219   ALKPHOS 65 03/30/2016 1219   AST 21 03/30/2016 1219   ALT 27 03/30/2016 1219   BILITOT 0.6 03/30/2016 1219       Lab Results  Component Value Date   WBC 5.6 11/26/2014   HGB 12.8 (L) 11/26/2014   HCT 38.8 (L) 11/26/2014   MCV 96.2 11/26/2014   PLT 195.0 11/26/2014    Lab Results  Component Value Date   TSH 2.11 03/30/2016     Total time spent on today's visit was ***30 minutes, including both face-to-face time and nonface-to-face time.  Time included that spent on review of records (prior notes available to me/labs/imaging if pertinent), discussing treatment and goals, answering patient's questions and coordinating care.  Cc:  Burnard Bunting, MD

## 2019-09-28 ENCOUNTER — Ambulatory Visit: Payer: Medicare Other | Admitting: Neurology

## 2019-10-02 ENCOUNTER — Other Ambulatory Visit: Payer: Self-pay | Admitting: Neurology

## 2019-10-02 NOTE — Telephone Encounter (Signed)
Rx(s) sent to pharmacy electronically.  

## 2019-10-16 ENCOUNTER — Telehealth: Payer: Self-pay | Admitting: Neurology

## 2019-10-16 NOTE — Telephone Encounter (Signed)
Spoke with patient and he states he did not want to reschedule his appt he just did not have transportation because he no longer drives. He states he needs to get a walker to get around. I gave patients Dr Doristine Devoid recommendations. He voiced understanding.  Informed patient that it may be best to wait until his appt with Dr [name] that way he can have documentation.  Patient wants to know if he can be seen sooner. Informed him that the appt that he has was the soonest appt we had . He voiced understanding.

## 2019-10-16 NOTE — Telephone Encounter (Signed)
Patient would like a RX for a upright walker  Please call patient

## 2019-10-16 NOTE — Telephone Encounter (Signed)
Doubt insurance will pay for it and they are expensive, esp without notes and he cx his appt last month.  You can try to give him RX but if they decline there won't be anything we can do for that.

## 2019-10-20 ENCOUNTER — Other Ambulatory Visit: Payer: Self-pay | Admitting: Neurology

## 2019-10-23 NOTE — Telephone Encounter (Signed)
Rx(s) sent to pharmacy electronically.  

## 2019-10-26 NOTE — Progress Notes (Signed)
Assessment/Plan:   1.  Parkinsons Disease  -Continue pramipexole 0.5 mg 3 times per day  -increase carbidopa/levodopa 25/100, 2 po at 8am/noon/4pm/8pm  -RX written upright walker.  Discussed with patient that insurance may not pay.  Brochure given to [facility] 2.  Chronic low back pain             -Has seen neurosurgery and Dr. Letta Pate 3.  Depression             -Following with Dr. Clovis Pu.  Last saw him on August 16.  -On Seroquel XR, 200 mg daily, which may contribute to Parkinson symptoms 4.  History of melanoma, stage IIIb             -Following with dermatology  Subjective:   Bill Guerrero was seen today in follow up for Parkinsonism.  My previous records were reviewed prior to todays visit as well as outside records available to me.  .This patient is accompanied in the office by his spouse who supplements the history.Was referred to [facility] last visit and I did review his initial evaluation but nothing thereafter.  He reports that he went but had to stop d/t transportation issues.pt had a fall a few weeks ago down a few steps.  Reports that he has had 10 falls in 6 weeks- had fx ribs and hit head with one fall.     Patient stopped amantadine since last visit, stating that he had a hallucination and falls with the medicine.  The hallucinations did resolve with d/c of amantadine.   His falls did get better after the fall after the d/c of amantadine but still are there.   Mood has been good.  Follows with Dr. Clovis Pu for bipolar disorder.  Just saw him in August 16.  Notes are reviewed.  Pt reports seroquel decreased from 300 to 200 mg due to hangover effect.  Tremor improved since last visit.  Current prescribed movement disorder medications: Pramipexole 0.5 mg 3 times per day Carbidopa/levodopa 25/100, 1.5 tablets 4 times per day (pt states that has trouble getting 4th dose in)   PREVIOUS MEDICATIONS: Amantadine (patient states that hallucinations and falls but took  twice daily dosing for a long time without this and still falls after it)  ALLERGIES:  No Known Allergies  CURRENT MEDICATIONS:  Outpatient Encounter Medications as of 10/27/2019  Medication Sig  . Ascorbic Acid (VITAMIN C) 1000 MG tablet Take 1,000 mg by mouth 2 (two) times daily.   . carbidopa-levodopa (SINEMET IR) 25-100 MG tablet Take 2 tablets by mouth 4 (four) times daily. 8am/noon/4pm/8pm  . fenofibrate 160 MG tablet Take 160 mg by mouth daily before breakfast.  . lamoTRIgine (LAMICTAL) 200 MG tablet TAKE ONE TABLET BY MOUTH EVERY NIGHT AT BEDTIME  . LORazepam (ATIVAN) 2 MG tablet 1/2 to 1 tablet daily as needed for panic attacks  . losartan (COZAAR) 50 MG tablet Take 50 mg by mouth daily. Pt. Is unsure of Mg.  . magnesium oxide (MAG-OX) 400 MG tablet Take 400 mg by mouth daily.  . Omega-3 Fatty Acids (FISH OIL BURP-LESS PO) Take 1 capsule by mouth in the morning and at bedtime.  . OXcarbazepine (TRILEPTAL) 150 MG tablet TAKE ONE TABLET BY MOUTH TWICE A DAY  . Potassium 99 MG TABS Take 1 tablet by mouth daily.   . pramipexole (MIRAPEX) 0.5 MG tablet TAKE ONE TABLET BY MOUTH THREE TIMES A DAY  . pravastatin (PRAVACHOL) 80 MG tablet Take 80 mg  by mouth daily before breakfast.   . QUEtiapine (SEROQUEL XR) 200 MG 24 hr tablet Take 1 tablet (200 mg total) by mouth at bedtime.  . [DISCONTINUED] carbidopa-levodopa (SINEMET IR) 25-100 MG tablet TAKE 1 AND 1/2 TABLET BY MOUTH FOUR TIMES A DAY  . AMBULATORY NON FORMULARY MEDICATION Upright walker Dx: [name]  . B Complex-C (B-COMPLEX WITH VITAMIN C) tablet Take 1 tablet by mouth daily. (Patient not taking: Reported on 10/27/2019)  . Cholecalciferol (VITAMIN D-3) 5000 UNITS TABS Take 1 tablet by mouth daily. (Patient not taking: Reported on 10/27/2019)  . niacin 500 MG tablet Take 500 mg by mouth daily with breakfast. (Patient not taking: Reported on 10/27/2019)  . [DISCONTINUED] amantadine (SYMMETREL) 100 MG capsule Take 1 capsule (100 mg total) by  mouth 3 (three) times daily. (Patient not taking: Reported on 10/27/2019)  . [DISCONTINUED] valsartan (DIOVAN) 160 MG tablet Take 160 mg by mouth daily. (Patient not taking: Reported on 10/27/2019)   No facility-administered encounter medications on file as of 10/27/2019.    Objective:   PHYSICAL EXAMINATION:    VITALS:   Vitals:   10/27/19 1127  BP: 126/64  Pulse: 75  SpO2: 99%  Weight: 288 lb (130.6 kg)  Height: 6\' 1"  (1.854 m)    GEN:  The patient appears stated age and is in NAD. HEENT:  Normocephalic, atraumatic.  The mucous membranes are moist. The superficial temporal arteries are without ropiness or tenderness. CV:  RRR Lungs:  CTAB Neck/HEME:  There are no carotid bruits bilaterally. 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 Cranial nerves: There is good facial symmetry with facial hypomimia. The speech is fluent and clear. Soft palate rises symmetrically and there is no tongue deviation. Hearing is intact to conversational tone. Sensation: Sensation is intact to light touch throughout Motor: Strength is at least antigravity x4.  Movement examination: Tone: There is min increased tone in the RUE Abnormal movements: rare tremor in the R hand (they do state that this is better) Coordination:  There is decremation with RAM's, with any form of RAMS, including alternating supination and pronation of the forearm, hand opening and closing, finger taps, heel taps and toe taps bilaterally Gait and Station: Patient pushes off of the chair to arise.  With the walker, he festinates.  Without the walker, he freezes on the L leg.    I have reviewed and interpreted the following labs independently    Chemistry      Component Value Date/Time   NA 141 03/30/2016 1219   K 3.7 03/30/2016 1219   CL 107 03/30/2016 1219   CO2 25 03/30/2016 1219   BUN 17 03/30/2016 1219   CREATININE 0.80 03/30/2016  1219      Component Value Date/Time   CALCIUM 9.4 03/30/2016 1219   ALKPHOS 65 03/30/2016 1219   AST 21 03/30/2016 1219   ALT 27 03/30/2016 1219   BILITOT 0.6 03/30/2016 1219       Lab Results  Component Value Date   WBC 5.6 11/26/2014   HGB 12.8 (L) 11/26/2014   HCT 38.8 (L) 11/26/2014   MCV 96.2 11/26/2014   PLT 195.0 11/26/2014    Lab Results  Component Value Date   TSH 2.11 03/30/2016     Total time spent on today's visit was 30 minutes, including both face-to-face time and nonface-to-face time.  Time included that spent on review of records (prior notes  available to me/labs/imaging if pertinent), discussing treatment and goals, answering patient's questions and coordinating care.  Cc:  Burnard Bunting, MD

## 2019-10-27 ENCOUNTER — Other Ambulatory Visit: Payer: Self-pay

## 2019-10-27 ENCOUNTER — Encounter: Payer: Self-pay | Admitting: Neurology

## 2019-10-27 ENCOUNTER — Ambulatory Visit (INDEPENDENT_AMBULATORY_CARE_PROVIDER_SITE_OTHER): Payer: Medicare Other | Admitting: Neurology

## 2019-10-27 VITALS — BP 126/64 | HR 75 | Ht 73.0 in | Wt 288.0 lb

## 2019-10-27 DIAGNOSIS — G20 Parkinson's disease: Secondary | ICD-10-CM

## 2019-10-27 MED ORDER — CARBIDOPA-LEVODOPA 25-100 MG PO TABS: 2.0000 | ORAL_TABLET | Freq: Four times a day (QID) | ORAL | 5 refills | Status: DC

## 2019-10-27 MED ORDER — AMBULATORY NON FORMULARY MEDICATION: 0 refills | Status: DC

## 2019-10-27 NOTE — Patient Instructions (Addendum)
1.  increase carbidopa/levodopa 25/100, 2 tablets at 8am/noon/4pm/8pm 2.  Continue pramipexole, 0.5 mg three times per day 3.  Look into [facility] tai chi program  The physicians and staff at [facility] are committed to providing excellent care. You may receive a survey requesting feedback about your experience at our office. We strive to receive "very good" responses to the survey questions. If you feel that your experience would prevent you from giving the office a "very good " response, please contact our office to try to remedy the situation. We may be reached at [phone]. Thank you for taking the time out of your busy day to complete the survey.

## 2019-10-30 DIAGNOSIS — M5030 Other cervical disc degeneration, unspecified cervical region: Secondary | ICD-10-CM | POA: Diagnosis not present

## 2019-10-30 DIAGNOSIS — M5136 Other intervertebral disc degeneration, lumbar region: Secondary | ICD-10-CM | POA: Diagnosis not present

## 2019-11-02 DIAGNOSIS — M5136 Other intervertebral disc degeneration, lumbar region: Secondary | ICD-10-CM | POA: Diagnosis not present

## 2019-11-02 DIAGNOSIS — M62830 Muscle spasm of back: Secondary | ICD-10-CM | POA: Diagnosis not present

## 2019-11-02 DIAGNOSIS — M5030 Other cervical disc degeneration, unspecified cervical region: Secondary | ICD-10-CM | POA: Diagnosis not present

## 2019-11-03 ENCOUNTER — Other Ambulatory Visit: Payer: Self-pay | Admitting: Psychiatry

## 2019-11-03 NOTE — Telephone Encounter (Signed)
Please review

## 2019-12-14 DIAGNOSIS — M62830 Muscle spasm of back: Secondary | ICD-10-CM | POA: Diagnosis not present

## 2019-12-18 ENCOUNTER — Ambulatory Visit: Payer: Medicare Other | Admitting: Neurology

## 2020-01-09 ENCOUNTER — Other Ambulatory Visit: Payer: Self-pay | Admitting: Psychiatry

## 2020-01-18 ENCOUNTER — Other Ambulatory Visit: Payer: Self-pay

## 2020-01-18 ENCOUNTER — Encounter: Payer: Self-pay | Admitting: Psychiatry

## 2020-01-18 ENCOUNTER — Ambulatory Visit (INDEPENDENT_AMBULATORY_CARE_PROVIDER_SITE_OTHER): Payer: Medicare Other | Admitting: Psychiatry

## 2020-01-18 DIAGNOSIS — F5105 Insomnia due to other mental disorder: Secondary | ICD-10-CM | POA: Diagnosis not present

## 2020-01-18 DIAGNOSIS — F3130 Bipolar disorder, current episode depressed, mild or moderate severity, unspecified: Secondary | ICD-10-CM

## 2020-01-18 DIAGNOSIS — F40240 Claustrophobia: Secondary | ICD-10-CM | POA: Diagnosis not present

## 2020-01-18 DIAGNOSIS — F411 Generalized anxiety disorder: Secondary | ICD-10-CM | POA: Diagnosis not present

## 2020-01-18 DIAGNOSIS — F4001 Agoraphobia with panic disorder: Secondary | ICD-10-CM

## 2020-01-18 DIAGNOSIS — G3184 Mild cognitive impairment, so stated: Secondary | ICD-10-CM | POA: Diagnosis not present

## 2020-01-18 MED ORDER — QUETIAPINE FUMARATE ER 150 MG PO TB24: 150.0000 mg | ORAL_TABLET | Freq: Every day | ORAL | 1 refills | Status: DC

## 2020-01-18 MED ORDER — LAMOTRIGINE 200 MG PO TABS
200.0000 mg | ORAL_TABLET | Freq: Every day | ORAL | 1 refills | Status: DC
Start: 2020-01-18 — End: 2021-04-18

## 2020-01-18 MED ORDER — OXCARBAZEPINE 150 MG PO TABS
150.0000 mg | ORAL_TABLET | Freq: Two times a day (BID) | ORAL | 1 refills | Status: DC
Start: 2020-01-18 — End: 2020-08-02

## 2020-01-18 NOTE — Patient Instructions (Signed)
Call mid January if still hungover from Seroquel

## 2020-01-18 NOTE — Progress Notes (Signed)
Bill Guerrero [id number] [birth year] 69 y.o.  Subjective:   Patient ID:  Bill Guerrero is a 69 y.o. (DOB August 16, 1950) male.  Chief Complaint:  Chief Complaint  Patient presents with  . Follow-up  . Depression  . Anxiety    HPI Bill Guerrero presents to the office today for follow-up of mood disorder, anxiety, and chronic insomnia.  He is chronically stressed and down due to the combination of dealing with Parkinson's disease and history of cancer.  Last seen in march 2021.  No meds were changed. The following was noted: Started playing banjo and taking lessons.  Practices daily.   Stress many things he can no longer do physically.  Can't do housework like before.  Getting him down.  Pushing himself to walk with a walker.  Pain in walking without walker.   Has done PT and finished. Accidentally dropped dose of Trileptal for ? Time frame without a problem to 150 mg daily.  No mood swings noticed.   "Made it longer than they thought I would".  Still in remission with melanoma with follow up tomorrow.   But hard to get around.  Still has bad days emotionally up to a week at a time.  Anxiety is worse than depression.  Easily overwhelmed.   Pattern of EMA at 3 am and then falls asleep watching TV.  Become a habit. Then naps.   Occ forgets Seroquel.  09/18/19 appt with the following noted: Bad day with weakness and PD.  Fell yesterday down 3-4 steps. It's  Getting worse. Not getting better on the banjo.   Started [place].  Interest in music daily.   It's so much fun. Hallucinated on amantadine and fell and stopped. Little Ativan. No current treatment for cancer. Questions about timing of quetiapine and gets dizzy and weak Plan: Consider reducing quetiapine ER to minimize SE but risk increase mood problems. Currently seems to be doing ok with depression and mood swings.  Yes bc he gets up at night, REDUCE quetiapine to ER 200 mg nightly.  01/18/20 appt with following  noted: Asks for jury excuse due to Parkinson's Dz with cognitive impairment, alertness and poor memory. Tries to walk every other day.  Latley more back pain has gotten him really down.  Then can think what's the purpose of life.  His family is not supportive.  Wife and son not very helpful.  Had bad feelings about it.  Frustrated with family.  Down for a couple of weeks.  PD progressed last 6 mos and harder at times to move.   Was falling a lot 6 mos ago, but not in the last 6 mos. No difference with reduction in Seroquel and still feels real hung over in the day.  Satisfied with meds.  No other new concerns.  Rarely uses Ativan except when goes to doctors' offices or with CT scans Dt claustrophobia. Pt reports that mood is Anxious and describes anxiety as Moderate. Anxiety symptoms include: Excessive Worry,. Pt reports has interrupted sleep. Pt reports that appetite is good. Pt reports that energy is lethargic and no change. Concentration is down slightly. Suicidal thoughts:  denied by patient.  Less dep and anxiety than last visit. Melanoma responded really well to immunotherapy cancer treatment.    Discussed concerns about the cost of Seroquel Exar and how bad he feels if he runs out of the medication.  PD followed by Dr. Carles Collet dx about 2016 Vaccinated.  Past Psychiatric Medication Trials: Seroquel XR 300  Review of Systems:  Review of Systems  Constitutional: Positive for fatigue.  Cardiovascular: Negative for palpitations.  Musculoskeletal: Positive for arthralgias, back pain and gait problem.  Neurological: Positive for dizziness, tremors, syncope and weakness.       Balance problems from PD.  Psychiatric/Behavioral: Positive for sleep disturbance. Negative for agitation, behavioral problems, confusion, decreased concentration, dysphoric mood, hallucinations, self-injury and suicidal ideas. The patient is nervous/anxious. The patient is not hyperactive.   Frequent falls are a problem.   Can't pick himself up without help.  Medications: I have reviewed the patient's current medications.  Current Outpatient Medications  Medication Sig Dispense Refill  . AMBULATORY NON FORMULARY MEDICATION Upright walker Dx: [name] 1 Device 0  . Ascorbic Acid (VITAMIN C) 1000 MG tablet Take 1,000 mg by mouth 2 (two) times daily.     . B Complex-C (B-COMPLEX WITH VITAMIN C) tablet Take 1 tablet by mouth daily.    . carbidopa-levodopa (SINEMET IR) 25-100 MG tablet Take 2 tablets by mouth 4 (four) times daily. 8am/noon/4pm/8pm 240 tablet 5  . Cholecalciferol (VITAMIN D-3) 5000 UNITS TABS Take 1 tablet by mouth daily.    . fenofibrate 160 MG tablet Take 160 mg by mouth daily before breakfast.    . LORazepam (ATIVAN) 2 MG tablet 1/2 to 1 tablet daily as needed for panic attacks 30 tablet 0  . losartan (COZAAR) 50 MG tablet Take 50 mg by mouth daily. Pt. Is unsure of Mg.    . magnesium oxide (MAG-OX) 400 MG tablet Take 400 mg by mouth daily.    . niacin 500 MG tablet Take 500 mg by mouth daily with breakfast.    . Omega-3 Fatty Acids (FISH OIL BURP-LESS PO) Take 1 capsule by mouth in the morning and at bedtime.    . Potassium 99 MG TABS Take 1 tablet by mouth daily.     . pramipexole (MIRAPEX) 0.5 MG tablet TAKE ONE TABLET BY MOUTH THREE TIMES A DAY 90 tablet 3  . pravastatin (PRAVACHOL) 80 MG tablet Take 80 mg by mouth daily before breakfast.    . lamoTRIgine (LAMICTAL) 200 MG tablet Take 1 tablet (200 mg total) by mouth at bedtime. 90 tablet 1  . OXcarbazepine (TRILEPTAL) 150 MG tablet Take 1 tablet (150 mg total) by mouth 2 (two) times daily. 180 tablet 1  . QUEtiapine Fumarate (SEROQUEL XR) 150 MG 24 hr tablet Take 1 tablet (150 mg total) by mouth at bedtime. 60 tablet 1   No current facility-administered medications for this visit.    Medication Side Effects: None  Allergies: No Known Allergies  Past Medical History:  Diagnosis Date  . Arthritis    hips replacement  . Bipolar 1 disorder  ([place])   . Hyperlipemia   . Hypertension   . 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 Multiple body piercings   . Tremor    RT HAND    Family History  Problem Relation Age of Onset  . Leukemia Mother   . Healthy Sister   . Healthy Sister   . Healthy Son   . Healthy Son   . Healthy Daughter     Social History   Socioeconomic History  . Marital status: Married    Spouse name: Not on file  . Number of children: 3  . Years of education: Not on file  . Highest education level: 12th grade  Occupational History  . Not  on file  Tobacco Use  . Smoking status: Never Smoker  . Smokeless tobacco: Never Used  Vaping Use  . Vaping Use: Never used  Substance and Sexual Activity  . Alcohol use: No    Alcohol/week: 1.0 standard drink    Types: 1 Glasses of wine per week    Comment: no alcohol in 2 years  . Drug use: No  . Sexual activity: Yes  Other Topics Concern  . Not on file  Social History Narrative   Right handed   2 Story home    Lives with spouse and son   Social Determinants of Health   Financial Resource Strain: Not on file  Food Insecurity: Not on file  Transportation Needs: Not on file  Physical Activity: Not on file  Stress: Not on file  Social Connections: Not on file  Intimate Partner Violence: Not on file    Past Medical History, Surgical history, Social history, and Family history were reviewed and updated as appropriate.   Please see review of systems for further details on the patient's review from today.   Objective:   Physical Exam:  There were no vitals taken for this visit.  Physical Exam Constitutional:      General: He is not in acute distress.    Appearance: Normal appearance. He is well-developed. He is obese.  Musculoskeletal:        General: No deformity.  Neurological:     Mental Status: He is alert and oriented to person, place, and time.     Motor: Tremor  present.     Coordination: Coordination abnormal.     Gait: Gait abnormal.     Comments: Uses walker for balance  Psychiatric:        Attention and Perception: Attention and perception normal.        Mood and Affect: Mood is anxious and depressed. Affect is not labile, blunt, angry or inappropriate.        Speech: Speech normal. Speech is not slurred.        Behavior: Behavior is slowed.        Thought Content: Thought content normal. Thought content is not paranoid. Thought content does not include homicidal or suicidal ideation. Thought content does not include homicidal or suicidal plan.        Cognition and Memory: Cognition normal.        Judgment: Judgment normal.     Comments: Insight intact. No auditory or visual hallucinations. No delusions.  Less preoccupation with health. Depression goes up and down with health and situation.     Lab Review:     Component Value Date/Time   NA 141 03/30/2016 1219   K 3.7 03/30/2016 1219   CL 107 03/30/2016 1219   CO2 25 03/30/2016 1219   GLUCOSE 97 03/30/2016 1219   BUN 17 03/30/2016 1219   CREATININE 0.80 03/30/2016 1219   CALCIUM 9.4 03/30/2016 1219   PROT 6.9 03/30/2016 1219   ALBUMIN 4.7 03/30/2016 1219   AST 21 03/30/2016 1219   ALT 27 03/30/2016 1219   ALKPHOS 65 03/30/2016 1219   BILITOT 0.6 03/30/2016 1219   GFRNONAA 86 (L) 10/06/2012 1130   GFRAA >90 10/06/2012 1130       Component Value Date/Time   WBC 5.6 11/26/2014 1133   RBC 4.04 (L) 11/26/2014 1133   HGB 12.8 (L) 11/26/2014 1133   HCT 38.8 (L) 11/26/2014 1133   PLT 195.0 11/26/2014 1133   MCV 96.2 11/26/2014 1133  MCH 31.9 10/06/2012 1130   MCHC 33.0 11/26/2014 1133   RDW 13.6 11/26/2014 1133   LYMPHSABS 1.1 11/26/2014 1133   MONOABS 0.6 11/26/2014 1133   EOSABS 0.1 11/26/2014 1133   BASOSABS 0.0 11/26/2014 1133    Lithium Lvl  Date Value Ref Range Status  08/17/2010 0.44 (L) 0.80 - 1.40 mEq/L Final     No results found for: PHENYTOIN, PHENOBARB,  VALPROATE, CBMZ   .res Assessment: Plan:    Bipolar I disorder, most recent episode depressed ([place]) - Plan: QUEtiapine Fumarate (SEROQUEL XR) 150 MG 24 hr tablet, lamoTRIgine (LAMICTAL) 200 MG tablet, OXcarbazepine (TRILEPTAL) 150 MG tablet  Generalized anxiety disorder  Claustrophobia  Panic disorder with agoraphobia  Insomnia due to mental condition  Mild cognitive impairment   Disc balancing benefit vs SE of Seroquel in view of Parkinson's Dz.   Discussed potential metabolic side effects associated with atypical antipsychotics, as well as potential risk for movement side effects. Advised pt to contact office if movement side effects occur.  Consider reducing quetiapine ER to minimize SE but risk increase mood problems. Currently seems to be doing ok with depression and mood swings.  Yes bc he gets up at night, Apparently no worse with reduction quetiapine to ER 200 mg nightly,  DT drowsiness reduce again to 150 mg ER HS Take it earlier if you can to reduce hangover. Call if more anxiety or mood swings after reducing Trileptal.  It's a very low dose.  Unlikely increase in meds will help the situational depression. Disc pain management and answered questions of opiate use.  May need a pain clinic. Disc fall risk with pain meds.  Anxiety is not fully manageable but med changes unlikely to help.  Overall doing OK.  Supportive therapy and health direction given severe dx PD and malignant melanoma.  And dealing with family's lack of attention.  Enc cont exercise which is helping.  Disc risk of PD meds affecting psych status including  Risk of PD.  Chronic insomnia is stable.  We discussed the short-term risks associated with benzodiazepines including sedation and increased fall risk among others.  Discussed long-term side effect risk including dependence, potential withdrawal symptoms, and the potential eventual dose-related risk of dementia.  He is not taking much of this.  Disc  costs of Rx and how to manage for ex with GoodRx he forgot to look into this and he was reminded about it today.  He was given written information about it.   Patient agrees with this plan.  Call mid Jan and if still hung over but otherwise mood ok will try reducing it again DT SE Seroquel.  Lynder Parents, MD, DFAPA     Please see After Visit Summary for patient specific instructions.  Future Appointments  Date Time Provider [place]  04/26/2020 11:15 AM [name], [name], DO [facility] None    No orders of the defined types were placed in this encounter.     -------------------------------

## 2020-01-22 DIAGNOSIS — Z23 Encounter for immunization: Secondary | ICD-10-CM | POA: Diagnosis not present

## 2020-02-04 ENCOUNTER — Other Ambulatory Visit: Payer: Self-pay | Admitting: Neurology

## 2020-02-09 DIAGNOSIS — M62830 Muscle spasm of back: Secondary | ICD-10-CM | POA: Diagnosis not present

## 2020-02-12 ENCOUNTER — Telehealth: Payer: Self-pay | Admitting: Neurology

## 2020-02-12 MED ORDER — PRAMIPEXOLE DIHYDROCHLORIDE 0.5 MG PO TABS: 0.5000 mg | ORAL_TABLET | Freq: Three times a day (TID) | ORAL | 0 refills | Status: DC

## 2020-02-12 NOTE — Telephone Encounter (Signed)
Patient's wife called in stating the patient's pharmacy says they have not heard from [place] about refilling his Mirapex. They need a refill sent to [facility] on [address] in [place].

## 2020-02-12 NOTE — Telephone Encounter (Signed)
Spoke with pharmacist who stated they never received the rx. Verbal order given.   Rx(s) sent to pharmacy electronically.  Patient notified directly and voiced understanding.

## 2020-02-12 NOTE — Telephone Encounter (Signed)
It was sent to pharmacy on 02/05/20 with receipt confirmed by pharmacy.  Will you call pharmacy and find out what is going on?

## 2020-02-22 ENCOUNTER — Other Ambulatory Visit: Payer: Self-pay | Admitting: Psychiatry

## 2020-02-22 DIAGNOSIS — F4001 Agoraphobia with panic disorder: Secondary | ICD-10-CM

## 2020-03-07 ENCOUNTER — Other Ambulatory Visit: Payer: Self-pay | Admitting: Neurology

## 2020-03-08 DIAGNOSIS — M47816 Spondylosis without myelopathy or radiculopathy, lumbar region: Secondary | ICD-10-CM | POA: Diagnosis not present

## 2020-03-08 NOTE — Telephone Encounter (Signed)
Rx(s) sent to pharmacy electronically.  

## 2020-03-20 ENCOUNTER — Ambulatory Visit: Payer: Medicare Other | Admitting: Psychiatry

## 2020-04-05 ENCOUNTER — Telehealth: Payer: Self-pay | Admitting: Neurology

## 2020-04-05 NOTE — Telephone Encounter (Signed)
Spoke with patients wife and gave her Dr Doristine Devoid recommendations. She voiced understanding

## 2020-04-05 NOTE — Telephone Encounter (Signed)
Patient is having problems with the tremors being really bad. The patient wife called and states that he is taking his medication every 4 hours like clock work and it does not seem like it is enough, he called her into the room the other night and states his insides were shaking so bad like his outside does and he thought he was dying.   He has appt on 04-26-20 with [name]  Please call

## 2020-04-05 NOTE — Telephone Encounter (Signed)
Will address at f/u in few weeks, but there may not be a lot more we can do for the tremor.  He is not a surgical DBS candidate unfortunately due to depression/mood.

## 2020-04-19 DIAGNOSIS — J069 Acute upper respiratory infection, unspecified: Secondary | ICD-10-CM | POA: Diagnosis not present

## 2020-04-25 NOTE — Progress Notes (Signed)
Assessment/Plan:   1.  Parkinsons Disease  -Continue pramipexole 0.5 mg 3 times per day  -Continue carbidopa/levodopa 25/100, 2 po at 8am/noon/4pm/8pm.  I told him that I do not want him to take extra dosages in the middle of the night like he is currently doing.  -add carbidopa/levodopa 50/200 CR at bedtime  -In 2 weeks, we will add entacapone, 200 mg, 1 tablet to the first 3 dosages of his levodopa. 2.  Chronic low back pain             -Has seen neurosurgery and Dr. Letta Pate  -following now with Dr. Rolena Infante and Dr. Nelva Bush with [facility] 3.  Depression             -Following with Dr. Clovis Pu.  Last saw him on August 16.  -On Seroquel XR, 150 mg daily, which may contribute to Parkinson symptoms 4.  History of melanoma, stage IIIb             -Following with dermatology  Subjective:   Bill Guerrero was seen today in follow up for Parkinsonism.  My previous records were reviewed prior to todays visit as well as outside records available to me.  This patient is accompanied in the office by his son who supplements the history.  levodopa increased last visit.  Wife called just a few weeks ago stating that patient's tremors were increasing.  Discussed that I was not sure that there was much more that we could do given that he is not a surgical candidate because of his bipolar disorder.  He last saw Dr. Clovis Pu January 18, 2020.  Notes indicate that patient is "easily overwhelmed."  Dr. Clovis Pu has been working on reducing the quetiapine due to drowsiness.  He dropped it from 200 mg to 150 mg.  He also noted that anxiety was not fully manageable, but felt that medication changes were unlikely to help.  No hallucinations.  Rare lightheadedness.  No near syncope.  Not exercising much.  Quit going to PT due to transportation issues.    Current prescribed movement disorder medications: Pramipexole 0.5 mg 3 times per day Carbidopa/levodopa 25/100, 2 tablets at 8 AM/noon/4 PM/8 PM (increased  last visit) - pt states that he turns "off" after 3 hours.  States that he is actually taking an additional dosage at 3am.   PREVIOUS MEDICATIONS: Amantadine (patient states that hallucinations and falls but took twice daily dosing for a long time without this and still falls after it)  ALLERGIES:  No Known Allergies  CURRENT MEDICATIONS:  Outpatient Encounter Medications as of 04/26/2020  Medication Sig  . AMBULATORY NON FORMULARY MEDICATION Upright walker Dx: [name]  . carbidopa-levodopa (SINEMET IR) 25-100 MG tablet Take 2 tablets by mouth 4 (four) times daily. 8am/noon/4pm/8pm  . lamoTRIgine (LAMICTAL) 200 MG tablet Take 1 tablet (200 mg total) by mouth at bedtime.  Marland Kitchen LORazepam (ATIVAN) 2 MG tablet TAKE 1/2 TO 1 TABLET BY MOUTH DAILY AS NEEDED FOR PANIC ATTACKS  . losartan (COZAAR) 50 MG tablet Take 50 mg by mouth daily. Pt. Is unsure of Mg.  . OXcarbazepine (TRILEPTAL) 150 MG tablet Take 1 tablet (150 mg total) by mouth 2 (two) times daily.  . pramipexole (MIRAPEX) 0.5 MG tablet TAKE ONE TABLET BY MOUTH THREE TIMES A DAY  . pravastatin (PRAVACHOL) 80 MG tablet Take 80 mg by mouth daily before breakfast.  . QUEtiapine Fumarate (SEROQUEL XR) 150 MG 24 hr tablet Take 1 tablet (150 mg total) by  mouth at bedtime.  . Ascorbic Acid (VITAMIN C) 1000 MG tablet Take 1,000 mg by mouth 2 (two) times daily.  (Patient not taking: Reported on 04/26/2020)  . B Complex-C (B-COMPLEX WITH VITAMIN C) tablet Take 1 tablet by mouth daily. (Patient not taking: Reported on 04/26/2020)  . Cholecalciferol (VITAMIN D-3) 5000 UNITS TABS Take 1 tablet by mouth daily. (Patient not taking: Reported on 04/26/2020)  . magnesium oxide (MAG-OX) 400 MG tablet Take 400 mg by mouth daily. (Patient not taking: Reported on 04/26/2020)  . niacin 500 MG tablet Take 500 mg by mouth daily with breakfast. (Patient not taking: Reported on 04/26/2020)  . Omega-3 Fatty Acids (FISH OIL BURP-LESS PO) Take 1 capsule by mouth in the morning and  at bedtime. (Patient not taking: Reported on 04/26/2020)  . Potassium 99 MG TABS Take 1 tablet by mouth daily.  (Patient not taking: Reported on 04/26/2020)  . [DISCONTINUED] fenofibrate 160 MG tablet Take 160 mg by mouth daily before breakfast. (Patient not taking: Reported on 04/26/2020)   No facility-administered encounter medications on file as of 04/26/2020.    Objective:   PHYSICAL EXAMINATION:    VITALS:   Vitals:   04/26/20 1104  BP: (!) 146/80  Pulse: 82  SpO2: 96%  Weight: 292 lb (132.5 kg)  Height: 6\' 1"  (1.854 m)    GEN:  The patient appears stated age and is in NAD. HEENT:  Normocephalic, atraumatic.  The mucous membranes are moist. The superficial temporal arteries are without ropiness or tenderness. CV:  RRR Lungs:  CTAB.  Some DOE   Neurological examination:  Orientation: The patient is alert and oriented x3. Cranial nerves: There is good facial symmetry with facial hypomimia. The speech is fluent and clear. Soft palate rises symmetrically and there is no tongue deviation. Hearing is intact to conversational tone. Sensation: Sensation is intact to light touch throughout Motor: Strength is at least antigravity x4.  Movement examination: Tone: There is min increased tone in the RUE Abnormal movements: mild tremor in the bilateral UE; mild dyskinesia in the L foot Coordination:  There is decremation with RAM's, with any form of RAMS, including alternating supination and pronation of the forearm, hand opening and closing, finger taps, heel taps and toe taps bilaterally Gait and Station: Patient pushes off of the chair to arise.  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  He ambulates fairly well with the walker but he does have trouble with the turn   I have reviewed and interpreted the following labs independently    Chemistry      Component Value Date/Time   NA 141 03/30/2016 1219   K 3.7 03/30/2016 1219   CL 107 03/30/2016 1219   CO2 25 03/30/2016 1219   BUN 17  03/30/2016 1219   CREATININE 0.80 03/30/2016 1219      Component Value Date/Time   CALCIUM 9.4 03/30/2016 1219   ALKPHOS 65 03/30/2016 1219   AST 21 03/30/2016 1219   ALT 27 03/30/2016 1219   BILITOT 0.6 03/30/2016 1219       Lab Results  Component Value Date   WBC 5.6 11/26/2014   HGB 12.8 (L) 11/26/2014   HCT 38.8 (L) 11/26/2014   MCV 96.2 11/26/2014   PLT 195.0 11/26/2014    Lab Results  Component Value Date   TSH 2.11 03/30/2016     Total time spent on today's visit was 30 minutes, including both face-to-face time and nonface-to-face time.  Time included that spent on  review of records (prior notes available to me/labs/imaging if pertinent), discussing treatment and goals, answering patient's questions and coordinating care.  Cc:  Burnard Bunting, MD

## 2020-04-26 ENCOUNTER — Encounter: Payer: Self-pay | Admitting: Neurology

## 2020-04-26 ENCOUNTER — Ambulatory Visit (INDEPENDENT_AMBULATORY_CARE_PROVIDER_SITE_OTHER): Payer: Medicare Other | Admitting: Neurology

## 2020-04-26 ENCOUNTER — Other Ambulatory Visit: Payer: Self-pay

## 2020-04-26 VITALS — BP 146/80 | HR 82 | Ht 73.0 in | Wt 292.0 lb

## 2020-04-26 DIAGNOSIS — G20 Parkinson's disease: Secondary | ICD-10-CM

## 2020-04-26 MED ORDER — CARBIDOPA-LEVODOPA ER 50-200 MG PO TBCR: 1.0000 | EXTENDED_RELEASE_TABLET | Freq: Every day | ORAL | 1 refills | Status: DC

## 2020-04-26 MED ORDER — ENTACAPONE 200 MG PO TABS: 200.0000 mg | ORAL_TABLET | Freq: Three times a day (TID) | ORAL | 1 refills | Status: DC

## 2020-04-26 NOTE — Patient Instructions (Signed)
1.  Take carbidopa/levodopa 25/100, 2 tablets at 8am/noon/4pm/8pm 2.  Continue pramipexole 0.5 mg three times per day 3.  ADD carbidopa/levodopa 50/200 at bedtime (do not take carbidopa/levodopa 25/100 in the middle of the night any longer) 4.  In 2 weeks, start entacapone, 200 mg, 1 tablet with your 8am/noon/4pm dosage of levodopa

## 2020-04-29 ENCOUNTER — Other Ambulatory Visit: Payer: Self-pay | Admitting: Neurology

## 2020-05-01 ENCOUNTER — Ambulatory Visit (INDEPENDENT_AMBULATORY_CARE_PROVIDER_SITE_OTHER): Payer: Medicare Other | Admitting: Psychiatry

## 2020-05-01 ENCOUNTER — Other Ambulatory Visit: Payer: Self-pay

## 2020-05-01 ENCOUNTER — Encounter: Payer: Self-pay | Admitting: Psychiatry

## 2020-05-01 ENCOUNTER — Telehealth: Payer: Self-pay | Admitting: Neurology

## 2020-05-01 DIAGNOSIS — F411 Generalized anxiety disorder: Secondary | ICD-10-CM | POA: Diagnosis not present

## 2020-05-01 DIAGNOSIS — F40240 Claustrophobia: Secondary | ICD-10-CM | POA: Diagnosis not present

## 2020-05-01 DIAGNOSIS — F3130 Bipolar disorder, current episode depressed, mild or moderate severity, unspecified: Secondary | ICD-10-CM

## 2020-05-01 DIAGNOSIS — G3184 Mild cognitive impairment, so stated: Secondary | ICD-10-CM | POA: Diagnosis not present

## 2020-05-01 DIAGNOSIS — F4001 Agoraphobia with panic disorder: Secondary | ICD-10-CM | POA: Diagnosis not present

## 2020-05-01 DIAGNOSIS — F5105 Insomnia due to other mental disorder: Secondary | ICD-10-CM | POA: Diagnosis not present

## 2020-05-01 NOTE — Telephone Encounter (Signed)
Patient's wife called in stating she thinks there is a mix up with the refill for the patient's pramipexole.  The patient is out of his medication and needs a refill. Kristopher Oppenheim won't refill with out [place] sending something.

## 2020-05-01 NOTE — Progress Notes (Signed)
Bill Guerrero [id number] 11-27-1950 70 y.o.  Subjective:   Patient ID:  Bill Guerrero is a 70 y.o. (DOB 07/27/1950) male.  Chief Complaint:  Chief Complaint  Patient presents with  . Follow-up  . Bipolar I disorder, most recent episode depressed ([place])    HPI [name] presents to the office today for follow-up of mood disorder, anxiety, and chronic insomnia.  He is chronically stressed and down due to the combination of dealing with Parkinson's disease and history of cancer.  Last seen in march 2021.  No meds were changed. The following was noted: Started playing banjo and taking lessons.  Practices daily.   Stress many things he can no longer do physically.  Can't do housework like before.  Getting him down.  Pushing himself to walk with a walker.  Pain in walking without walker.   Has done PT and finished. Accidentally dropped dose of Trileptal for ? Time frame without a problem to 150 mg daily.  No mood swings noticed.   "Made it longer than they thought I would".  Still in remission with melanoma with follow up tomorrow.   But hard to get around.  Still has bad days emotionally up to a week at a time.  Anxiety is worse than depression.  Easily overwhelmed.   Pattern of EMA at 3 am and then falls asleep watching TV.  Become a habit. Then naps.   Occ forgets Seroquel.  09/18/19 appt with the following noted: Bad day with weakness and PD.  Fell yesterday down 3-4 steps. It's  Getting worse. Not getting better on the banjo.   Started [place].  Interest in music daily.   It's so much fun. Hallucinated on amantadine and fell and stopped. Little Ativan. No current treatment for cancer. Questions about timing of quetiapine and gets dizzy and weak Plan: Consider reducing quetiapine ER to minimize SE but risk increase mood problems. Currently seems to be doing ok with depression and mood swings.  Yes bc he gets up at night, REDUCE quetiapine to ER 200 mg  nightly.  01/18/20 appt with following noted: Asks for jury excuse due to Parkinson's Dz with cognitive impairment, alertness and poor memory. Tries to walk every other day.  Latley more back pain has gotten him really down.  Then can think what's the purpose of life.  His family is not supportive.  Wife and son not very helpful.  Had bad feelings about it.  Frustrated with family.  Down for a couple of weeks.  PD progressed last 6 mos and harder at times to move.   Was falling a lot 6 mos ago, but not in the last 6 mos. No difference with reduction in Seroquel and still feels real hung over in the day. Plan: Apparently no worse with reduction quetiapine to ER 200 mg nightly,  DT drowsiness reduce again to 150 mg ER HS  05/01/2020 appointment with the following noted:  More anxiety, but no worsening manic sx.  Sleep MN to 3 AM and then after a few days sleep more.  Same pattern as 6 mos or longer.  No one else notices any changes with reduction.  He notices no changes with PD.  The off times with PD have been more abrupt.  Takes a long time to do things.  Family just thinks he's slow. No changes with mood. 10 years ago slept 8 hour night.  Not that sleepy during the day lately. Rare lorazepam.  Satisfied with meds.  No other new concerns.  Rarely uses Ativan except when goes to doctors' offices or with CT scans Dt claustrophobia. Pt reports that mood is Anxious and describes anxiety as Moderate. Anxiety symptoms include: Excessive Worry,. Pt reports has interrupted sleep. Pt reports that appetite is good. Pt reports that energy is lethargic and no change. Concentration is down slightly. Suicidal thoughts:  denied by patient.  Less dep and anxiety than last visit. Melanoma responded really well to immunotherapy cancer treatment.    Discussed concerns about the cost of Seroquel Exar and how bad he feels if he runs out of the medication.  PD followed by Dr. Carles Collet dx about 2016 Vaccinated.  Past  Psychiatric Medication Trials: Seroquel XR 300  Review of Systems:  Review of Systems  Constitutional: Positive for fatigue.  Cardiovascular: Negative for chest pain and palpitations.  Musculoskeletal: Positive for arthralgias, back pain and gait problem.  Neurological: Positive for dizziness, tremors, syncope and weakness.       Balance problems from PD.  Psychiatric/Behavioral: Positive for sleep disturbance. Negative for agitation, behavioral problems, confusion, decreased concentration, dysphoric mood, hallucinations, self-injury and suicidal ideas. The patient is nervous/anxious. The patient is not hyperactive.   Frequent falls are a problem.  Can't pick himself up without help.  Medications: I have reviewed the patient's current medications.  Current Outpatient Medications  Medication Sig Dispense Refill  . AMBULATORY NON FORMULARY MEDICATION Upright walker Dx: [name] 1 Device 0  . carbidopa-levodopa (SINEMET IR) 25-100 MG tablet Take 2 tablets by mouth 4 (four) times daily. 8am/noon/4pm/8pm 240 tablet 5  . lamoTRIgine (LAMICTAL) 200 MG tablet Take 1 tablet (200 mg total) by mouth at bedtime. 90 tablet 1  . LORazepam (ATIVAN) 2 MG tablet TAKE 1/2 TO 1 TABLET BY MOUTH DAILY AS NEEDED FOR PANIC ATTACKS 30 tablet 0  . losartan (COZAAR) 50 MG tablet Take 50 mg by mouth daily. Pt. Is unsure of Mg.    . OXcarbazepine (TRILEPTAL) 150 MG tablet Take 1 tablet (150 mg total) by mouth 2 (two) times daily. (Patient taking differently: Take 150 mg by mouth daily.) 180 tablet 1  . pramipexole (MIRAPEX) 0.5 MG tablet TAKE ONE TABLET BY MOUTH THREE TIMES A DAY 90 tablet 1  . pravastatin (PRAVACHOL) 80 MG tablet Take 80 mg by mouth daily before breakfast.    . QUEtiapine Fumarate (SEROQUEL XR) 150 MG 24 hr tablet Take 1 tablet (150 mg total) by mouth at bedtime. 60 tablet 1  . Ascorbic Acid (VITAMIN C) 1000 MG tablet Take 1,000 mg by mouth 2 (two) times daily.  (Patient not taking: No sig reported)    .  B Complex-C (B-COMPLEX WITH VITAMIN C) tablet Take 1 tablet by mouth daily. (Patient not taking: No sig reported)    . carbidopa-levodopa (SINEMET CR) 50-200 MG tablet Take 1 tablet by mouth at bedtime. (Patient not taking: Reported on 05/01/2020) 90 tablet 1  . Cholecalciferol (VITAMIN D-3) 5000 UNITS TABS Take 1 tablet by mouth daily. (Patient not taking: No sig reported)    . entacapone (COMTAN) 200 MG tablet Take 1 tablet (200 mg total) by mouth 3 (three) times daily. Take with 8am/noon/4pm dosage of levodopa (Patient not taking: Reported on 05/01/2020) 270 tablet 1  . magnesium oxide (MAG-OX) 400 MG tablet Take 400 mg by mouth daily. (Patient not taking: No sig reported)    . niacin 500 MG tablet Take 500 mg by mouth daily with breakfast. (Patient not taking: No sig reported)    .  Omega-3 Fatty Acids (FISH OIL BURP-LESS PO) Take 1 capsule by mouth in the morning and at bedtime. (Patient not taking: No sig reported)    . Potassium 99 MG TABS Take 1 tablet by mouth daily.  (Patient not taking: No sig reported)     No current facility-administered medications for this visit.    Medication Side Effects: None  Allergies: No Known Allergies  Past Medical History:  Diagnosis Date  . Arthritis    hips replacement  . Bipolar 1 disorder ([place])   . Hyperlipemia   . Hypertension   . 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 Multiple body piercings   . Tremor    RT HAND    Family History  Problem Relation Age of Onset  . Leukemia Mother   . Healthy Sister   . Healthy Sister   . Healthy Son   . Healthy Son   . Healthy Daughter     Social History   Socioeconomic History  . Marital status: Married    Spouse name: Not on file  . Number of children: 3  . Years of education: Not on file  . Highest education level: 12th grade  Occupational History  . Not on file  Tobacco Use  . Smoking status: Never Smoker  .  Smokeless tobacco: Never Used  Vaping Use  . Vaping Use: Never used  Substance and Sexual Activity  . Alcohol use: No    Alcohol/week: 1.0 standard drink    Types: 1 Glasses of wine per week    Comment: no alcohol in 2 years  . Drug use: No  . Sexual activity: Yes  Other Topics Concern  . Not on file  Social History Narrative   Right handed   2 Story home    Lives with spouse and son   Social Determinants of Health   Financial Resource Strain: Not on file  Food Insecurity: Not on file  Transportation Needs: Not on file  Physical Activity: Not on file  Stress: Not on file  Social Connections: Not on file  Intimate Partner Violence: Not on file    Past Medical History, Surgical history, Social history, and Family history were reviewed and updated as appropriate.   Please see review of systems for further details on the patient's review from today.   Objective:   Physical Exam:  There were no vitals taken for this visit.  Physical Exam Constitutional:      General: He is not in acute distress.    Appearance: Normal appearance. He is well-developed. He is obese.  Musculoskeletal:        General: No deformity.  Neurological:     Mental Status: He is alert and oriented to person, place, and time.     Motor: Tremor present.     Coordination: Coordination abnormal.     Gait: Gait abnormal.     Comments: Uses walker for balance  Psychiatric:        Attention and Perception: Attention and perception normal.        Mood and Affect: Mood is anxious and depressed. Affect is not labile, blunt, angry or inappropriate.        Speech: Speech normal. Speech is not slurred.        Behavior: Behavior is slowed.        Thought Content: Thought content normal. Thought content is not paranoid. Thought content does not include  homicidal or suicidal ideation. Thought content does not include homicidal or suicidal plan.        Cognition and Memory: Cognition normal.        Judgment:  Judgment normal.     Comments: Insight intact. No auditory or visual hallucinations. No delusions.  Depression goes up and down with health and situation.     Lab Review:     Component Value Date/Time   NA 141 03/30/2016 1219   K 3.7 03/30/2016 1219   CL 107 03/30/2016 1219   CO2 25 03/30/2016 1219   GLUCOSE 97 03/30/2016 1219   BUN 17 03/30/2016 1219   CREATININE 0.80 03/30/2016 1219   CALCIUM 9.4 03/30/2016 1219   PROT 6.9 03/30/2016 1219   ALBUMIN 4.7 03/30/2016 1219   AST 21 03/30/2016 1219   ALT 27 03/30/2016 1219   ALKPHOS 65 03/30/2016 1219   BILITOT 0.6 03/30/2016 1219   GFRNONAA 86 (L) 10/06/2012 1130   GFRAA >90 10/06/2012 1130       Component Value Date/Time   WBC 5.6 11/26/2014 1133   RBC 4.04 (L) 11/26/2014 1133   HGB 12.8 (L) 11/26/2014 1133   HCT 38.8 (L) 11/26/2014 1133   PLT 195.0 11/26/2014 1133   MCV 96.2 11/26/2014 1133   MCH 31.9 10/06/2012 1130   MCHC 33.0 11/26/2014 1133   RDW 13.6 11/26/2014 1133   LYMPHSABS 1.1 11/26/2014 1133   MONOABS 0.6 11/26/2014 1133   EOSABS 0.1 11/26/2014 1133   BASOSABS 0.0 11/26/2014 1133    Lithium Lvl  Date Value Ref Range Status  08/17/2010 0.44 (L) 0.80 - 1.40 mEq/L Final     No results found for: PHENYTOIN, PHENOBARB, VALPROATE, CBMZ   .res Assessment: Plan:    Bipolar I disorder, most recent episode depressed ([place])  Generalized anxiety disorder  Claustrophobia  Panic disorder with agoraphobia  Insomnia due to mental condition  Mild cognitive impairment   Disc balancing benefit vs SE of Seroquel in view of Parkinson's Dz.   Discussed potential metabolic side effects associated with atypical antipsychotics, as well as potential risk for movement side effects. Advised pt to contact office if movement side effects occur.   Currently seems to be doing ok with depression and mood swings.   Apparently no worse with reduction quetiapine to 150 mg ER HS . Continue it. Less drowsiness now  Call  if more anxiety or mood swings after reducing Trileptal.  It's a very low dose. DT hepatic induction  Trial weaning off oxcarbazepine.  Call if sleep worsens or mania. Reduce oxcarbazepine to 1/2 tablet at night for 2 weeks and if sleep is unchanged then stop it.  Unlikely increase in meds will help the situational depression. Disc pain management and answered questions of opiate use.  May need a pain clinic. Disc fall risk with pain meds.  Anxiety is not fully manageable but med changes unlikely to help.  Overall doing OK.  Supportive therapy and health direction given severe dx PD and malignant melanoma.  And dealing with family's lack of attention.  Enc cont exercise which is helping.  Disc risk of PD meds affecting psych status including  Risk of PD.  Chronic insomnia is stable.  We discussed the short-term risks associated with benzodiazepines including sedation and increased fall risk among others.  Discussed long-term side effect risk including dependence, potential withdrawal symptoms, and the potential eventual dose-related risk of dementia.  He is not taking much of this.  Disc costs of Rx and  how to manage for ex with GoodRx he forgot to look into this and he was reminded about it today.  He was given written information about it.   Patient agrees with this plan.  2-3 mos  Lynder Parents, MD, DFAPA     Please see After Visit Summary for patient specific instructions.  Future Appointments  Date Time Provider [place]  10/31/2020  1:00 PM [name], [name], DO [id number] None    No orders of the defined types were placed in this encounter.     -------------------------------

## 2020-05-01 NOTE — Patient Instructions (Signed)
Reduce oxcarbazepine to 1/2 tablet at night for 2 weeks and if sleep is unchanged then stop it.

## 2020-05-02 MED ORDER — PRAMIPEXOLE DIHYDROCHLORIDE 0.5 MG PO TABS: 0.5000 mg | ORAL_TABLET | Freq: Three times a day (TID) | ORAL | 1 refills | Status: DC

## 2020-05-02 NOTE — Telephone Encounter (Signed)
Spoke with wife who states the patient has been out of the medication for a few days. I told her I have not received anything from the pharmacy requesting a refill.   Rx(s) sent to pharmacy electronically.  Wife thanked me for a call back and for sending the prescription to the pharmacy.

## 2020-05-10 ENCOUNTER — Other Ambulatory Visit: Payer: Self-pay | Admitting: Neurology

## 2020-05-13 NOTE — Telephone Encounter (Signed)
Rx(s) sent to pharmacy electronically.  

## 2020-05-23 ENCOUNTER — Other Ambulatory Visit: Payer: Self-pay | Admitting: Psychiatry

## 2020-05-23 DIAGNOSIS — F3130 Bipolar disorder, current episode depressed, mild or moderate severity, unspecified: Secondary | ICD-10-CM

## 2020-06-18 ENCOUNTER — Telehealth: Payer: Self-pay | Admitting: Psychiatry

## 2020-06-18 NOTE — Telephone Encounter (Signed)
At the last visit March 30 we made the following med change: Reduce oxcarbazepine to 1/2 tablet at night for 2 weeks and if sleep is unchanged then stop it.  This could lead to increased anxiety and potentially even mania.  If he is having mania please let me know.  We made this change in order to attempt to reduce drug interaction problems with his Parkinson's disease medicines.  Rather than restarting the oxcarbazepine it would be better to have him add Ativan 0.5 to 1 mg nightly if his problems are primarily with sleep and he can take the lorazepam every 8 hours as needed anxiety as well if needed.

## 2020-06-18 NOTE — Telephone Encounter (Signed)
Next visit is 07/02/20.Steve's wife Charlesetta Ivory called. She is on the [facility] list to speak to. She said Bill Guerrero has increased anxiety and panic attacks at night. Wondering if these side effects have something to do with the changes in his medications on last visit? Phone number is [phone].

## 2020-06-18 NOTE — Telephone Encounter (Signed)
Please review

## 2020-06-20 ENCOUNTER — Other Ambulatory Visit: Payer: Self-pay | Admitting: Psychiatry

## 2020-06-20 DIAGNOSIS — F3130 Bipolar disorder, current episode depressed, mild or moderate severity, unspecified: Secondary | ICD-10-CM

## 2020-06-20 DIAGNOSIS — F4001 Agoraphobia with panic disorder: Secondary | ICD-10-CM

## 2020-06-21 ENCOUNTER — Other Ambulatory Visit: Payer: Self-pay | Admitting: Psychiatry

## 2020-06-21 DIAGNOSIS — F4001 Agoraphobia with panic disorder: Secondary | ICD-10-CM

## 2020-06-21 MED ORDER — LORAZEPAM 2 MG PO TABS
ORAL_TABLET | ORAL | 0 refills | Status: DC
Start: 2020-06-21 — End: 2020-07-11

## 2020-06-21 NOTE — Telephone Encounter (Signed)
Bill Guerrero called back and discussed his symptoms, he reports mainly at nighttime, very restless with trouble sleeping. High anxiety is what he reports. As far as the Ativan, he already uses that infrequently but needs a refill. The rx on his chart is Ativan 2 mg though.  Bill Guerrero asked if you could send a Rx  his usual Ativan is 2 mg, take 1/2 -1 tablet as needed for panic attacks. He has been using that since stopping the Oxcarbazepine. Please update

## 2020-06-21 NOTE — Telephone Encounter (Signed)
Rx sent for pt to take up to Ativan twice daily.  We need to watch for manic sx after weaning off Trileptal.

## 2020-06-21 NOTE — Telephone Encounter (Signed)
LM to call back.

## 2020-06-25 DIAGNOSIS — D0361 Melanoma in situ of right upper limb, including shoulder: Secondary | ICD-10-CM | POA: Diagnosis not present

## 2020-06-25 DIAGNOSIS — D229 Melanocytic nevi, unspecified: Secondary | ICD-10-CM | POA: Diagnosis not present

## 2020-06-25 DIAGNOSIS — A630 Anogenital (venereal) warts: Secondary | ICD-10-CM | POA: Diagnosis not present

## 2020-06-25 DIAGNOSIS — C4371 Malignant melanoma of right lower limb, including hip: Secondary | ICD-10-CM | POA: Diagnosis not present

## 2020-06-25 DIAGNOSIS — L814 Other melanin hyperpigmentation: Secondary | ICD-10-CM | POA: Diagnosis not present

## 2020-06-25 DIAGNOSIS — L821 Other seborrheic keratosis: Secondary | ICD-10-CM | POA: Diagnosis not present

## 2020-07-02 ENCOUNTER — Other Ambulatory Visit: Payer: Self-pay

## 2020-07-02 ENCOUNTER — Encounter: Payer: Self-pay | Admitting: Psychiatry

## 2020-07-02 ENCOUNTER — Ambulatory Visit (INDEPENDENT_AMBULATORY_CARE_PROVIDER_SITE_OTHER): Payer: Medicare Other | Admitting: Psychiatry

## 2020-07-02 DIAGNOSIS — G3184 Mild cognitive impairment, so stated: Secondary | ICD-10-CM | POA: Diagnosis not present

## 2020-07-02 DIAGNOSIS — F4001 Agoraphobia with panic disorder: Secondary | ICD-10-CM | POA: Diagnosis not present

## 2020-07-02 DIAGNOSIS — F5105 Insomnia due to other mental disorder: Secondary | ICD-10-CM | POA: Diagnosis not present

## 2020-07-02 DIAGNOSIS — F40240 Claustrophobia: Secondary | ICD-10-CM

## 2020-07-02 DIAGNOSIS — F3130 Bipolar disorder, current episode depressed, mild or moderate severity, unspecified: Secondary | ICD-10-CM

## 2020-07-02 DIAGNOSIS — F411 Generalized anxiety disorder: Secondary | ICD-10-CM

## 2020-07-02 NOTE — Patient Instructions (Signed)
Restart oxcarbazepine 1/2 tablet daily for 1 week then 1 daily.

## 2020-07-02 NOTE — Progress Notes (Signed)
Bill Guerrero [id number] August 13, 1950 70 y.o.  Subjective:   Patient ID:  Bill Guerrero is a 70 y.o. (DOB 1950/11/06) male.  Chief Complaint:  Chief Complaint  Patient presents with  . Follow-up  . Bipolar I disorder, most recent episode depressed ([place])  . Anxiety  . Sleeping Problem    HPI Bill Guerrero presents to the office today for follow-up of mood disorder, anxiety, and chronic insomnia.  He is chronically stressed and down due to the combination of dealing with Parkinson's disease and history of cancer.  seen in march 2021.  No meds were changed. The following was noted: Started playing banjo and taking lessons.  Practices daily.   Stress many things he can no longer do physically.  Can't do housework like before.  Getting him down.  Pushing himself to walk with a walker.  Pain in walking without walker.   Has done PT and finished. Accidentally dropped dose of Trileptal for ? Time frame without a problem to 150 mg daily.  No mood swings noticed.   "Made it longer than they thought I would".  Still in remission with melanoma with follow up tomorrow.   But hard to get around.  Still has bad days emotionally up to a week at a time.  Anxiety is worse than depression.  Easily overwhelmed.   Pattern of EMA at 3 am and then falls asleep watching TV.  Become a habit. Then naps.   Occ forgets Seroquel.  09/18/19 appt with the following noted: Bad day with weakness and PD.  Fell yesterday down 3-4 steps. It's  Getting worse. Not getting better on the banjo.   Started [place].  Interest in music daily.   It's so much fun. Hallucinated on amantadine and fell and stopped. Little Ativan. No current treatment for cancer. Questions about timing of quetiapine and gets dizzy and weak Plan: Consider reducing quetiapine ER to minimize SE but risk increase mood problems. Currently seems to be doing ok with depression and mood swings.  Yes bc he gets up at night, REDUCE  quetiapine to ER 200 mg nightly.  01/18/20 appt with following noted: Asks for jury excuse due to Parkinson's Dz with cognitive impairment, alertness and poor memory. Tries to walk every other day.  Latley more back pain has gotten him really down.  Then can think what's the purpose of life.  His family is not supportive.  Wife and son not very helpful.  Had bad feelings about it.  Frustrated with family.  Down for a couple of weeks.  PD progressed last 6 mos and harder at times to move.   Was falling a lot 6 mos ago, but not in the last 6 mos. No difference with reduction in Seroquel and still feels real hung over in the day. Plan: Apparently no worse with reduction quetiapine to ER 200 mg nightly,  DT drowsiness reduce again to 150 mg ER HS  05/01/2020 appointment with the following noted:  More anxiety, but no worsening manic sx.  Sleep MN to 3 AM and then after a few days sleep more.  Same pattern as 6 mos or longer.  No one else notices any changes with reduction.  He notices no changes with PD.  The off times with PD have been more abrupt.  Takes a long time to do things.  Family just thinks he's slow. No changes with mood. 10 years ago slept 8 hour night.  Not that sleepy during the day lately.  Rare lorazepam. Plan: Reduce oxcarbazepine to 1/2 tablet at night for 2 weeks and if sleep is unchanged then stop it. The goal is to minimize meds that might interfere with Parkinson's medications or increased risk of falling.  06/18/2020 phone call from patient's wife stating mist he was having more anxiety and panic attacks at night and wondering if it was related to med changes. MD response was that it was possible that oxcarbazepine discontinuance could lead to more anxiety or even mania.  He should let us know if he was having any manic symptoms.  Reviewed the purpose of the med change was to minimize drug interactions with Parkinson's disease medications and therefore rather than restarting the  oxcarbazepine we would add lorazepam 0.5 to 1 mg nightly or every 8 hours as needed anxiety.  And to let us know if that failed.  07/02/2020 appointment with the following noted: About every night 6-7 PM get "dreads".  Dreading wife going to bed and him being alone.  To the point of crying and begging wife to stay up with him.  For the last week using it regularly in the afternoon about 2 mg lorazepam.   New problems since stopping the oxcarbazepine.   PD sx are not better off the Trileptal.  Overall sx seems to get a little worse.  Satisfied with meds.  No other new concerns.  Rarely uses Ativan except when goes to doctors' offices or with CT scans Dt claustrophobia. Pt reports that mood is Anxious and describes anxiety as Moderate. Anxiety symptoms include: Excessive Worry,. Pt reports has interrupted sleep, not much change off Trileptal. Pt reports that appetite is good. Pt reports that energy is lethargic and no change. Concentration is down slightly. Suicidal thoughts:  denied by patient.  Less dep and anxiety than last visit. Melanoma responded really well to immunotherapy cancer treatment.    Discussed concerns about the cost of Seroquel Exar and how bad he feels if he runs out of the medication.  PD followed by Dr. Carles Collet dx about 2016 Vaccinated.  Past Psychiatric Medication Trials: Seroquel XR 300  Review of Systems:  Review of Systems  Constitutional: Positive for fatigue.  Cardiovascular: Negative for chest pain and palpitations.  Genitourinary:       Urinary incontinence  Musculoskeletal: Positive for arthralgias, back pain and gait problem.  Neurological: Positive for dizziness, tremors, syncope and weakness.       Balance problems from PD.  Psychiatric/Behavioral: Positive for sleep disturbance. Negative for agitation, behavioral problems, confusion, decreased concentration, dysphoric mood, hallucinations, self-injury and suicidal ideas. The patient is nervous/anxious. The  patient is not hyperactive.   Frequent falls are a problem.  Can't pick himself up without help.  Medications: I have reviewed the patient's current medications.  Current Outpatient Medications  Medication Sig Dispense Refill  . AMBULATORY NON FORMULARY MEDICATION Upright walker Dx: [name] 1 Device 0  . Ascorbic Acid (VITAMIN C) 1000 MG tablet Take 1,000 mg by mouth 2 (two) times daily.    . B Complex-C (B-COMPLEX WITH VITAMIN C) tablet Take 1 tablet by mouth daily.    . carbidopa-levodopa (SINEMET CR) 50-200 MG tablet Take 1 tablet by mouth at bedtime. 90 tablet 1  . carbidopa-levodopa (SINEMET IR) 25-100 MG tablet TAKE TWO TABELTS BY MOUTH AT 8AM, NOON, 4PM, AND AT 8PM AS DIRECTED 240 tablet 4  . Cholecalciferol (VITAMIN D-3) 5000 UNITS TABS Take 1 tablet by mouth daily.    . entacapone (COMTAN) 200 MG tablet Take  1 tablet (200 mg total) by mouth 3 (three) times daily. Take with 8am/noon/4pm dosage of levodopa 270 tablet 1  . lamoTRIgine (LAMICTAL) 200 MG tablet Take 1 tablet (200 mg total) by mouth at bedtime. 90 tablet 1  . LORazepam (ATIVAN) 2 MG tablet TAKE 1/2 TO 1 TABLET BY MOUTH twice DAILY AS NEEDED FOR PANIC ATTACKS 30 tablet 0  . losartan (COZAAR) 50 MG tablet Take 50 mg by mouth daily. Pt. Is unsure of Mg.    . magnesium oxide (MAG-OX) 400 MG tablet Take 400 mg by mouth daily.    . Omega-3 Fatty Acids (FISH OIL BURP-LESS PO) Take 1 capsule by mouth in the morning and at bedtime.    . Potassium 99 MG TABS Take 1 tablet by mouth daily.    . pramipexole (MIRAPEX) 0.5 MG tablet Take 1 tablet (0.5 mg total) by mouth 3 (three) times daily. 270 tablet 1  . pravastatin (PRAVACHOL) 80 MG tablet Take 80 mg by mouth daily before breakfast.    . QUEtiapine Fumarate (SEROQUEL XR) 150 MG 24 hr tablet TAKE ONE TABLET BY MOUTH EVERY NIGHT AT BEDTIME 30 tablet 0  . niacin 500 MG tablet Take 500 mg by mouth daily with breakfast. (Patient not taking: No sig reported)    . OXcarbazepine (TRILEPTAL)  150 MG tablet Take 1 tablet (150 mg total) by mouth 2 (two) times daily. (Patient not taking: Reported on 07/02/2020) 180 tablet 1   No current facility-administered medications for this visit.    Medication Side Effects: None  Allergies: No Known Allergies  Past Medical History:  Diagnosis Date  . Arthritis    hips replacement  . Bipolar 1 disorder ([place])   . Hyperlipemia   . Hypertension   . 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 Multiple body piercings   . Tremor    RT HAND    Family History  Problem Relation Age of Onset  . Leukemia Mother   . Healthy Sister   . Healthy Sister   . Healthy Son   . Healthy Son   . Healthy Daughter     Social History   Socioeconomic History  . Marital status: Married    Spouse name: Not on file  . Number of children: 3  . Years of education: Not on file  . Highest education level: 12th grade  Occupational History  . Not on file  Tobacco Use  . Smoking status: Never Smoker  . Smokeless tobacco: Never Used  Vaping Use  . Vaping Use: Never used  Substance and Sexual Activity  . Alcohol use: No    Alcohol/week: 1.0 standard drink    Types: 1 Glasses of wine per week    Comment: no alcohol in 2 years  . Drug use: No  . Sexual activity: Yes  Other Topics Concern  . Not on file  Social History Narrative   Right handed   2 Story home    Lives with spouse and son   Social Determinants of Health   Financial Resource Strain: Not on file  Food Insecurity: Not on file  Transportation Needs: Not on file  Physical Activity: Not on file  Stress: Not on file  Social Connections: Not on file  Intimate Partner Violence: Not on file    Past Medical History, Surgical history, Social history, and Family history were reviewed and updated as appropriate.   Please see review  of systems for further details on the patient's review from today.   Objective:    Physical Exam:  There were no vitals taken for this visit.  Physical Exam Constitutional:      General: He is not in acute distress.    Appearance: Normal appearance. He is well-developed. He is obese.  Musculoskeletal:        General: No deformity.  Neurological:     Mental Status: He is alert and oriented to person, place, and time.     Motor: Tremor present.     Coordination: Coordination abnormal.     Gait: Gait abnormal.     Comments: Uses walker for balance  Psychiatric:        Attention and Perception: Attention and perception normal.        Mood and Affect: Mood is anxious and depressed. Affect is not labile, blunt, angry or inappropriate.        Speech: Speech normal. Speech is not slurred.        Behavior: Behavior is slowed.        Thought Content: Thought content normal. Thought content is not paranoid. Thought content does not include homicidal or suicidal ideation. Thought content does not include homicidal or suicidal plan.        Cognition and Memory: Cognition normal.        Judgment: Judgment normal.     Comments: Insight intact. No auditory or visual hallucinations. No delusions.  Depression goes up and down with health and situation Anxiety is worse in PM     Lab Review:     Component Value Date/Time   NA 141 03/30/2016 1219   K 3.7 03/30/2016 1219   CL 107 03/30/2016 1219   CO2 25 03/30/2016 1219   GLUCOSE 97 03/30/2016 1219   BUN 17 03/30/2016 1219   CREATININE 0.80 03/30/2016 1219   CALCIUM 9.4 03/30/2016 1219   PROT 6.9 03/30/2016 1219   ALBUMIN 4.7 03/30/2016 1219   AST 21 03/30/2016 1219   ALT 27 03/30/2016 1219   ALKPHOS 65 03/30/2016 1219   BILITOT 0.6 03/30/2016 1219   GFRNONAA 86 (L) 10/06/2012 1130   GFRAA >90 10/06/2012 1130       Component Value Date/Time   WBC 5.6 11/26/2014 1133   RBC 4.04 (L) 11/26/2014 1133   HGB 12.8 (L) 11/26/2014 1133   HCT 38.8 (L) 11/26/2014 1133   PLT 195.0 11/26/2014 1133   MCV 96.2 11/26/2014  1133   MCH 31.9 10/06/2012 1130   MCHC 33.0 11/26/2014 1133   RDW 13.6 11/26/2014 1133   LYMPHSABS 1.1 11/26/2014 1133   MONOABS 0.6 11/26/2014 1133   EOSABS 0.1 11/26/2014 1133   BASOSABS 0.0 11/26/2014 1133    Lithium Lvl  Date Value Ref Range Status  08/17/2010 0.44 (L) 0.80 - 1.40 mEq/L Final     No results found for: PHENYTOIN, PHENOBARB, VALPROATE, CBMZ   .res Assessment: Plan:    Bipolar I disorder, most recent episode depressed ([place])  Panic disorder with agoraphobia  Generalized anxiety disorder  Claustrophobia  Insomnia due to mental condition  Mild cognitive impairment   Disc balancing benefit vs SE of Seroquel in view of Parkinson's Dz.   Discussed potential metabolic side effects associated with atypical antipsychotics, as well as potential risk for movement side effects. Advised pt to contact office if movement side effects occur.   Currently seems to be doing ok with depression and mood swings.   Apparently no worse with reduction  quetiapine to 150 mg ER HS . Continue it. Less drowsiness now  Call if sleep worsens or mania. Restart oxcarbazepine to 1/2 tablet at night for 1 weeks and then 1 each evening..To stop the sense of dread and anxiety in the evening.  Disc pain management and answered questions of opiate use.  May need a pain clinic. Disc fall risk with pain meds.  Supportive therapy and health direction given severe dx PD and malignant melanoma.  And dealing with family's lack of attention.  Enc cont exercise which is helping.  Disc risk of PD meds affecting psych status including  Risk of PD.  Chronic insomnia is stable.  We discussed the short-term risks associated with benzodiazepines including sedation and increased fall risk among others.  Discussed long-term side effect risk including dependence, potential withdrawal symptoms, and the potential eventual dose-related risk of dementia.  He is not taking much of this.  Disc costs of Rx and  how to manage for ex with GoodRx he forgot to look into this and he was reminded about it today.  He was given written information about it.   Patient agrees with this plan.  2-3 mos  Lynder Parents, MD, DFAPA     Please see After Visit Summary for patient specific instructions.  Future Appointments  Date Time Provider [facility]  10/31/2020  1:00 PM [name], [name], DO [id number] None    No orders of the defined types were placed in this encounter.     -------------------------------

## 2020-07-11 ENCOUNTER — Other Ambulatory Visit: Payer: Self-pay | Admitting: Psychiatry

## 2020-07-11 DIAGNOSIS — F4001 Agoraphobia with panic disorder: Secondary | ICD-10-CM

## 2020-07-11 NOTE — Telephone Encounter (Signed)
Last filled 06/21/20 appt on 09/23/20

## 2020-07-25 ENCOUNTER — Telehealth: Payer: Self-pay

## 2020-07-25 NOTE — Telephone Encounter (Signed)
How is he taking the lorazepam?  How much lorazepam total for the day?

## 2020-07-26 NOTE — Telephone Encounter (Signed)
LVM for pt to return call

## 2020-07-29 ENCOUNTER — Other Ambulatory Visit: Payer: Self-pay | Admitting: Psychiatry

## 2020-07-29 DIAGNOSIS — F3130 Bipolar disorder, current episode depressed, mild or moderate severity, unspecified: Secondary | ICD-10-CM

## 2020-07-29 NOTE — Telephone Encounter (Signed)
reviewed

## 2020-07-29 NOTE — Telephone Encounter (Signed)
FYI I have been trying to call him and left messages,he is not answering my calls so that we can help him.Ill keep trying

## 2020-07-29 NOTE — Telephone Encounter (Signed)
Pt left a message stating that he is waiting to hear back about his panic attacks. He said they are getting worse. They happen late afternoon and into the evening. Not able to get sleep. Please call pt back at [phone]

## 2020-08-01 ENCOUNTER — Telehealth: Payer: Self-pay | Admitting: Psychiatry

## 2020-08-01 ENCOUNTER — Telehealth: Payer: Self-pay | Admitting: Physician Assistant

## 2020-08-01 NOTE — Telephone Encounter (Signed)
Bill Guerrero said they can be here at 11:30am no problem.

## 2020-08-01 NOTE — Telephone Encounter (Signed)
Coming in tomorrow.  

## 2020-08-01 NOTE — Telephone Encounter (Signed)
Reviewed

## 2020-08-01 NOTE — Telephone Encounter (Signed)
He can certainly go to the hospital anytime he feels it is medically necessary.  They are unlikely to admit him to the psychiatric ward unless he has suicidal thoughts or is having psychotic symptoms like paranoia.  Or seeing things.  If he feels he can wait I can see him for an appointment tomorrow, Friday, July 1 at 11:30 AM.  I do not think there is any further adjustments I can do over the phone because they clearly have not worked.

## 2020-08-01 NOTE — Telephone Encounter (Signed)
Spoke to Bill Guerrero and informed her we have been trying to reach them for a week and they are aware,steve was not picking up his phone.She stated he takes 1-2 ativan in the evening time.

## 2020-08-01 NOTE — Telephone Encounter (Signed)
Bill Guerrero called back and said Elo is already taking 1.5 tabs daily and he feels like it has made him worse.He wants to go to the hospital unless there is anything else that can be done to help

## 2020-08-01 NOTE — Telephone Encounter (Signed)
Have him increase oxcarbazepine to 1/2 tablet in the morning and 1 tablet at night.  His symptoms have seemed to get worse since he was taken off of that medication.  Confirm that he is currently taking 1 oxcarbazepine at night.

## 2020-08-01 NOTE — Telephone Encounter (Signed)
Received a call from answering service last night, 07/31/2020, at 10:30 PM.  Patient's sister, Vivien Rota, had called stating patient was "having an episode."  I called the number given, [phone], and went straight to voicemail.  I left a message that I was calling and would call again shortly.  Tried again at 10:33 PM and straight to voicemail again I left a message.  I called the patient again at 10:40 PM, went straight to voicemail left another message.  I then called answering service asking them to connect they also got voicemail.  I called the fourth time at 10:49 PM got voicemail, left message telling him to go to the emergency room if needed, otherwise call our office this morning since I was unable to reach.

## 2020-08-01 NOTE — Telephone Encounter (Signed)
Bill Guerrero stated she believes he is taking 1 at night and will have him increase.She will call back if he needs a Rx

## 2020-08-01 NOTE — Telephone Encounter (Signed)
Noted this has been a problem.  Patient calls asking for phone call back and it goes straight to voicemail or else voicemail is full but in any case we have tried to reach him repeatedly without success.

## 2020-08-01 NOTE — Telephone Encounter (Signed)
Next visit is 09/23/20. Steve's wife Charlesetta Ivory called and said that Richardson Landry is having a really hard time. He is not sleeping, having high anxiety, crying and afraid to be alone. Charlesetta Ivory said he felt like he was maybe having a nervous breakdown. Charlesetta Ivory is listed on the DPR to speak to. Her phone number is [phone].

## 2020-08-02 ENCOUNTER — Encounter: Payer: Self-pay | Admitting: Psychiatry

## 2020-08-02 ENCOUNTER — Other Ambulatory Visit: Payer: Self-pay

## 2020-08-02 ENCOUNTER — Ambulatory Visit (INDEPENDENT_AMBULATORY_CARE_PROVIDER_SITE_OTHER): Payer: Medicare Other | Admitting: Psychiatry

## 2020-08-02 DIAGNOSIS — F411 Generalized anxiety disorder: Secondary | ICD-10-CM

## 2020-08-02 DIAGNOSIS — F4001 Agoraphobia with panic disorder: Secondary | ICD-10-CM

## 2020-08-02 DIAGNOSIS — G3184 Mild cognitive impairment, so stated: Secondary | ICD-10-CM | POA: Diagnosis not present

## 2020-08-02 DIAGNOSIS — F3130 Bipolar disorder, current episode depressed, mild or moderate severity, unspecified: Secondary | ICD-10-CM | POA: Diagnosis not present

## 2020-08-02 DIAGNOSIS — F40240 Claustrophobia: Secondary | ICD-10-CM

## 2020-08-02 DIAGNOSIS — F5105 Insomnia due to other mental disorder: Secondary | ICD-10-CM | POA: Diagnosis not present

## 2020-08-02 MED ORDER — OXCARBAZEPINE 150 MG PO TABS: 150.0000 mg | ORAL_TABLET | Freq: Two times a day (BID) | ORAL | 1 refills | Status: DC

## 2020-08-02 MED ORDER — QUETIAPINE FUMARATE ER 200 MG PO TB24: 200.0000 mg | ORAL_TABLET | Freq: Every day | ORAL | 1 refills | Status: DC

## 2020-08-02 NOTE — Patient Instructions (Signed)
Increase Trileptal also called oxcarbazepine to 1 tablet in the morning and 1 tablet at night Increase quetiapine ER or Seroquel ER to 200 mg at night which is the previous dosage.

## 2020-08-02 NOTE — Progress Notes (Signed)
Myrick Mcnairy Dudzik [id number] 1950/02/17 70 y.o.  Subjective:   Patient ID:  Bill Guerrero is a 70 y.o. (DOB 01-13-1951) male.  Chief Complaint:  Chief Complaint  Patient presents with   Follow-up   Bipolar I disorder, most recent episode depressed ([place])   Anxiety   Panic Attack    HPI Dominion Kathan Wilinski presents to the office today for follow-up of mood disorder, anxiety, and chronic insomnia.  He is chronically stressed and down due to the combination of dealing with Parkinson's disease and history of cancer.  seen in march 2021.  No meds were changed. The following was noted: Started playing banjo and taking lessons.  Practices daily.   Stress many things he can no longer do physically.  Can't do housework like before.  Getting him down.  Pushing himself to walk with a walker.  Pain in walking without walker.   Has done PT and finished. Accidentally dropped dose of Trileptal for ? Time frame without a problem to 150 mg daily.  No mood swings noticed.   "Made it longer than they thought I would".  Still in remission with melanoma with follow up tomorrow.   But hard to get around.  Still has bad days emotionally up to a week at a time.  Anxiety is worse than depression.  Easily overwhelmed.   Pattern of EMA at 3 am and then falls asleep watching TV.  Become a habit. Then naps.   Occ forgets Seroquel.  09/18/19 appt with the following noted: Bad day with weakness and PD.  Fell yesterday down 3-4 steps. It's  Getting worse. Not getting better on the banjo.   Started [place].  Interest in music daily.   It's so much fun. Hallucinated on amantadine and fell and stopped. Little Ativan. No current treatment for cancer. Questions about timing of quetiapine and gets dizzy and weak Plan: Consider reducing quetiapine ER to minimize SE but risk increase mood problems. Currently seems to be doing ok with depression and mood swings.  Yes bc he gets up at night, REDUCE quetiapine to  ER 200 mg nightly.  01/18/20 appt with following noted: Asks for jury excuse due to Parkinson's Dz with cognitive impairment, alertness and poor memory. Tries to walk every other day.  Latley more back pain has gotten him really down.  Then can think what's the purpose of life.  His family is not supportive.  Wife and son not very helpful.  Had bad feelings about it.  Frustrated with family.  Down for a couple of weeks.  PD progressed last 6 mos and harder at times to move.   Was falling a lot 6 mos ago, but not in the last 6 mos. No difference with reduction in Seroquel and still feels real hung over in the day. Plan: Apparently no worse with reduction quetiapine to ER 200 mg nightly,  DT drowsiness reduce again to 150 mg ER HS  05/01/2020 appointment with the following noted:  More anxiety, but no worsening manic sx.  Sleep MN to 3 AM and then after a few days sleep more.  Same pattern as 6 mos or longer.  No one else notices any changes with reduction.  He notices no changes with PD.  The off times with PD have been more abrupt.  Takes a long time to do things.  Family just thinks he's slow. No changes with mood. 10 years ago slept 8 hour night.  Not that sleepy during the day lately.  Rare lorazepam. Plan: Reduce oxcarbazepine to 1/2 tablet at night for 2 weeks and if sleep is unchanged then stop it. The goal is to minimize meds that might interfere with Parkinson's medications or increased risk of falling.  06/18/2020 phone call from patient's wife stating mist he was having more anxiety and panic attacks at night and wondering if it was related to med changes. MD response was that it was possible that oxcarbazepine discontinuance could lead to more anxiety or even mania.  He should let us know if he was having any manic symptoms.  Reviewed the purpose of the med change was to minimize drug interactions with Parkinson's disease medications and therefore rather than restarting the oxcarbazepine we  would add lorazepam 0.5 to 1 mg nightly or every 8 hours as needed anxiety.  And to let us know if that failed.  07/02/2020 appointment with the following noted: About every night 6-7 PM get "dreads".  Dreading wife going to bed and him being alone.  To the point of crying and begging wife to stay up with him.  For the last week using it regularly in the afternoon about 2 mg lorazepam.   New problems since stopping the oxcarbazepine.   PD sx are not better off the Trileptal.  Overall sx seems to get a little worse. Satisfied with meds.  No other new concerns.  Rarely uses Ativan except when goes to doctors' offices or with CT scans Dt claustrophobia. Pt reports that mood is Anxious and describes anxiety as Moderate. Anxiety symptoms include: Excessive Worry,. Pt reports has interrupted sleep, not much change off Trileptal. Pt reports that appetite is good. Pt reports that energy is lethargic and no change. Concentration is down slightly. Suicidal thoughts:  denied by patient.  Less dep and anxiety than last visit. Melanoma responded really well to immunotherapy cancer treatment.   Discussed concerns about the cost of Seroquel Exar and how bad he feels if he runs out of the medication. Plan: Apparently no worse with reduction quetiapine to 150 mg ER HS . Continue it. Less drowsiness now  Call if sleep worsens or mania. Restart oxcarbazepine to 1/2 tablet at night for 1 weeks and then 1 each evening..To stop the sense of dread and anxiety in the evening.  08/02/2020 urgent appointment with the following noted: Several recent phone calls complaining of increased anxiety unresolved by attempts to adjust medication over the phone. Worst panic attacks I've ever had, now daily.  Start early afternoon over worry he'll be alone at night bc wife in bed and he's awake.  Crying.  Can get a claustrophobic feeling and fear of not being able to move and see his feet. Near blackout spells. Out of nowhere.   Only  lasts seconds.   Unrealistic fear that he'll die despite positive exams that don't support the fear.  PD followed by Dr. Carles Collet dx about 2016 Vaccinated.  Past Psychiatric Medication Trials: Seroquel XR 300  Review of Systems:  Review of Systems  Constitutional:  Positive for fatigue.  Respiratory:  Negative for wheezing.   Cardiovascular:  Negative for chest pain and palpitations.  Genitourinary:        Urinary incontinence  Musculoskeletal:  Positive for arthralgias, back pain and gait problem.  Neurological:  Positive for dizziness, tremors, syncope and weakness.       Balance problems from PD.  Psychiatric/Behavioral:  Positive for sleep disturbance. Negative for agitation, behavioral problems, confusion, decreased concentration, dysphoric mood, hallucinations, self-injury and suicidal ideas.  The patient is nervous/anxious. The patient is not hyperactive.  Frequent falls are a problem.  Can't pick himself up without help.  Medications: I have reviewed the patient's current medications.  Current Outpatient Medications  Medication Sig Dispense Refill   AMBULATORY NON FORMULARY MEDICATION Upright walker Dx: [name] 1 Device 0   Ascorbic Acid (VITAMIN C) 1000 MG tablet Take 1,000 mg by mouth 2 (two) times daily.     B Complex-C (B-COMPLEX WITH VITAMIN C) tablet Take 1 tablet by mouth daily.     carbidopa-levodopa (SINEMET CR) 50-200 MG tablet Take 1 tablet by mouth at bedtime. 90 tablet 1   carbidopa-levodopa (SINEMET IR) 25-100 MG tablet TAKE TWO TABELTS BY MOUTH AT 8AM, NOON, 4PM, AND AT 8PM AS DIRECTED 240 tablet 4   Cholecalciferol (VITAMIN D-3) 5000 UNITS TABS Take 1 tablet by mouth daily.     entacapone (COMTAN) 200 MG tablet Take 1 tablet (200 mg total) by mouth 3 (three) times daily. Take with 8am/noon/4pm dosage of levodopa 270 tablet 1   lamoTRIgine (LAMICTAL) 200 MG tablet Take 1 tablet (200 mg total) by mouth at bedtime. 90 tablet 1   LORazepam (ATIVAN) 2 MG tablet TAKE 1/2 TO 1  TABLET BY MOUTH TWICE DAILY AS NEEDED FOR PANIC ATTACKS 30 tablet 2   losartan (COZAAR) 50 MG tablet Take 50 mg by mouth daily. Pt. Is unsure of Mg.     magnesium oxide (MAG-OX) 400 MG tablet Take 400 mg by mouth daily.     niacin 500 MG tablet Take 500 mg by mouth daily with breakfast.     Omega-3 Fatty Acids (FISH OIL BURP-LESS PO) Take 1 capsule by mouth in the morning and at bedtime.     Potassium 99 MG TABS Take 1 tablet by mouth daily.     pramipexole (MIRAPEX) 0.5 MG tablet Take 1 tablet (0.5 mg total) by mouth 3 (three) times daily. 270 tablet 1   pravastatin (PRAVACHOL) 80 MG tablet Take 80 mg by mouth daily before breakfast.     OXcarbazepine (TRILEPTAL) 150 MG tablet Take 1 tablet (150 mg total) by mouth 2 (two) times daily. 180 tablet 1   QUEtiapine Fumarate (SEROQUEL XR) 200 MG 24 hr tablet Take 1 tablet (200 mg total) by mouth at bedtime. 30 tablet 1   No current facility-administered medications for this visit.    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 -"pinkish tan""no odor"   Multiple body piercings    Tremor    RT HAND    Family History  Problem Relation Age of Onset   Leukemia Mother    Healthy Sister    Healthy Sister    Healthy Son    Healthy Son    Healthy Daughter     Social History   Socioeconomic History   Marital status: Married    Spouse name: Not on file   Number of children: 3   Years of education: Not on file   Highest education level: 12th grade  Occupational History   Not on file  Tobacco Use   Smoking status: Never   Smokeless tobacco: Never  Vaping Use   Vaping Use: Never used  Substance and Sexual Activity  Alcohol use: No    Alcohol/week: 1.0 standard drink    Types: 1 Glasses of wine  per week    Comment: no alcohol in 2 years   Drug use: No   Sexual activity: Yes  Other Topics Concern   Not on file  Social History Narrative   Right handed   2 Story home    Lives with spouse and son   Social Determinants of Health   Financial Resource Strain: Not on file  Food Insecurity: Not on file  Transportation Needs: Not on file  Physical Activity: Not on file  Stress: Not on file  Social Connections: Not on file  Intimate Partner Violence: Not on file    Past Medical History, Surgical history, Social history, and Family history were reviewed and updated as appropriate.   Please see review of systems for further details on the patient's review from today.   Objective:   Physical Exam:  There were no vitals taken for this visit.  Physical Exam Constitutional:      General: He is not in acute distress.    Appearance: Normal appearance. He is obese.  Musculoskeletal:        General: No deformity.  Neurological:     Mental Status: He is alert and oriented to person, place, and time.     Coordination: Coordination abnormal.     Gait: Gait abnormal.     Comments: Uses walker for balance  Psychiatric:        Attention and Perception: Attention and perception normal.        Mood and Affect: Mood is anxious and depressed. Affect is not labile, blunt, angry, tearful or inappropriate.        Speech: Speech normal. Speech is not slurred.        Behavior: Behavior is slowed.        Thought Content: Thought content normal. Thought content is not paranoid. Thought content does not include homicidal or suicidal ideation. Thought content does not include homicidal or suicidal plan.        Cognition and Memory: Cognition normal.        Judgment: Judgment normal.     Comments: Insight intact. No auditory or visual hallucinations. No delusions.  Depression goes up and down with health and situation Anxiety is worse in PM    Lab Review:     Component Value Date/Time    NA 141 03/30/2016 1219   K 3.7 03/30/2016 1219   CL 107 03/30/2016 1219   CO2 25 03/30/2016 1219   GLUCOSE 97 03/30/2016 1219   BUN 17 03/30/2016 1219   CREATININE 0.80 03/30/2016 1219   CALCIUM 9.4 03/30/2016 1219   PROT 6.9 03/30/2016 1219   ALBUMIN 4.7 03/30/2016 1219   AST 21 03/30/2016 1219   ALT 27 03/30/2016 1219   ALKPHOS 65 03/30/2016 1219   BILITOT 0.6 03/30/2016 1219   GFRNONAA 86 (L) 10/06/2012 1130   GFRAA >90 10/06/2012 1130       Component Value Date/Time   WBC 5.6 11/26/2014 1133   RBC 4.04 (L) 11/26/2014 1133   HGB 12.8 (L) 11/26/2014 1133   HCT 38.8 (L) 11/26/2014 1133   PLT 195.0 11/26/2014 1133   MCV 96.2 11/26/2014 1133   MCH 31.9 10/06/2012 1130   MCHC 33.0 11/26/2014 1133   RDW 13.6 11/26/2014 1133   LYMPHSABS 1.1 11/26/2014 1133   MONOABS 0.6 11/26/2014 1133   EOSABS 0.1 11/26/2014 1133   BASOSABS 0.0 11/26/2014 1133  Lithium Lvl  Date Value Ref Range Status  08/17/2010 0.44 (L) 0.80 - 1.40 mEq/L Final     No results found for: PHENYTOIN, PHENOBARB, VALPROATE, CBMZ   .res Assessment: Plan:    Panic disorder with agoraphobia  Bipolar I disorder, most recent episode depressed ([place]) - Plan: OXcarbazepine (TRILEPTAL) 150 MG tablet, QUEtiapine Fumarate (SEROQUEL XR) 200 MG 24 hr tablet  Generalized anxiety disorder  Claustrophobia  Insomnia due to mental condition  Mild cognitive impairment   Disc balancing benefit vs SE of Seroquel in view of Parkinson's Dz.   Discussed potential metabolic side effects associated with atypical antipsychotics, as well as potential risk for movement side effects. Advised pt to contact office if movement side effects occur.   Currently seems to be doing ok with depression and mood swings.   Howerver anxiety is much worse and not clear which thing may be causing it.  Apparently worse with reduction quetiapine to 150 mg ER HS bc severe anxiety with fear he'll die.  Nothing better with less medication and  in fact worse. Increase quetiapine back to ER 200 mg HS. Less drowsiness now  Call if sleep worsens or mania. Increase oxcarbazepine to 1 tablet in AM and  each evening..To stop the sense of dread and anxiety in the evening.  Disc pain management and answered questions of opiate use.  May need a pain clinic. Disc fall risk with pain meds.  Supportive therapy and health direction given severe dx PD and malignant melanoma.  And dealing with family's lack of attention.  Enc cont exercise which is helping.  Disc risk of PD meds affecting psych status including  Risk of PD.  Chronic insomnia is stable.  We discussed the short-term risks associated with benzodiazepines including sedation and increased fall risk among others.  Discussed long-term side effect risk including dependence, potential withdrawal symptoms, and the potential eventual dose-related risk of dementia.  He is taking more of this.  Disc costs of Rx and how to manage for ex with [employer] he forgot to look into this and he was reminded about it today.  He was given written information about it.   Patient agrees with this plan.  2 mos  Lynder Parents, MD, DFAPA     Please see After Visit Summary for patient specific instructions.  Future Appointments  Date Time Provider [place]  09/23/2020 11:00 AM [name], [name], MD [facility] None  10/31/2020  1:00 PM [name], [name], DO [id number] None    No orders of the defined types were placed in this encounter.     -------------------------------

## 2020-08-08 ENCOUNTER — Other Ambulatory Visit: Payer: Self-pay | Admitting: Psychiatry

## 2020-08-09 ENCOUNTER — Telehealth: Payer: Self-pay | Admitting: Neurology

## 2020-08-09 ENCOUNTER — Telehealth: Payer: Self-pay | Admitting: Psychiatry

## 2020-08-09 NOTE — Telephone Encounter (Signed)
RTC  Couple good days and now anxiety and panic hit and shaking and afraid to be alone tonight.  Scared of big panic attacks and doesn't want to be alone.  1 week ago increased quetiapine ER 200 HS and oxcarb to 150 mg 1 BID.  Tired and not enough sleep DT panic.  Afraid of being alone. Takes lorazepam prn 2 mg .  Most days taking 2 daily.  Doesn't make him sleepy.  Took it at 4 pm today.    Increase oxcarbazepine to 1 in AM  and 2 at 2 PM and then take 2 mg lorazepam about an hour before anxiety worsens about 3 PM.  Disc Cog techniques.  Lynder Parents, MD, DFAPA

## 2020-08-09 NOTE — Telephone Encounter (Signed)
Pt called and lm with AN. States he is having difficulty walking. He takes carbidopa/levo and this am he experienced shaking, nauseous and not feeling well.

## 2020-08-12 NOTE — Telephone Encounter (Signed)
Called patient and left a message for a call back.  

## 2020-08-13 NOTE — Telephone Encounter (Signed)
Called patient and line was picked up and hung up on. Unable to leave message.

## 2020-08-14 NOTE — Telephone Encounter (Signed)
Called and left a message for patient to return call.

## 2020-08-22 ENCOUNTER — Encounter: Payer: Self-pay | Admitting: Neurology

## 2020-08-27 DIAGNOSIS — F319 Bipolar disorder, unspecified: Secondary | ICD-10-CM | POA: Diagnosis not present

## 2020-08-27 DIAGNOSIS — C4371 Malignant melanoma of right lower limb, including hip: Secondary | ICD-10-CM | POA: Diagnosis not present

## 2020-08-27 DIAGNOSIS — Z8781 Personal history of (healed) traumatic fracture: Secondary | ICD-10-CM | POA: Diagnosis not present

## 2020-08-27 DIAGNOSIS — G20 Parkinson's disease: Secondary | ICD-10-CM | POA: Diagnosis not present

## 2020-09-09 DIAGNOSIS — J029 Acute pharyngitis, unspecified: Secondary | ICD-10-CM | POA: Diagnosis not present

## 2020-09-09 DIAGNOSIS — H6123 Impacted cerumen, bilateral: Secondary | ICD-10-CM | POA: Diagnosis not present

## 2020-09-23 ENCOUNTER — Other Ambulatory Visit: Payer: Self-pay

## 2020-09-23 ENCOUNTER — Ambulatory Visit (INDEPENDENT_AMBULATORY_CARE_PROVIDER_SITE_OTHER): Payer: Medicare Other | Admitting: Psychiatry

## 2020-09-23 ENCOUNTER — Encounter: Payer: Self-pay | Admitting: Psychiatry

## 2020-09-23 DIAGNOSIS — F40240 Claustrophobia: Secondary | ICD-10-CM | POA: Diagnosis not present

## 2020-09-23 DIAGNOSIS — F5105 Insomnia due to other mental disorder: Secondary | ICD-10-CM | POA: Diagnosis not present

## 2020-09-23 DIAGNOSIS — G3184 Mild cognitive impairment, so stated: Secondary | ICD-10-CM

## 2020-09-23 DIAGNOSIS — F4001 Agoraphobia with panic disorder: Secondary | ICD-10-CM | POA: Diagnosis not present

## 2020-09-23 DIAGNOSIS — F3130 Bipolar disorder, current episode depressed, mild or moderate severity, unspecified: Secondary | ICD-10-CM | POA: Diagnosis not present

## 2020-09-23 DIAGNOSIS — F411 Generalized anxiety disorder: Secondary | ICD-10-CM

## 2020-09-23 MED ORDER — QUETIAPINE FUMARATE 200 MG PO TABS: 200.0000 mg | ORAL_TABLET | Freq: Every day | ORAL | 1 refills | Status: DC

## 2020-09-23 MED ORDER — LORAZEPAM 2 MG PO TABS: ORAL_TABLET | ORAL | 2 refills | Status: DC

## 2020-09-23 NOTE — Patient Instructions (Addendum)
Take lorazepam 2 mg every evening at 6 PM

## 2020-09-23 NOTE — Progress Notes (Signed)
Bill Guerrero [id number] 08/01/1950 70 y.o.  Subjective:   Patient ID:  Bill Guerrero is a 70 y.o. (DOB [birth year]) male.  Chief Complaint:  Chief Complaint  Patient presents with   Follow-up   Depression   Anxiety   Sleeping Problem    HPI Bill Guerrero presents to the office today for follow-up of mood disorder, anxiety, and chronic insomnia.  He is chronically stressed and down due to the combination of dealing with Parkinson's disease and history of cancer.  seen in march 2021.  No meds were changed. The following was noted: Started playing banjo and taking lessons.  Practices daily.   Stress many things he can no longer do physically.  Can't do housework like before.  Getting him down.  Pushing himself to walk with a walker.  Pain in walking without walker.   Has done PT and finished. Accidentally dropped dose of Trileptal for ? Time frame without a problem to 150 mg daily.  No mood swings noticed.   "Made it longer than they thought I would".  Still in remission with melanoma with follow up tomorrow.   But hard to get around.  Still has bad days emotionally up to a week at a time.  Anxiety is worse than depression.  Easily overwhelmed.   Pattern of EMA at 3 am and then falls asleep watching TV.  Become a habit. Then naps.   Occ forgets Seroquel.  09/18/19 appt with the following noted: Bad day with weakness and PD.  Fell yesterday down 3-4 steps. It's  Getting worse. Not getting better on the banjo.   Started [place].  Interest in music daily.   It's so much fun. Hallucinated on amantadine and fell and stopped. Little Ativan. No current treatment for cancer. Questions about timing of quetiapine and gets dizzy and weak Plan: Consider reducing quetiapine ER to minimize SE but risk increase mood problems. Currently seems to be doing ok with depression and mood swings.  Yes bc he gets up at night, REDUCE quetiapine to ER 200 mg nightly.  01/18/20 appt with  following noted: Asks for jury excuse due to Parkinson's Dz with cognitive impairment, alertness and poor memory. Tries to walk every other day.  Latley more back pain has gotten him really down.  Then can think what's the purpose of life.  His family is not supportive.  Wife and son not very helpful.  Had bad feelings about it.  Frustrated with family.  Down for a couple of weeks.  PD progressed last 6 mos and harder at times to move.   Was falling a lot 6 mos ago, but not in the last 6 mos. No difference with reduction in Seroquel and still feels real hung over in the day. Plan: Apparently no worse with reduction quetiapine to ER 200 mg nightly,  DT drowsiness reduce again to 150 mg ER HS  05/01/2020 appointment with the following noted:  More anxiety, but no worsening manic sx.  Sleep MN to 3 AM and then after a few days sleep more.  Same pattern as 6 mos or longer.  No one else notices any changes with reduction.  He notices no changes with PD.  The off times with PD have been more abrupt.  Takes a long time to do things.  Family just thinks he's slow. No changes with mood. 10 years ago slept 8 hour night.  Not that sleepy during the day lately. Rare lorazepam. Plan: Reduce oxcarbazepine to 1/2  tablet at night for 2 weeks and if sleep is unchanged then stop it. The goal is to minimize meds that might interfere with Parkinson's medications or increased risk of falling.  06/18/2020 phone call from patient's wife stating mist he was having more anxiety and panic attacks at night and wondering if it was related to med changes. MD response was that it was possible that oxcarbazepine discontinuance could lead to more anxiety or even mania.  He should let us know if he was having any manic symptoms.  Reviewed the purpose of the med change was to minimize drug interactions with Parkinson's disease medications and therefore rather than restarting the oxcarbazepine we would add lorazepam 0.5 to 1 mg nightly  or every 8 hours as needed anxiety.  And to let us know if that failed.  07/02/2020 appointment with the following noted: About every night 6-7 PM get "dreads".  Dreading wife going to bed and him being alone.  To the point of crying and begging wife to stay up with him.  For the last week using it regularly in the afternoon about 2 mg lorazepam.   New problems since stopping the oxcarbazepine.   PD sx are not better off the Trileptal.  Overall sx seems to get a little worse. Satisfied with meds.  No other new concerns.  Rarely uses Ativan except when goes to doctors' offices or with CT scans Dt claustrophobia. Pt reports that mood is Anxious and describes anxiety as Moderate. Anxiety symptoms include: Excessive Worry,. Pt reports has interrupted sleep, not much change off Trileptal. Pt reports that appetite is good. Pt reports that energy is lethargic and no change. Concentration is down slightly. Suicidal thoughts:  denied by patient.  Less dep and anxiety than last visit. Melanoma responded really well to immunotherapy cancer treatment.   Discussed concerns about the cost of Seroquel Exar and how bad he feels if he runs out of the medication. Plan: Apparently no worse with reduction quetiapine to 150 mg ER HS . Continue it. Less drowsiness now  Call if sleep worsens or mania. Restart oxcarbazepine to 1/2 tablet at night for 1 weeks and then 1 each evening..To stop the sense of dread and anxiety in the evening.  08/02/2020 urgent appointment with the following noted: Several recent phone calls complaining of increased anxiety unresolved by attempts to adjust medication over the phone. Worst panic attacks I've ever had, now daily.  Start early afternoon over worry he'll be alone at night bc wife in bed and he's awake.  Crying.  Can get a claustrophobic feeling and fear of not being able to move and see his feet. Near blackout spells. Out of nowhere.   Only lasts seconds.   Unrealistic fear that  he'll die despite positive exams that don't support the fear. Plan: Apparently worse with reduction quetiapine to 150 mg ER HS bc severe anxiety with fear he'll die.  Nothing better with less medication and in fact worse. Increase quetiapine back to ER 200 mg HS. Less drowsiness now  Call if sleep worsens or mania. Increase oxcarbazepine to 1 tablet in AM and  each evening..To stop the sense of dread and anxiety in the evening.   08/09/2020 phone call:  RTC  Couple good days and now anxiety and panic hit and shaking and afraid to be alone tonight.  Scared of big panic attacks and doesn't want to be alone.  1 week ago increased quetiapine ER 200 HS and oxcarb to 150 mg 1  BID.  Tired and not enough sleep DT panic.  Afraid of being alone. Takes lorazepam prn 2 mg .  Most days taking 2 daily.  Doesn't make him sleepy.  Took it at 4 pm today.    Increase oxcarbazepine to 1 in AM  and 2 at 2 PM and then take 2 mg lorazepam about an hour before anxiety worsens about 3 PM.  Disc Cog techniques.  Lynder Parents, MD, [facility]  09/23/2020 appointment with the following noted: Anxiety worse if anything and especially night.  No sleep since Thursday.  Sleeps in chair bc back pain.  Sleeps downstairs away from wife who goes to bed at 8 pm.  Alone a lot and doesn't like it.   Taking 2 mg lorazepam daily for the last couple of weeks. Takes 20-30 min to kick in. Still some panic but not nightly and serious about once per week. Trouble sleeping bc is anxious and it can hit wiithot reason or build over time.  Usually comes on fast. [facility] started taking him out weekly and that helps. Trying to walk but it's gotten harder.   Rare napping. Jittery feeling in the morning.  PD followed by Dr. Carles Collet dx about 2016 Vaccinated.  Past Psychiatric Medication Trials: Seroquel XR 300 Trileptal  Review of Systems:  Review of Systems  Constitutional:  Positive for fatigue.  Respiratory:  Negative for wheezing.    Cardiovascular:  Negative for chest pain and palpitations.  Genitourinary:        Urinary incontinence and using Depends  Musculoskeletal:  Positive for arthralgias, back pain and gait problem.  Neurological:  Positive for dizziness, tremors, syncope and weakness. Negative for speech difficulty.       Balance problems from PD.  Psychiatric/Behavioral:  Positive for sleep disturbance. Negative for agitation, behavioral problems, confusion, decreased concentration, dysphoric mood, hallucinations, self-injury and suicidal ideas. The patient is nervous/anxious. The patient is not hyperactive.  Frequent falls are a problem.  Can't pick himself up without help.  Medications: I have reviewed the patient's current medications.  Current Outpatient Medications  Medication Sig Dispense Refill   AMBULATORY NON FORMULARY MEDICATION Upright walker Dx: [name] 1 Device 0   Ascorbic Acid (VITAMIN C) 1000 MG tablet Take 1,000 mg by mouth 2 (two) times daily.     B Complex-C (B-COMPLEX WITH VITAMIN C) tablet Take 1 tablet by mouth daily.     carbidopa-levodopa (SINEMET CR) 50-200 MG tablet Take 1 tablet by mouth at bedtime. 90 tablet 1   carbidopa-levodopa (SINEMET IR) 25-100 MG tablet TAKE TWO TABELTS BY MOUTH AT 8AM, NOON, 4PM, AND AT 8PM AS DIRECTED 240 tablet 4   Cholecalciferol (VITAMIN D-3) 5000 UNITS TABS Take 1 tablet by mouth daily.     entacapone (COMTAN) 200 MG tablet Take 1 tablet (200 mg total) by mouth 3 (three) times daily. Take with 8am/noon/4pm dosage of levodopa 270 tablet 1   lamoTRIgine (LAMICTAL) 200 MG tablet Take 1 tablet (200 mg total) by mouth at bedtime. 90 tablet 1   losartan (COZAAR) 50 MG tablet Take 50 mg by mouth daily. Pt. Is unsure of Mg.     magnesium oxide (MAG-OX) 400 MG tablet Take 400 mg by mouth daily.     niacin 500 MG tablet Take 500 mg by mouth daily with breakfast.     Omega-3 Fatty Acids (FISH OIL BURP-LESS PO) Take 1 capsule by mouth in the morning and at bedtime.      OXcarbazepine (TRILEPTAL) 150 MG  tablet Take 1 tablet (150 mg total) by mouth 2 (two) times daily. 180 tablet 1   Potassium 99 MG TABS Take 1 tablet by mouth daily.     pramipexole (MIRAPEX) 0.5 MG tablet Take 1 tablet (0.5 mg total) by mouth 3 (three) times daily. 270 tablet 1   pravastatin (PRAVACHOL) 80 MG tablet Take 80 mg by mouth daily before breakfast.     QUEtiapine (SEROQUEL) 200 MG tablet Take 1 tablet (200 mg total) by mouth at bedtime. 30 tablet 1   LORazepam (ATIVAN) 2 MG tablet Take 1 daily and 6 PM.  Take extra 1 tablet daily if needed for panic 45 tablet 2   No current facility-administered medications for this visit.    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 -"pinkish tan""no odor"   Multiple body piercings    Tremor    RT HAND    Family History  Problem Relation Age of Onset   Leukemia Mother    Healthy Sister    Healthy Sister    Healthy Son    Healthy Son    Healthy Daughter     Social History   Socioeconomic History   Marital status: Married    Spouse name: Not on file   Number of children: 3   Years of education: Not on file   Highest education level: 12th grade  Occupational History   Not on file  Tobacco Use   Smoking status: Never   Smokeless tobacco: Never  Vaping Use   Vaping Use: Never used  Substance and Sexual Activity   Alcohol use: No    Alcohol/week: 1.0 standard drink    Types: 1 Glasses of wine per week    Comment: no alcohol in 2 years   Drug use: No   Sexual activity: Yes  Other Topics Concern   Not on file  Social History Narrative   Right handed   2 Story home    Lives with spouse and son   Social Determinants of Health   Financial Resource Strain: Not on file  Food  Insecurity: Not on file  Transportation Needs: Not on file  Physical Activity: Not on file  Stress: Not on file  Social Connections: Not on file  Intimate Partner Violence: Not on file    Past Medical History, Surgical history, Social history, and Family history were reviewed and updated as appropriate.   Please see review of systems for further details on the patient's review from today.   Objective:   Physical Exam:  There were no vitals taken for this visit.  Physical Exam Constitutional:      General: He is not in acute distress.    Appearance: Normal appearance. He is obese.  Musculoskeletal:        General: No deformity.  Neurological:     Mental Status: He is alert and oriented to person, place, and time.     Coordination: Coordination abnormal.     Gait: Gait abnormal.     Comments: Uses walker for balance  Psychiatric:        Attention and Perception: Attention and perception normal.        Mood and Affect: Mood is anxious and  depressed. Affect is not labile, blunt, angry, tearful or inappropriate.        Speech: Speech normal. Speech is not slurred.        Behavior: Behavior is slowed.        Thought Content: Thought content normal. Thought content is not paranoid. Thought content does not include homicidal or suicidal ideation. Thought content does not include homicidal or suicidal plan.        Cognition and Memory: Cognition normal.        Judgment: Judgment normal.     Comments: Insight intact. No auditory or visual hallucinations. No delusions.  Depression goes up and down with health and situation and seems worse Anxiety is worse in PM    Lab Review:     Component Value Date/Time   NA 141 03/30/2016 1219   K 3.7 03/30/2016 1219   CL 107 03/30/2016 1219   CO2 25 03/30/2016 1219   GLUCOSE 97 03/30/2016 1219   BUN 17 03/30/2016 1219   CREATININE 0.80 03/30/2016 1219   CALCIUM 9.4 03/30/2016 1219   PROT 6.9 03/30/2016 1219   ALBUMIN 4.7 03/30/2016 1219    AST 21 03/30/2016 1219   ALT 27 03/30/2016 1219   ALKPHOS 65 03/30/2016 1219   BILITOT 0.6 03/30/2016 1219   GFRNONAA 86 (L) 10/06/2012 1130   GFRAA >90 10/06/2012 1130       Component Value Date/Time   WBC 5.6 11/26/2014 1133   RBC 4.04 (L) 11/26/2014 1133   HGB 12.8 (L) 11/26/2014 1133   HCT 38.8 (L) 11/26/2014 1133   PLT 195.0 11/26/2014 1133   MCV 96.2 11/26/2014 1133   MCH 31.9 10/06/2012 1130   MCHC 33.0 11/26/2014 1133   RDW 13.6 11/26/2014 1133   LYMPHSABS 1.1 11/26/2014 1133   MONOABS 0.6 11/26/2014 1133   EOSABS 0.1 11/26/2014 1133   BASOSABS 0.0 11/26/2014 1133    Lithium Lvl  Date Value Ref Range Status  08/17/2010 0.44 (L) 0.80 - 1.40 mEq/L Final     No results found for: PHENYTOIN, PHENOBARB, VALPROATE, CBMZ   .res Assessment: Plan:    Panic disorder with agoraphobia - Plan: LORazepam (ATIVAN) 2 MG tablet  Bipolar I disorder, most recent episode depressed ([place]) - Plan: QUEtiapine (SEROQUEL) 200 MG tablet  Generalized anxiety disorder  Insomnia due to mental condition  Mild cognitive impairment  Claustrophobia   Anxiety and insomnia have worsened and remained out of control.    Disc balancing benefit vs SE of Seroquel in view of Parkinson's Dz.   Discussed potential metabolic side effects associated with atypical antipsychotics, as well as potential risk for movement side effects. Advised pt to contact office if movement side effects occur.   Currently seems to be doing ok with mood swings.   Howerver anxiety is much worse and not clear what may be causing it. Chronic insomnia is worse. Try to keep meds as simple as possible.  Apparently worse with reduction quetiapine to 150 mg ER HS bc severe anxiety with fear he'll die.  Nothing better with less medication and in fact worse. Continue quetiapine back to ER 200 mg HS vs switch to IR.  Extensive discussion about differences. Yes for better sleep switch to IR.    FALL precautions. Take  lorazepam 2 mg every evening at 6 PM. Less drowsiness now  Call if sleep worsens or mania. Continue oxcarbazepine to 1 tablet in AM and  each evening..To stop the sense of dread and anxiety in the evening.  Disc pain management and answered questions of opiate use.  May need a pain clinic. Disc fall risk with pain meds.  Supportive therapy and health direction given severe dx PD and malignant melanoma.  And dealing with family's lack of attention.  Enc cont exercise which is helping.  Disc risk of PD meds affecting psych status including  Risk of PD.    We discussed the short-term risks associated with benzodiazepines including sedation and increased fall risk among others.  Discussed long-term side effect risk including dependence, potential withdrawal symptoms, and the potential eventual dose-related risk of dementia.  He is taking more of this.  Disc costs of Rx and how to manage for ex with GoodRx he forgot to look into this and he was reminded about it today.  He was given written information about it.   Patient agrees with this plan.  2 mos  Lynder Parents, MD, [facility]     Please see After Visit Summary for patient specific instructions.  Future Appointments  Date Time Provider [place]  10/30/2020  1:00 PM [name], [name], DO [id number] None    No orders of the defined types were placed in this encounter.     -------------------------------

## 2020-09-24 DIAGNOSIS — D0361 Melanoma in situ of right upper limb, including shoulder: Secondary | ICD-10-CM | POA: Diagnosis not present

## 2020-10-11 ENCOUNTER — Other Ambulatory Visit: Payer: Self-pay | Admitting: Neurology

## 2020-10-11 DIAGNOSIS — G20 Parkinson's disease: Secondary | ICD-10-CM

## 2020-10-24 ENCOUNTER — Other Ambulatory Visit: Payer: Self-pay | Admitting: Neurology

## 2020-10-28 NOTE — Progress Notes (Deleted)
Assessment/Plan:   1.  Parkinsons Disease  -Continue pramipexole 0.5 mg 3 times per day  -Continue carbidopa/levodopa 25/100, 2 po at 8am/noon/4pm/8pm.  I told him that I do not want him to take extra dosages in the middle of the night like he is currently doing.  -add carbidopa/levodopa 50/200 CR at bedtime  -In 2 weeks, we will add entacapone, 200 mg, 1 tablet to the first 3 dosages of his levodopa. 2.  Chronic low back pain             -Has seen neurosurgery and Dr. Letta Pate  -following now with Dr. Rolena Infante and Dr. Nelva Bush with [facility] 3.  Depression             -Following with Dr. Clovis Pu.  Last saw him on August 16.  -On Seroquel XR, 150 mg daily, which may contribute to Parkinson symptoms 4.  History of melanoma, stage IIIb             -Following with dermatology  Subjective:   Bill Guerrero was seen today in follow up for Parkinsonism.  My previous records were reviewed prior to todays visit as well as outside records available to me.  This patient is accompanied in the office by his son who supplements the history.  levodopa increased last visit.  Wife called just a few weeks ago stating that patient's tremors were increasing.  Discussed that I was not sure that there was much more that we could do given that he is not a surgical candidate because of his bipolar disorder.  He last saw Dr. Clovis Pu January 18, 2020.  Notes indicate that patient is "easily overwhelmed."  Dr. Clovis Pu has been working on reducing the quetiapine due to drowsiness.  He dropped it from 200 mg to 150 mg.  He also noted that anxiety was not fully manageable, but felt that medication changes were unlikely to help.  No hallucinations.  Rare lightheadedness.  No near syncope.  Not exercising much.  Quit going to PT due to transportation issues.    Current prescribed movement disorder medications: Pramipexole 0.5 mg 3 times per day Carbidopa/levodopa 25/100, 2 tablets at 8 AM/noon/4 PM/8 PM (increased  last visit)    PREVIOUS MEDICATIONS: Amantadine (patient states that hallucinations and falls but took twice daily dosing for a long time without this and still falls after it)  ALLERGIES:  No Known Allergies  CURRENT MEDICATIONS:  Outpatient Encounter Medications as of 10/30/2020  Medication Sig   AMBULATORY NON FORMULARY MEDICATION Upright walker Dx: [name]   Ascorbic Acid (VITAMIN C) 1000 MG tablet Take 1,000 mg by mouth 2 (two) times daily.   B Complex-C (B-COMPLEX WITH VITAMIN C) tablet Take 1 tablet by mouth daily.   carbidopa-levodopa (SINEMET CR) 50-200 MG tablet Take 1 tablet by mouth at bedtime.   carbidopa-levodopa (SINEMET IR) 25-100 MG tablet TAKE TWO TABELTS BY MOUTH AT 8AM, NOON, 4PM, AND AT 8PM AS DIRECTED   Cholecalciferol (VITAMIN D-3) 5000 UNITS TABS Take 1 tablet by mouth daily.   entacapone (COMTAN) 200 MG tablet Take 1 tablet (200 mg total) by mouth 3 (three) times daily. Take with 8am/noon/4pm dosage of levodopa   lamoTRIgine (LAMICTAL) 200 MG tablet Take 1 tablet (200 mg total) by mouth at bedtime.   LORazepam (ATIVAN) 2 MG tablet Take 1 daily and 6 PM.  Take extra 1 tablet daily if needed for panic   losartan (COZAAR) 50 MG tablet Take 50 mg by mouth daily.  Pt. Is unsure of Mg.   magnesium oxide (MAG-OX) 400 MG tablet Take 400 mg by mouth daily.   niacin 500 MG tablet Take 500 mg by mouth daily with breakfast.   Omega-3 Fatty Acids (FISH OIL BURP-LESS PO) Take 1 capsule by mouth in the morning and at bedtime.   OXcarbazepine (TRILEPTAL) 150 MG tablet Take 1 tablet (150 mg total) by mouth 2 (two) times daily.   Potassium 99 MG TABS Take 1 tablet by mouth daily.   pramipexole (MIRAPEX) 0.5 MG tablet TAKE ONE TABLET BY MOUTH THREE TIMES A DAY   pravastatin (PRAVACHOL) 80 MG tablet Take 80 mg by mouth daily before breakfast.   QUEtiapine (SEROQUEL) 200 MG tablet Take 1 tablet (200 mg total) by mouth at bedtime.   No facility-administered encounter medications on file as  of 10/30/2020.    Objective:   PHYSICAL EXAMINATION:    VITALS:   There were no vitals filed for this visit.   GEN:  The patient appears stated age and is in NAD. HEENT:  Normocephalic, atraumatic.  The mucous membranes are moist. The superficial temporal arteries are without ropiness or tenderness. CV:  RRR Lungs:  CTAB.  Some DOE   Neurological examination:  Orientation: The patient is alert and oriented x3. Cranial nerves: There is good facial symmetry with facial hypomimia. The speech is fluent and clear. Soft palate rises symmetrically and there is no tongue deviation. Hearing is intact to conversational tone. Sensation: Sensation is intact to light touch throughout Motor: Strength is at least antigravity x4.  Movement examination: Tone: There is min increased tone in the RUE Abnormal movements: mild tremor in the bilateral UE; mild dyskinesia in the L foot Coordination:  There is decremation with RAM's, with any form of RAMS, including alternating supination and pronation of the forearm, hand opening and closing, finger taps, heel taps and toe taps bilaterally Gait and Station: Patient pushes off of the chair to arise.  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  He ambulates fairly well with the walker but he does have trouble with the turn   I have reviewed and interpreted the following labs independently    Chemistry      Component Value Date/Time   NA 141 03/30/2016 1219   K 3.7 03/30/2016 1219   CL 107 03/30/2016 1219   CO2 25 03/30/2016 1219   BUN 17 03/30/2016 1219   CREATININE 0.80 03/30/2016 1219      Component Value Date/Time   CALCIUM 9.4 03/30/2016 1219   ALKPHOS 65 03/30/2016 1219   AST 21 03/30/2016 1219   ALT 27 03/30/2016 1219   BILITOT 0.6 03/30/2016 1219       Lab Results  Component Value Date   WBC 5.6 11/26/2014   HGB 12.8 (L) 11/26/2014   HCT 38.8 (L) 11/26/2014   MCV 96.2 11/26/2014   PLT 195.0 11/26/2014    Lab Results  Component Value  Date   TSH 2.11 03/30/2016     Total time spent on today's visit was *** minutes, including both face-to-face time and nonface-to-face time.  Time included that spent on review of records (prior notes available to me/labs/imaging if pertinent), discussing treatment and goals, answering patient's questions and coordinating care.  Cc:  Burnard Bunting, MD

## 2020-10-29 ENCOUNTER — Other Ambulatory Visit: Payer: Self-pay | Admitting: Neurology

## 2020-10-30 ENCOUNTER — Ambulatory Visit: Payer: Medicare Other | Admitting: Neurology

## 2020-10-30 NOTE — Telephone Encounter (Signed)
Enough given only until appt with Dr.Rebecca [name]

## 2020-10-31 ENCOUNTER — Ambulatory Visit: Payer: Medicare Other | Admitting: Neurology

## 2020-11-13 ENCOUNTER — Other Ambulatory Visit: Payer: Self-pay | Admitting: Neurology

## 2020-11-13 DIAGNOSIS — G20 Parkinson's disease: Secondary | ICD-10-CM

## 2020-11-27 ENCOUNTER — Ambulatory Visit: Payer: Medicare Other | Admitting: Psychiatry

## 2020-11-28 ENCOUNTER — Other Ambulatory Visit: Payer: Self-pay | Admitting: Psychiatry

## 2020-11-28 DIAGNOSIS — F3130 Bipolar disorder, current episode depressed, mild or moderate severity, unspecified: Secondary | ICD-10-CM

## 2020-12-02 NOTE — Progress Notes (Signed)
Assessment/Plan:   1.  Parkinsons Disease  -Continue pramipexole 0.5 mg 3 times per day  -Continue carbidopa/levodopa 25/100, 2 po at 8am/noon/4pm/8pm.  I told him that I do not want him to take extra dosages in the middle of the night like he is currently doing.  -continue carbidopa/levodopa 50/200 CR at bedtime  -stop entacapone due to didn't seem to help  -trial opicapone  -Referral to physical therapy  -Discussed again the importance of starting some type of exercise program. 2.  Chronic low back pain             -Has seen neurosurgery and Dr. Letta Pate  -following now with Dr. Rolena Infante and Dr. Nelva Bush with [facility] 3.  Depression             -Following with Dr. Clovis Pu.  Last saw him August 22  -On Seroquel XR, 150 mg daily, which may contribute to Parkinson symptoms 4.  History of melanoma, stage IIIb             -Following with dermatology.  Last seen August 23. 5.  Urinary incontinence, mild  -Discussed with patient that he likely needs to find a urologist.  He is now wearing undergarments.  Subjective:   Bill Guerrero was seen today in follow up for Parkinsonism.  My previous records were reviewed prior to todays visit as well as outside records available to me.  This patient is accompanied in the office by his wife who supplements the history.  Last visit, we added bedtime levodopa and entacapone.  He has had no side effects.  No hallucinations.  He still reports wearing off at 3 hours, which is the reason that the medication was added last visit.  He doesn't think that entacapone has helped.  He does think that the carbidopa/levodopa 50/200 is helping.  No "hard" falls - has had "trips" and "near falls."  He would like to go back to physical therapy.  Current prescribed movement disorder medications: Pramipexole 0.5 mg 3 times per day Carbidopa/levodopa 25/100, 2 tablets at 8 AM/noon/4 PM/8 PM (admits takes them 7am/10/2pm/5pm) Carbidopa/levodopa 50/200 CR at bedtime  (added last visit) Entacapone, 200 mg, 1 tablet with first 3 dosages of levodopa (added last visit)  PREVIOUS MEDICATIONS: Amantadine (patient states that hallucinations and falls but took twice daily dosing for a long time without this and still falls after it); entacapone (didn't seem to help)  ALLERGIES:  No Known Allergies  CURRENT MEDICATIONS:  Outpatient Encounter Medications as of 12/03/2020  Medication Sig   AMBULATORY NON FORMULARY MEDICATION Upright walker Dx: [name]   Ascorbic Acid (VITAMIN C) 1000 MG tablet Take 1,000 mg by mouth 2 (two) times daily.   B Complex-C (B-COMPLEX WITH VITAMIN C) tablet Take 1 tablet by mouth daily.   carbidopa-levodopa (SINEMET CR) 50-200 MG tablet TAKE ONE TABLET BY MOUTH EVERY NIGHT AT BEDTIME   carbidopa-levodopa (SINEMET IR) 25-100 MG tablet TAKE TWO TABELTS BY MOUTH AT 8AM, NOON, 4PM, AND AT 8PM AS DIRECTED   Cholecalciferol (VITAMIN D-3) 5000 UNITS TABS Take 1 tablet by mouth daily.   entacapone (COMTAN) 200 MG tablet TAKE ONE TABLET BY MOUTH THREE TIMES A DAY ALONG WITH LEVODOPA AT 8AM, NOON, AND 4PM   lamoTRIgine (LAMICTAL) 200 MG tablet Take 1 tablet (200 mg total) by mouth at bedtime.   LORazepam (ATIVAN) 2 MG tablet Take 1 daily and 6 PM.  Take extra 1 tablet daily if needed for panic   losartan (COZAAR)  50 MG tablet Take 50 mg by mouth daily. Pt. Is unsure of Mg.   magnesium oxide (MAG-OX) 400 MG tablet Take 400 mg by mouth daily.   niacin 500 MG tablet Take 500 mg by mouth daily with breakfast.   Omega-3 Fatty Acids (FISH OIL BURP-LESS PO) Take 1 capsule by mouth in the morning and at bedtime.   OXcarbazepine (TRILEPTAL) 150 MG tablet Take 1 tablet (150 mg total) by mouth 2 (two) times daily.   Potassium 99 MG TABS Take 1 tablet by mouth daily.   pramipexole (MIRAPEX) 0.5 MG tablet TAKE ONE TABLET BY MOUTH THREE TIMES A DAY   pravastatin (PRAVACHOL) 80 MG tablet Take 80 mg by mouth daily before breakfast.   QUEtiapine (SEROQUEL) 200 MG  tablet TAKE ONE TABLET BY MOUTH EVERY NIGHT AT BEDTIME   No facility-administered encounter medications on file as of 12/03/2020.    Objective:   PHYSICAL EXAMINATION:    VITALS:   Vitals:   12/03/20 1537  BP: (!) 152/80  Pulse: 72  SpO2: 98%  Weight: 275 lb 12.8 oz (125.1 kg)  Height: 6\' 2"  (1.88 m)     GEN:  The patient appears stated age and is in NAD. HEENT:  Normocephalic, atraumatic.  The mucous membranes are moist. The superficial temporal arteries are without ropiness or tenderness. CV:  RRR Lungs:  CTAB.      Neurological examination:  Orientation: The patient is alert and oriented x3. Cranial nerves: There is good facial symmetry with facial hypomimia. The speech is fluent and clear. Soft palate rises symmetrically and there is no tongue deviation. Hearing is intact to conversational tone. Sensation: Sensation is intact to light touch throughout Motor: Strength is at least antigravity x4.  Movement examination: Tone: There is normal tone today Abnormal movements: rare tremor today in the RUE.  No dyskinesia Coordination:  There is decremation with RAM's, with any form of RAMS, including alternating supination and pronation of the forearm, hand opening and closing, finger taps, heel taps and toe taps bilaterally Gait and Station: Patient pushes off of the chair to arise.  He ambulates well with the upright walker  I have reviewed and interpreted the following labs independently    Chemistry      Component Value Date/Time   NA 141 03/30/2016 1219   K 3.7 03/30/2016 1219   CL 107 03/30/2016 1219   CO2 25 03/30/2016 1219   BUN 17 03/30/2016 1219   CREATININE 0.80 03/30/2016 1219      Component Value Date/Time   CALCIUM 9.4 03/30/2016 1219   ALKPHOS 65 03/30/2016 1219   AST 21 03/30/2016 1219   ALT 27 03/30/2016 1219   BILITOT 0.6 03/30/2016 1219       Lab Results  Component Value Date   WBC 5.6 11/26/2014   HGB 12.8 (L) 11/26/2014   HCT 38.8 (L)  11/26/2014   MCV 96.2 11/26/2014   PLT 195.0 11/26/2014    Lab Results  Component Value Date   TSH 2.11 03/30/2016     Total time spent on today's visit was 32 minutes, including both face-to-face time and nonface-to-face time.  Time included that spent on review of records (prior notes available to me/labs/imaging if pertinent), discussing treatment and goals, answering patient's questions and coordinating care.  Cc:  Burnard Bunting, MD

## 2020-12-03 ENCOUNTER — Other Ambulatory Visit: Payer: Self-pay

## 2020-12-03 ENCOUNTER — Ambulatory Visit (INDEPENDENT_AMBULATORY_CARE_PROVIDER_SITE_OTHER): Payer: Medicare Other | Admitting: Neurology

## 2020-12-03 ENCOUNTER — Encounter: Payer: Self-pay | Admitting: Neurology

## 2020-12-03 VITALS — BP 152/80 | HR 72 | Ht 74.0 in | Wt 275.8 lb

## 2020-12-03 DIAGNOSIS — G20 Parkinson's disease: Secondary | ICD-10-CM | POA: Diagnosis not present

## 2020-12-03 DIAGNOSIS — F331 Major depressive disorder, recurrent, moderate: Secondary | ICD-10-CM

## 2020-12-03 MED ORDER — OPICAPONE 50 MG PO CAPS: 50.0000 mg | ORAL_CAPSULE | Freq: Every day | ORAL | 0 refills | Status: DC

## 2020-12-03 MED ORDER — CARBIDOPA-LEVODOPA 25-100 MG PO TABS: ORAL_TABLET | ORAL | 1 refills | Status: DC

## 2020-12-03 MED ORDER — OPICAPONE 50 MG PO CAPS: 1.0000 | ORAL_CAPSULE | Freq: Every day | ORAL | 1 refills | Status: DC

## 2020-12-03 NOTE — Patient Instructions (Addendum)
Stop entacapone Start opicapone, 50 mg at bedtime No changes in your other medications.  Take carbidopa/levodopa 25/100, 2 at 7am/10/2pm/5pm Take carbidopa/levodopa 50/200 CR at bed Pramipexole 0.5 mg 3 times per day

## 2020-12-12 ENCOUNTER — Other Ambulatory Visit: Payer: Self-pay | Admitting: Neurology

## 2020-12-22 DIAGNOSIS — Z23 Encounter for immunization: Secondary | ICD-10-CM | POA: Diagnosis not present

## 2020-12-26 ENCOUNTER — Other Ambulatory Visit: Payer: Self-pay | Admitting: Psychiatry

## 2020-12-26 DIAGNOSIS — F3130 Bipolar disorder, current episode depressed, mild or moderate severity, unspecified: Secondary | ICD-10-CM

## 2020-12-27 ENCOUNTER — Other Ambulatory Visit: Payer: Self-pay | Admitting: Psychiatry

## 2020-12-27 DIAGNOSIS — F4001 Agoraphobia with panic disorder: Secondary | ICD-10-CM

## 2021-01-06 ENCOUNTER — Telehealth: Payer: Self-pay | Admitting: Neurology

## 2021-01-06 ENCOUNTER — Other Ambulatory Visit: Payer: Self-pay

## 2021-01-06 DIAGNOSIS — G20 Parkinson's disease: Secondary | ICD-10-CM

## 2021-01-06 MED ORDER — ONGENTYS 50 MG PO CAPS: 50.0000 mg | ORAL_CAPSULE | Freq: Every day | ORAL | 0 refills | Status: DC

## 2021-01-06 NOTE — Telephone Encounter (Signed)
Called patient we are providing samples today for patient but we have reached out to [employer] and we need the denial letters for the ogentys so we can get those sent in to the patient assistance program to get enrolled

## 2021-01-06 NOTE — Telephone Encounter (Signed)
Pt called in stating he was given samples of Ongentys until his insurance approved the prescription. He said he just ran out of samples. Can he get more samples?

## 2021-01-09 ENCOUNTER — Telehealth: Payer: Self-pay

## 2021-01-09 NOTE — Telephone Encounter (Signed)
New message - website CoverMyMeds   [employer] Part D is unable to respond with clinical questions. Please see more information at the bottom of the page for next steps.  JOHSUA SHEVLIN (Key: [id number]) Ongentys 50MG  capsules   Form [employer] Electronic PA Form [phone] NCPDP) Created 2 minutes ago Sent to Plan less than a minute ago Plan Response less than a minute ago Submit Clinical Questions Determination Message from Plan Your PA request cannot be processed for the member plan submitted. For further inquiries please contact the number on the back of the member prescription card. (Message [phone])

## 2021-01-09 NOTE — Telephone Encounter (Addendum)
F/u   Token  # [id number]   Ref # [id number]  Call Medicare Provider Line at [phone].  Directed to website [employer] / Part B Medicare / Select Forms / Prior Authorization    Per Fantasia's suggestion I contacted the patient due to Rx Plan to obtain the phone number for authorization there is no need to fill out the form.   I called and left a detailed message on the patient home voicemail at  [phone] to contact me with the Rx Plan phone number to obtain authorization for the medication Opicapone (ONGENTYS) 50 MG CAPS.

## 2021-01-10 NOTE — Telephone Encounter (Signed)
F/u   I called the patient at  [phone] left a message to call me back at  [phone].

## 2021-01-10 NOTE — Telephone Encounter (Signed)
January 09, 2021 Me   GD    8:53 AM Note F/u    Token  # [id number]    Ref # [id number]  Call Medicare Provider Line at [phone].  Directed to website [employer] / Part B Medicare / Select Forms / Prior Authorization    Per Fantasia's suggestion I contacted the patient due to Rx Plan to obtain the phone number for authorization there is no need to fill out the form.   I called and left a detailed message on the patient home voicemail at  [phone] to contact me with the Rx Plan phone number to obtain authorization for the medication Opicapone (ONGENTYS) 50 MG CAPS.

## 2021-02-20 ENCOUNTER — Telehealth: Payer: Self-pay | Admitting: Neurology

## 2021-02-20 ENCOUNTER — Other Ambulatory Visit: Payer: Self-pay | Admitting: Psychiatry

## 2021-02-20 DIAGNOSIS — F4001 Agoraphobia with panic disorder: Secondary | ICD-10-CM

## 2021-02-20 NOTE — Telephone Encounter (Signed)
Pt wife called and give number to call [employer] back because she has been reaching out to them about the medication Opicapone. Per Dr [name] we are not give pt more samples because the pt is not helping his self nor doing what we recommend by doing therapy or taken the medication properly,

## 2021-02-20 NOTE — Telephone Encounter (Signed)
Pt's wife called in stating the patient is struggling. He is having a hard time moving around, getting out of a chair, and can't get to the bathroom. He has also been on some sample medication and he is out of it. She does not remember the name of that medication.

## 2021-02-24 ENCOUNTER — Other Ambulatory Visit: Payer: Self-pay | Admitting: Psychiatry

## 2021-02-24 ENCOUNTER — Telehealth: Payer: Self-pay | Admitting: Psychiatry

## 2021-02-24 DIAGNOSIS — F3130 Bipolar disorder, current episode depressed, mild or moderate severity, unspecified: Secondary | ICD-10-CM

## 2021-02-24 NOTE — Telephone Encounter (Signed)
Bill Guerrero Pt's wife called reporting Pt is crying and saying he wants to die. She has given him 2 Lorazepam. He doesn't want you to leave the room. Has apt 1/25. Wife doesn't know what to do until then. Contact # [phone]. Asking for a call back.

## 2021-02-24 NOTE — Telephone Encounter (Signed)
This message was sent as high priority but pt is stable right now. After she gave him 2 of the Lorazepam he did calm down, but remains nervous. Pt request she call. He does have apt Wednesday. Pt's wife reports pt is very anxious and nervous and doesn't like to be left alone in a room or by himself at the house. Very fearful. She reports she stayed home with him today but will be returning to work tomorrow. She reports this has been happening off and on for 2 weeks, pt is sleeping well at night. No other symptoms reported.   I instructed her okay to give lorazepam later this evening or night if needed and sounded like while she is at work he will also need it for being alone. Informed her I would touch base with Dr. Clovis Pu and if he recommended anything prior to apt. She didn't feel there probably was anything.

## 2021-02-24 NOTE — Telephone Encounter (Signed)
Continue lorazepam one half of a 2 mg tablet 3 times daily as needed anxiety until appointment in 2 days.  We may consider clonidine.

## 2021-02-24 NOTE — Telephone Encounter (Signed)
Please see message. You are better qualified to handle this call I think.

## 2021-02-25 NOTE — Telephone Encounter (Signed)
Left Treva a voicemail with instructions and to call me back with further concerns

## 2021-02-26 ENCOUNTER — Ambulatory Visit (INDEPENDENT_AMBULATORY_CARE_PROVIDER_SITE_OTHER): Payer: Medicare Other | Admitting: Psychiatry

## 2021-02-26 ENCOUNTER — Encounter: Payer: Self-pay | Admitting: Psychiatry

## 2021-02-26 ENCOUNTER — Other Ambulatory Visit: Payer: Self-pay

## 2021-02-26 VITALS — BP 128/81 | HR 73

## 2021-02-26 DIAGNOSIS — G3184 Mild cognitive impairment, so stated: Secondary | ICD-10-CM

## 2021-02-26 DIAGNOSIS — F4001 Agoraphobia with panic disorder: Secondary | ICD-10-CM

## 2021-02-26 DIAGNOSIS — F411 Generalized anxiety disorder: Secondary | ICD-10-CM | POA: Diagnosis not present

## 2021-02-26 DIAGNOSIS — F5105 Insomnia due to other mental disorder: Secondary | ICD-10-CM

## 2021-02-26 DIAGNOSIS — F3130 Bipolar disorder, current episode depressed, mild or moderate severity, unspecified: Secondary | ICD-10-CM

## 2021-02-26 DIAGNOSIS — F40240 Claustrophobia: Secondary | ICD-10-CM

## 2021-02-26 MED ORDER — CLONIDINE HCL 0.1 MG PO TABS: ORAL_TABLET | ORAL | 1 refills | Status: DC

## 2021-02-26 NOTE — Progress Notes (Signed)
Bill Guerrero [id number] [birth year] 71 y.o.  Subjective:   Patient ID:  Bill Guerrero is a 71 y.o. (DOB 02/10/1950) male.  Chief Complaint:  Chief Complaint  Patient presents with   Follow-up   Anxiety    Out of control   Depression    HPI Bill Guerrero presents to the office today for follow-up of mood disorder, anxiety, and chronic insomnia.  He is chronically stressed and down due to the combination of dealing with Parkinson's disease and history of cancer.  seen in march 2021.  No meds were changed. The following was noted: Started playing banjo and taking lessons.  Practices daily.   Stress many things he can no longer do physically.  Can't do housework like before.  Getting him down.  Pushing himself to walk with a walker.  Pain in walking without walker.   Has done PT and finished. Accidentally dropped dose of Trileptal for ? Time frame without a problem to 150 mg daily.  No mood swings noticed.   "Made it longer than they thought I would".  Still in remission with melanoma with follow up tomorrow.   But hard to get around.  Still has bad days emotionally up to a week at a time.  Anxiety is worse than depression.  Easily overwhelmed.   Pattern of EMA at 3 am and then falls asleep watching TV.  Become a habit. Then naps.   Occ forgets Seroquel.  09/18/19 appt with the following noted: Bad day with weakness and PD.  Fell yesterday down 3-4 steps. It's  Getting worse. Not getting better on the banjo.   Started [place].  Interest in music daily.   It's so much fun. Hallucinated on amantadine and fell and stopped. Little Ativan. No current treatment for cancer. Questions about timing of quetiapine and gets dizzy and weak Plan: Consider reducing quetiapine ER to minimize SE but risk increase mood problems. Currently seems to be doing ok with depression and mood swings.  Yes bc he gets up at night, REDUCE quetiapine to ER 200 mg nightly.  01/18/20 appt with  following noted: Asks for jury excuse due to Parkinson's Dz with cognitive impairment, alertness and poor memory. Tries to walk every other day.  Latley more back pain has gotten him really down.  Then can think what's the purpose of life.  His family is not supportive.  Wife and son not very helpful.  Had bad feelings about it.  Frustrated with family.  Down for a couple of weeks.  PD progressed last 6 mos and harder at times to move.   Was falling a lot 6 mos ago, but not in the last 6 mos. No difference with reduction in Seroquel and still feels real hung over in the day. Plan: Apparently no worse with reduction quetiapine to ER 200 mg nightly,  DT drowsiness reduce again to 150 mg ER HS  05/01/2020 appointment with the following noted:  More anxiety, but no worsening manic sx.  Sleep MN to 3 AM and then after a few days sleep more.  Same pattern as 6 mos or longer.  No one else notices any changes with reduction.  He notices no changes with PD.  The off times with PD have been more abrupt.  Takes a long time to do things.  Family just thinks he's slow. No changes with mood. 10 years ago slept 8 hour night.  Not that sleepy during the day lately. Rare lorazepam. Plan: Reduce oxcarbazepine  to 1/2 tablet at night for 2 weeks and if sleep is unchanged then stop it. The goal is to minimize meds that might interfere with Parkinson's medications or increased risk of falling.  06/18/2020 phone call from patient's wife stating mist he was having more anxiety and panic attacks at night and wondering if it was related to med changes. MD response was that it was possible that oxcarbazepine discontinuance could lead to more anxiety or even mania.  He should let us know if he was having any manic symptoms.  Reviewed the purpose of the med change was to minimize drug interactions with Parkinson's disease medications and therefore rather than restarting the oxcarbazepine we would add lorazepam 0.5 to 1 mg nightly  or every 8 hours as needed anxiety.  And to let us know if that failed.  07/02/2020 appointment with the following noted: About every night 6-7 PM get "dreads".  Dreading wife going to bed and him being alone.  To the point of crying and begging wife to stay up with him.  For the last week using it regularly in the afternoon about 2 mg lorazepam.   New problems since stopping the oxcarbazepine.   PD sx are not better off the Trileptal.  Overall sx seems to get a little worse. Satisfied with meds.  No other new concerns.  Rarely uses Ativan except when goes to doctors' offices or with CT scans Dt claustrophobia. Pt reports that mood is Anxious and describes anxiety as Moderate. Anxiety symptoms include: Excessive Worry,. Pt reports has interrupted sleep, not much change off Trileptal. Pt reports that appetite is good. Pt reports that energy is lethargic and no change. Concentration is down slightly. Suicidal thoughts:  denied by patient.  Less dep and anxiety than last visit. Melanoma responded really well to immunotherapy cancer treatment.   Discussed concerns about the cost of Seroquel Exar and how bad he feels if he runs out of the medication. Plan: Apparently no worse with reduction quetiapine to 150 mg ER HS . Continue it. Less drowsiness now  Call if sleep worsens or mania. Restart oxcarbazepine to 1/2 tablet at night for 1 weeks and then 1 each evening..To stop the sense of dread and anxiety in the evening.  08/02/2020 urgent appointment with the following noted: Several recent phone calls complaining of increased anxiety unresolved by attempts to adjust medication over the phone. Worst panic attacks I've ever had, now daily.  Start early afternoon over worry he'll be alone at night bc wife in bed and he's awake.  Crying.  Can get a claustrophobic feeling and fear of not being able to move and see his feet. Near blackout spells. Out of nowhere.   Only lasts seconds.   Unrealistic fear that  he'll die despite positive exams that don't support the fear. Plan: Apparently worse with reduction quetiapine to 150 mg ER HS bc severe anxiety with fear he'll die.  Nothing better with less medication and in fact worse. Increase quetiapine back to ER 200 mg HS. Less drowsiness now  Call if sleep worsens or mania. Increase oxcarbazepine to 1 tablet in AM and  each evening..To stop the sense of dread and anxiety in the evening.   08/09/2020 phone call:  RTC  Couple good days and now anxiety and panic hit and shaking and afraid to be alone tonight.  Scared of big panic attacks and doesn't want to be alone.  1 week ago increased quetiapine ER 200 HS and oxcarb to 150  mg 1 BID.  Tired and not enough sleep DT panic.  Afraid of being alone. Takes lorazepam prn 2 mg .  Most days taking 2 daily.  Doesn't make him sleepy.  Took it at 4 pm today.    Increase oxcarbazepine to 1 in AM  and 2 at 2 PM and then take 2 mg lorazepam about an hour before anxiety worsens about 3 PM.  Disc Cog techniques.  Lynder Parents, MD, [facility]  09/23/2020 appointment with the following noted: Anxiety worse if anything and especially night.  No sleep since Thursday.  Sleeps in chair bc back pain.  Sleeps downstairs away from wife who goes to bed at 8 pm.  Alone a lot and doesn't like it.   Taking 2 mg lorazepam daily for the last couple of weeks. Takes 20-30 min to kick in. Still some panic but not nightly and serious about once per week. Trouble sleeping bc is anxious and it can hit wiithot reason or build over time.  Usually comes on fast. [facility] started taking him out weekly and that helps. Trying to walk but it's gotten harder.   Rare napping. Jittery feeling in the morning. Plan: Apparently worse with reduction quetiapine to 150 mg ER HS bc severe anxiety with fear he'll die.  Nothing better with less medication and in fact worse. Continue quetiapine back to ER 200 mg HS vs switch to IR.  Extensive discussion about  differences. Yes for better sleep switch to IR.    FALL precautions. Take lorazepam 2 mg every evening at 6 PM. Less drowsiness now  Call if sleep worsens or mania. Continue oxcarbazepine to 1 tablet in AM and  each evening..To stop the sense of dread and anxiety in the evening.  02/24/2021 phone call: Charlesetta Ivory Pt's wife called reporting Pt is crying and saying he wants to die. Nurse contact with wife: This message was sent as high priority but pt is stable right now. After she gave him 2 of the Lorazepam he did calm down, but remains nervous. Pt request she call. He does have apt Wednesday. Pt's wife reports pt is very anxious and nervous and doesn't like to be left alone in a room or by himself at the house. Very fearful. She reports she stayed home with him today but will be returning to work tomorrow. She reports this has been happening off and on for 2 weeks, pt is sleeping well at night. No other symptoms reported.   I instructed her okay to give lorazepam later this evening or night if needed and sounded like while she is at work he will also need it for being alone. Informed her I would touch base with Dr. Clovis Pu and if he recommended anything prior to apt. She didn't feel there probably was anything.  MD response: Patient is apparently not suicidal just highly distressed.  Agree with lorazepam 2 mg 3 times daily until the appointment in 2 days. May consider clonidine  02/26/2021 appointment with the following noted: Even after Ativan 2 mg anxiety is bad and never been this bad.  Can't stand to be alone, even if wife in the house but sleeping upstairs.  Yesterday beg wife for help.  Afraid he'll die in his sleep. Only Ativan 2 mg TID once.   Kind of drugged feeling for the past few weeks. Taking Seroquel 200 mg HS, lamotrigine 200 mg daily, oxcarbazepine 150 BID, pramipexole 0.5 mg TID Sleep better with switch to Seroquel IR 200 mg HS  PD followed by Dr. Carles Collet dx about 2016 Vaccinated.  Past  Psychiatric Medication Trials: Seroquel XR 300 Trileptal, lamotrigine Klonopin, Ativan  Review of Systems:  Review of Systems  Constitutional:  Positive for fatigue.  Cardiovascular:  Negative for chest pain and palpitations.  Genitourinary:        Urinary incontinence and using Depends  Musculoskeletal:  Positive for arthralgias, back pain and gait problem.  Neurological:  Positive for dizziness, tremors, syncope and weakness. Negative for speech difficulty.       Balance problems from PD.  Psychiatric/Behavioral:  Positive for sleep disturbance. Negative for agitation, behavioral problems, confusion, decreased concentration, dysphoric mood, hallucinations, self-injury and suicidal ideas. The patient is nervous/anxious. The patient is not hyperactive.  Frequent falls are a problem.  Can't pick himself up without help.  Medications: I have reviewed the patient's current medications.  Current Outpatient Medications  Medication Sig Dispense Refill   AMBULATORY NON FORMULARY MEDICATION Upright walker Dx: [name] 1 Device 0   Ascorbic Acid (VITAMIN C) 1000 MG tablet Take 1,000 mg by mouth 2 (two) times daily.     B Complex-C (B-COMPLEX WITH VITAMIN C) tablet Take 1 tablet by mouth daily.     carbidopa-levodopa (SINEMET CR) 50-200 MG tablet TAKE ONE TABLET BY MOUTH EVERY NIGHT AT BEDTIME 90 tablet 1   carbidopa-levodopa (SINEMET IR) 25-100 MG tablet 2 at 7am/10/2pm/5pm 720 tablet 1   Cholecalciferol (VITAMIN D-3) 5000 UNITS TABS Take 1 tablet by mouth daily.     cloNIDine (CATAPRES) 0.1 MG tablet 1/2 at night for 2 nights, then 1/2 tablet twice daily for 4 nights, then 1/2 in the AM and 1 tablet at night for 4 nights then 1 twice daily 60 tablet 1   lamoTRIgine (LAMICTAL) 200 MG tablet Take 1 tablet (200 mg total) by mouth at bedtime. 90 tablet 1   LORazepam (ATIVAN) 2 MG tablet TAKE ONE TABLET BY MOUTH EVERY EVENING AT 6PM MAY TAKE 1 EXTRA TABLET DAILY AS NEEDED FOR PANIC 45 tablet 1   losartan  (COZAAR) 50 MG tablet Take 50 mg by mouth daily. Pt. Is unsure of Mg.     magnesium oxide (MAG-OX) 400 MG tablet Take 400 mg by mouth daily.     niacin 500 MG tablet Take 500 mg by mouth daily with breakfast.     Omega-3 Fatty Acids (FISH OIL BURP-LESS PO) Take 1 capsule by mouth in the morning and at bedtime.     Opicapone (ONGENTYS) 50 MG CAPS Take 50 mg by mouth daily. Samples of this drug were given to the patient, quantity 4, Lot Number [id number]/30/2025 30 capsule 0   Opicapone 50 MG CAPS Take 50 mg by mouth at bedtime. Samples of this drug were given to the patient, quantity 2, Lot Number [id number] 10/02/2023 Samples of this drug were given to the patient, quantity 1, Lot Number [id number] 08/13/2023 7 capsule 0   Opicapone 50 MG CAPS Take 50 mg by mouth daily. 30 capsule 0   OXcarbazepine (TRILEPTAL) 150 MG tablet Take 1 tablet (150 mg total) by mouth 2 (two) times daily. 180 tablet 1   Potassium 99 MG TABS Take 1 tablet by mouth daily.     pramipexole (MIRAPEX) 0.5 MG tablet TAKE ONE TABLET BY MOUTH THREE TIMES A DAY 270 tablet 1   pravastatin (PRAVACHOL) 80 MG tablet Take 80 mg by mouth daily before breakfast.     QUEtiapine (SEROQUEL) 200 MG tablet TAKE ONE TABLET BY MOUTH EVERY  NIGHT AT BEDTIME 30 tablet 0   No current facility-administered medications for this visit.    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 -"pinkish tan""no odor"   Multiple body piercings    Tremor    RT HAND    Family History  Problem Relation Age of Onset   Leukemia Mother    Healthy Sister    Healthy Sister    Healthy Son    Healthy Son    Healthy Daughter     Social History   Socioeconomic History   Marital status: Married    Spouse name: Not on  file   Number of children: 3   Years of education: Not on file   Highest education level: 12th grade  Occupational History   Not on file  Tobacco Use   Smoking status: Never   Smokeless tobacco: Never  Vaping Use   Vaping Use: Never used  Substance and Sexual Activity   Alcohol use: No    Alcohol/week: 1.0 standard drink    Types: 1 Glasses of wine per week    Comment: no alcohol in 2 years   Drug use: No   Sexual activity: Yes  Other Topics Concern   Not on file  Social History Narrative   Right handed   2 Story home    Lives with spouse and son   Social Determinants of Health   Financial Resource Strain: Not on file  Food Insecurity: Not on file  Transportation Needs: Not on file  Physical Activity: Not on file  Stress: Not on file  Social Connections: Not on file  Intimate Partner Violence: Not on file    Past Medical History, Surgical history, Social history, and Family history were reviewed and updated as appropriate.   Please see review of systems for further details on the patient's review from today.   Objective:   Physical Exam:  BP 128/81    Pulse 73   Physical Exam Constitutional:      General: He is not in acute distress.    Appearance: Normal appearance. He is obese.  Musculoskeletal:        General: No deformity.  Neurological:     Mental Status: He is alert and oriented to person, place, and time.     Coordination: Coordination abnormal.     Gait: Gait abnormal.     Comments: Uses walker for balance  Psychiatric:        Attention and Perception: Attention and perception normal.        Mood and Affect: Mood is anxious and depressed. Affect is not labile, blunt, angry, tearful or inappropriate.        Speech: Speech normal. Speech is not slurred.        Behavior: Behavior is slowed.        Thought Content: Thought content normal. Thought content is not paranoid or delusional. Thought content does not include homicidal or suicidal ideation.  Thought content does not include suicidal plan.        Cognition and Memory: Cognition normal.        Judgment: Judgment normal.     Comments: Insight intact. No auditory or visual hallucinations. No delusions.  Depression  goes up and down with health and situation and worse Anxiety is worse lately walker    Lab Review:     Component Value Date/Time   NA 141 03/30/2016 1219   K 3.7 03/30/2016 1219   CL 107 03/30/2016 1219   CO2 25 03/30/2016 1219   GLUCOSE 97 03/30/2016 1219   BUN 17 03/30/2016 1219   CREATININE 0.80 03/30/2016 1219   CALCIUM 9.4 03/30/2016 1219   PROT 6.9 03/30/2016 1219   ALBUMIN 4.7 03/30/2016 1219   AST 21 03/30/2016 1219   ALT 27 03/30/2016 1219   ALKPHOS 65 03/30/2016 1219   BILITOT 0.6 03/30/2016 1219   GFRNONAA 86 (L) 10/06/2012 1130   GFRAA >90 10/06/2012 1130       Component Value Date/Time   WBC 5.6 11/26/2014 1133   RBC 4.04 (L) 11/26/2014 1133   HGB 12.8 (L) 11/26/2014 1133   HCT 38.8 (L) 11/26/2014 1133   PLT 195.0 11/26/2014 1133   MCV 96.2 11/26/2014 1133   MCH 31.9 10/06/2012 1130   MCHC 33.0 11/26/2014 1133   RDW 13.6 11/26/2014 1133   LYMPHSABS 1.1 11/26/2014 1133   MONOABS 0.6 11/26/2014 1133   EOSABS 0.1 11/26/2014 1133   BASOSABS 0.0 11/26/2014 1133    Lithium Lvl  Date Value Ref Range Status  08/17/2010 0.44 (L) 0.80 - 1.40 mEq/L Final     No results found for: PHENYTOIN, PHENOBARB, VALPROATE, CBMZ   .res Assessment: Plan:    Bipolar I disorder, most recent episode depressed ([place]) - Plan: cloNIDine (CATAPRES) 0.1 MG tablet  Panic disorder with agoraphobia - Plan: cloNIDine (CATAPRES) 0.1 MG tablet  Generalized anxiety disorder - Plan: cloNIDine (CATAPRES) 0.1 MG tablet  Insomnia due to mental condition - Plan: cloNIDine (CATAPRES) 0.1 MG tablet  Claustrophobia - Plan: cloNIDine (CATAPRES) 0.1 MG tablet  Mild cognitive impairment   Anxiety and insomnia have worsened and remained out of control.    Disc  balancing benefit vs SE of Seroquel in view of Parkinson's Dz.   Discussed potential metabolic side effects associated with atypical antipsychotics, as well as potential risk for movement side effects. Advised pt to contact office if movement side effects occur.   anxiety is much worse and not clear what may be causing it. Chronic insomnia is worse. More panic to unmanageable degree.  More dependence behavior bc of anxiety Disc off label use of clonidine for bipolar and anxiety. Push fluids and fall precautions Clonidine 0.1 mg tablet 1/2 at night for 2 nights, then 1/2 tablet twice daily for 4 nights, then 1/2 in the AM and 1 tablet at night for 4 nights then 1 twice daily  worse with reduction quetiapine to 150 mg ER HS bc severe anxiety with fear he'll die.  Nothing better with less medication and in fact worse. Continue quetiapine  200 mg HS IR.   FALL precautions. Take lorazepam 2 mg TID until better with clonidine Less drowsiness now  Call if sleep worsens or mania. Continue oxcarbazepine to 1 tablet in AM and  each evening..To stop the sense of dread and anxiety in the evening.  Disc pain management and answered questions of opiate use.  May need a pain clinic. Disc fall risk with pain meds.  Supportive therapy and health direction given severe dx PD and malignant melanoma.  And dealing with family's lack of attention.  Enc cont exercise which is helping.  Disc risk of PD meds affecting psych status including  Risk of PD.  We  discussed the short-term risks associated with benzodiazepines including sedation and increased fall risk among others.  Discussed long-term side effect risk including dependence, potential withdrawal symptoms, and the potential eventual dose-related risk of dementia.  But recent studies from 2020 dispute this association between benzodiazepines and dementia risk. Newer studies in 2020 do not support an association with dementia.  He is taking more of this.  Disc  costs of Rx and how to manage for ex with GoodRx he forgot to look into this and he was reminded about it today.  He was given written information about it.   Patient agrees with this plan.  ASAP  Lynder Parents, MD, DFAPA     Please see After Visit Summary for patient specific instructions.  Future Appointments  Date Time Provider [place]  06/05/2021  3:30 PM [name], [name], DO [facility] None    No orders of the defined types were placed in this encounter.      -------------------------------

## 2021-02-26 NOTE — Patient Instructions (Addendum)
Start clonidine as on the bottle for anxiety Continue other meds. Lots of water.

## 2021-03-05 ENCOUNTER — Telehealth: Payer: Self-pay | Admitting: Neurology

## 2021-03-05 ENCOUNTER — Other Ambulatory Visit: Payer: Self-pay | Admitting: Neurology

## 2021-03-05 DIAGNOSIS — G20 Parkinson's disease: Secondary | ICD-10-CM

## 2021-03-05 NOTE — Telephone Encounter (Signed)
Pt called in and left a message with the access nurse today at 1:18 PM. He had been given some samples that he doesn't remember the name of and has been out for 3 weeks and would like to know what to do from here? Feeling very sleepy. Please see full access nurse report in Dr. Doristine Devoid box.

## 2021-03-06 NOTE — Telephone Encounter (Signed)
Called patient and left message that Patients wife called on the 19th of January and Nira Conn let her know that the PA department had reached out on several times to gather more information to get patient approved for [employer]. I called and left patient message on this information and that [employer] has also tried to reach out to enroll patient in program

## 2021-03-06 NOTE — Telephone Encounter (Signed)
I reviewed notes in patients file and there are several documentation notes from [place] in [place] trying to contact patient in order to get him a PA to receive his Opicapone. Patient has not returned any phone calls to help to get this medication.

## 2021-03-21 ENCOUNTER — Telehealth: Payer: Self-pay | Admitting: Psychiatry

## 2021-03-21 NOTE — Telephone Encounter (Signed)
LVM to RC 

## 2021-03-21 NOTE — Telephone Encounter (Signed)
Pt's wife LVM.  She is on DPR.  She said Dr Clovis Pu recently prescribed something for him.  He seems to be sleepier.  He had been taking Lorazepam as needed and then increase it to daily.  Now that Dr. Clovis Pu has him taking a new med, he's doesn't remember how he should take the Lorazepam, (daily or as needed).  They just need clarification on how to handle this.  Next appt 3/28

## 2021-03-24 ENCOUNTER — Other Ambulatory Visit: Payer: Self-pay | Admitting: Psychiatry

## 2021-03-24 DIAGNOSIS — F3130 Bipolar disorder, current episode depressed, mild or moderate severity, unspecified: Secondary | ICD-10-CM

## 2021-03-24 NOTE — Telephone Encounter (Signed)
Wife said patient is drugged up with the lorazepam and clonidine. She is asking if he can cut back on the lorazepam.   Take lorazepam 2 mg TID until better with clonidine Less drowsiness now

## 2021-03-24 NOTE — Telephone Encounter (Signed)
He can reduce the lorazepam gradually.  He cannot skip dosages otherwise he will get withdrawal anxiety.  He can reduce lorazepam to 1/2 tablet 3 times daily and 1 tablet at night for 1 week and then 1/2 tablet 4 times daily which would be 1/2 tablet with each meal and 1/2 tablet at night.  Let us know if he has a problem

## 2021-03-24 NOTE — Telephone Encounter (Signed)
Called over the weekend.  I called back and had to LVM.

## 2021-03-25 NOTE — Telephone Encounter (Signed)
LVM for patient to RC.

## 2021-03-26 NOTE — Telephone Encounter (Signed)
Left message on answering machine. 

## 2021-04-03 ENCOUNTER — Other Ambulatory Visit: Payer: Self-pay | Admitting: Neurology

## 2021-04-03 DIAGNOSIS — G20A1 Parkinson's disease without dyskinesia, without mention of fluctuations: Secondary | ICD-10-CM

## 2021-04-03 DIAGNOSIS — G20 Parkinson's disease: Secondary | ICD-10-CM

## 2021-04-17 ENCOUNTER — Telehealth: Payer: Self-pay | Admitting: Psychiatry

## 2021-04-17 NOTE — Telephone Encounter (Signed)
Rtc to pt and he reports crying the last 3 days, depression has gotten worse. He just changed Neurologist, was seeing Dr. Carles Collet but now going to [facility] seeing a neurologist believe he said Dr. Coralyn Pear or something like that? His parkinson's is worse and it's really affecting him. He is no longer on the Mirapex. He said it's really effecting his home life, it's not good.  ? ?Informed him I would talk to Dr. Clovis Pu and call him back with recommendation. ?

## 2021-04-17 NOTE — Telephone Encounter (Signed)
Patient's sister rtc regarding prior messages. Says that Bill Guerrero is very anxious and she is unsure of what to tell him. Pls rtc [phone].  ?

## 2021-04-17 NOTE — Telephone Encounter (Signed)
Pt's sister Bill Guerrero called at 9:15 reporting Pt is having a hard time with depression and Parkinson's. Yesterday was talking about harming himself. Parkinson's has advanced in last year or so. Home life not good. Wife there but she is a sleep and not a good caregiver, sister stated. Asking for [place] to reach out to pt at [phone]. Bill Guerrero has been talking to him this morning, but she is at work. Bill Guerrero work # [phone]. Release not on file for Bill Guerrero, she is on some providers, just haven't completed one for [place]. Ask her to work on getting ROI for [place]. Apt 3/28.  ?

## 2021-04-17 NOTE — Telephone Encounter (Signed)
RTC ? ?LM. Will call again.  LM we will come up with options. ? ?Lynder Parents, MD, DFAPA ? ?

## 2021-04-17 NOTE — Telephone Encounter (Signed)
Please review

## 2021-04-18 MED ORDER — LITHIUM CARBONATE ER 300 MG PO TBCR: 300.0000 mg | EXTENDED_RELEASE_TABLET | Freq: Every evening | ORAL | 1 refills | Status: DC

## 2021-04-18 MED ORDER — LAMOTRIGINE 100 MG PO TABS: ORAL_TABLET | ORAL | 1 refills | Status: DC

## 2021-04-18 NOTE — Telephone Encounter (Signed)
Dr. Clovis Pu wants to speak to him. He left him a message yesterday. ?

## 2021-04-18 NOTE — Telephone Encounter (Signed)
Pt lvm at 12:16 pm that he is still waiting for dr. Clovis Pu to call him. Please call him at [phone] ?

## 2021-04-18 NOTE — Telephone Encounter (Signed)
Pt LVM 5:28pm 3/16 stating he was waiting on call all day and need to talk with nurse. Contact Pt  [phone]  ?

## 2021-04-18 NOTE — Telephone Encounter (Signed)
RTC [phone].  ?Started getting depressed again about 4 days ago.  No SI but purposelessness.   ?04-07-21 neuro started reducing pramipexole to switch bc hypersexual thoughts causing problems with his wife.  ?Right now depression worse than anxiety.  Doesn't want to be left alone.  Trouble keeping up meds.   ? ?Can't tell if clonidine helps anxiety but is taking lorazepam only a couple of times. ? ?Accidentally stopped lamotrigine at some point and doesn't know when. ?Restart lamotrigine and increase to 100 mg BID ? ?Counseled patient regarding potential benefits, risks, and side effects of Lamictal to include potential risk of Stevens-Johnson syndrome. Advised patient to stop taking Lamictal and contact office immediately if rash develops and to seek urgent medical attention if rash is severe and/or spreading quickly. Will start Lamictal 25 mg daily for 2 weeks, then increase to 50 mg daily for 2 weeks, then 100 mg daily for 2 weeks, then 150 mg daily for mood symptoms.  ? ?Add low dose lithium to speed recover and for SI 300 mg daily. ? ?He agrees to the plan ? ?Lynder Parents, MD, DFAPA ? ? ? ?

## 2021-04-18 NOTE — Telephone Encounter (Signed)
Please call.

## 2021-04-21 ENCOUNTER — Other Ambulatory Visit: Payer: Self-pay | Admitting: Psychiatry

## 2021-04-21 DIAGNOSIS — F3130 Bipolar disorder, current episode depressed, mild or moderate severity, unspecified: Secondary | ICD-10-CM

## 2021-04-23 ENCOUNTER — Other Ambulatory Visit: Payer: Self-pay | Admitting: Psychiatry

## 2021-04-23 DIAGNOSIS — F3130 Bipolar disorder, current episode depressed, mild or moderate severity, unspecified: Secondary | ICD-10-CM

## 2021-04-23 DIAGNOSIS — F40240 Claustrophobia: Secondary | ICD-10-CM

## 2021-04-23 DIAGNOSIS — F5105 Insomnia due to other mental disorder: Secondary | ICD-10-CM

## 2021-04-23 DIAGNOSIS — F411 Generalized anxiety disorder: Secondary | ICD-10-CM

## 2021-04-23 DIAGNOSIS — F4001 Agoraphobia with panic disorder: Secondary | ICD-10-CM

## 2021-04-24 ENCOUNTER — Other Ambulatory Visit: Payer: Self-pay | Admitting: Psychiatry

## 2021-04-24 DIAGNOSIS — F4001 Agoraphobia with panic disorder: Secondary | ICD-10-CM

## 2021-04-25 ENCOUNTER — Telehealth: Payer: Self-pay | Admitting: Psychiatry

## 2021-04-25 NOTE — Telephone Encounter (Signed)
Bill Guerrero lvm stating he is still experiencing depression,panic attacks and crying a lot . He stated Dr Clovis Pu did make medication adjustments, but he is afraid of the pending weekend. See telephone encounter 3/17. Please advise patient. # [phone]. ?

## 2021-04-25 NOTE — Telephone Encounter (Signed)
LVM to RC. We did refill lorazepam yesterday.  ?

## 2021-04-25 NOTE — Telephone Encounter (Signed)
Left another VM for patient and also LVM on wife's mobile #.  ?

## 2021-04-27 ENCOUNTER — Other Ambulatory Visit: Payer: Self-pay | Admitting: Neurology

## 2021-04-27 DIAGNOSIS — G20 Parkinson's disease: Secondary | ICD-10-CM

## 2021-04-28 NOTE — Telephone Encounter (Signed)
Left VM on patient's number. Wife's work number is no longer in service. Her mobile # goes straight to VM, but I left another message for her also.   ?

## 2021-04-28 NOTE — Telephone Encounter (Signed)
Next appt if 06/05/2021, enough given until appt, refills can be obtained at visit ?

## 2021-04-29 ENCOUNTER — Ambulatory Visit (INDEPENDENT_AMBULATORY_CARE_PROVIDER_SITE_OTHER): Payer: Medicare Other | Admitting: Psychiatry

## 2021-04-29 ENCOUNTER — Other Ambulatory Visit: Payer: Self-pay

## 2021-04-29 ENCOUNTER — Encounter: Payer: Self-pay | Admitting: Psychiatry

## 2021-04-29 DIAGNOSIS — F3130 Bipolar disorder, current episode depressed, mild or moderate severity, unspecified: Secondary | ICD-10-CM | POA: Diagnosis not present

## 2021-04-29 DIAGNOSIS — F40240 Claustrophobia: Secondary | ICD-10-CM | POA: Diagnosis not present

## 2021-04-29 DIAGNOSIS — G3184 Mild cognitive impairment, so stated: Secondary | ICD-10-CM

## 2021-04-29 DIAGNOSIS — F5105 Insomnia due to other mental disorder: Secondary | ICD-10-CM

## 2021-04-29 DIAGNOSIS — F411 Generalized anxiety disorder: Secondary | ICD-10-CM | POA: Diagnosis not present

## 2021-04-29 DIAGNOSIS — F4001 Agoraphobia with panic disorder: Secondary | ICD-10-CM

## 2021-04-29 MED ORDER — QUETIAPINE FUMARATE 300 MG PO TABS: 300.0000 mg | ORAL_TABLET | Freq: Every day | ORAL | 1 refills | Status: DC

## 2021-04-29 NOTE — Progress Notes (Signed)
Drezden Seitzinger Hartstein ?[id number] ?1950-03-05 ?71 y.o. ? ?Subjective:  ? ?Patient ID:  Bill Guerrero is a 71 y.o. (DOB Dec 21, 1950) male. ? ?Chief Complaint:  ?Chief Complaint  ?Patient presents with  ? Follow-up  ? Depression  ? Anxiety  ? Stress  ? ? ?HPI ?Mohmed Farver Alsteen presents to the office today for follow-up of mood disorder, anxiety, and chronic insomnia. ? ?He is chronically stressed and down due to the combination of dealing with Parkinson's disease and history of cancer. ? ?seen in march 2021.  No meds were changed. ?The following was noted: ?Started playing banjo and taking lessons.  Practices daily.   ?Stress many things he can no longer do physically.  Can't do housework like before.  Getting him down.  Pushing himself to walk with a walker.  Pain in walking without walker.   Has done PT and finished. ?Accidentally dropped dose of Trileptal for ? Time frame without a problem to 150 mg daily.  No mood swings noticed.   ?"Made it longer than they thought I would".  Still in remission with melanoma with follow up tomorrow. ?  But hard to get around.  ?Still has bad days emotionally up to a week at a time.  Anxiety is worse than depression.  Easily overwhelmed.   ?Pattern of EMA at 3 am and then falls asleep watching TV.  Become a habit. Then naps.   ?Occ forgets Seroquel. ? ?09/18/19 appt with the following noted: ?Bad day with weakness and PD.  Fell yesterday down 3-4 steps. It's  Getting worse. ?Not getting better on the banjo.   Started [place].  Interest in music daily.   ?It's so much fun. ?Hallucinated on amantadine and fell and stopped. ?Little Ativan. ?No current treatment for cancer. ?Questions about timing of quetiapine and gets dizzy and weak ?Plan: Consider reducing quetiapine ER to minimize SE but risk increase mood problems. ?Currently seems to be doing ok with depression and mood swings.  ?Yes bc he gets up at night, ?REDUCE quetiapine to ER 200 mg nightly. ? ?01/18/20 appt with following  noted: ?Asks for jury excuse due to Parkinson's Dz with cognitive impairment, alertness and poor memory. ?Tries to walk every other day.  Latley more back pain has gotten him really down.  Then can think what's the purpose of life.  His family is not supportive.  Wife and son not very helpful.  Had bad feelings about it.  Frustrated with family.  Down for a couple of weeks.  PD progressed last 6 mos and harder at times to move.   ?Was falling a lot 6 mos ago, but not in the last 6 mos. ?No difference with reduction in Seroquel and still feels real hung over in the day. ?Plan: Apparently no worse with reduction quetiapine to ER 200 mg nightly,  DT drowsiness reduce again to 150 mg ER HS ? ?05/01/2020 appointment with the following noted: ? More anxiety, but no worsening manic sx.  Sleep MN to 3 AM and then after a few days sleep more.  Same pattern as 6 mos or longer.  No one else notices any changes with reduction.  He notices no changes with PD.  The off times with PD have been more abrupt.  Takes a long time to do things.  Family just thinks he's slow. ?No changes with mood. ?10 years ago slept 8 hour night.  Not that sleepy during the day lately. ?Rare lorazepam. ?Plan: Reduce oxcarbazepine to 1/2 tablet  at night for 2 weeks and if sleep is unchanged then stop it. ?The goal is to minimize meds that might interfere with Parkinson's medications or increased risk of falling. ? ?06/18/2020 phone call from patient's wife stating mist he was having more anxiety and panic attacks at night and wondering if it was related to med changes. ?MD response was that it was possible that oxcarbazepine discontinuance could lead to more anxiety or even mania.  He should let us know if he was having any manic symptoms.  Reviewed the purpose of the med change was to minimize drug interactions with Parkinson's disease medications and therefore rather than restarting the oxcarbazepine we would add lorazepam 0.5 to 1 mg nightly or every 8  hours as needed anxiety.  And to let us know if that failed. ? ?07/02/2020 appointment with the following noted: ?About every night 6-7 PM get "dreads".  Dreading wife going to bed and him being alone.  To the point of crying and begging wife to stay up with him.  For the last week using it regularly in the afternoon about 2 mg lorazepam.   ?New problems since stopping the oxcarbazepine.   ?PD sx are not better off the Trileptal.  Overall sx seems to get a little worse. ?Satisfied with meds.  No other new concerns.  Rarely uses Ativan except when goes to doctors' offices or with CT scans Dt claustrophobia. ?Pt reports that mood is Anxious and describes anxiety as Moderate. Anxiety symptoms include: Excessive Worry,. Pt reports has interrupted sleep, not much change off Trileptal. Pt reports that appetite is good. Pt reports that energy is lethargic and no change. Concentration is down slightly. Suicidal thoughts:  denied by patient.  Less dep and anxiety than last visit. ?Melanoma responded really well to immunotherapy cancer treatment.   ?Discussed concerns about the cost of Seroquel Exar and how bad he feels if he runs out of the medication. ?Plan: Apparently no worse with reduction quetiapine to 150 mg ER HS . Continue it. ?Less drowsiness now  ?Call if sleep worsens or mania. ?Restart oxcarbazepine to 1/2 tablet at night for 1 weeks and then 1 each evening..To stop the sense of dread and anxiety in the evening. ? ?08/02/2020 urgent appointment with the following noted: ?Several recent phone calls complaining of increased anxiety unresolved by attempts to adjust medication over the phone. ?Worst panic attacks I've ever had, now daily.  Start early afternoon over worry he'll be alone at night bc wife in bed and he's awake.  Crying.  Can get a claustrophobic feeling and fear of not being able to move and see his feet. ?Near blackout spells. Out of nowhere.   Only lasts seconds.   ?Unrealistic fear that he'll die  despite positive exams that don't support the fear. ?Plan: Apparently worse with reduction quetiapine to 150 mg ER HS bc severe anxiety with fear he'll die.  Nothing better with less medication and in fact worse. ?Increase quetiapine back to ER 200 mg HS. ?Less drowsiness now  ?Call if sleep worsens or mania. ?Increase oxcarbazepine to 1 tablet in AM and  each evening..To stop the sense of dread and anxiety in the evening. ?  ?08/09/2020 phone call:  RTC ? Couple good days and now anxiety and panic hit and shaking and afraid to be alone tonight.  Scared of big panic attacks and doesn't want to be alone. ? 1 week ago increased quetiapine ER 200 HS and oxcarb to 150 mg 1 BID. ?  Tired and not enough sleep DT panic.  Afraid of being alone. ?Takes lorazepam prn 2 mg .  Most days taking 2 daily.  Doesn't make him sleepy.  Took it at 4 pm today.   ? Increase oxcarbazepine to 1 in AM  and 2 at 2 PM and then take 2 mg lorazepam about an hour before anxiety worsens about 3 PM. ? Disc Cog techniques. ? Lynder Parents, MD, DFAPA ? ?09/23/2020 appointment with the following noted: ?Anxiety worse if anything and especially night.  No sleep since Thursday.  Sleeps in chair bc back pain.  Sleeps downstairs away from wife who goes to bed at 8 pm.  Alone a lot and doesn't like it.   ?Taking 2 mg lorazepam daily for the last couple of weeks. Takes 20-30 min to kick in. ?Still some panic but not nightly and serious about once per week. ?Trouble sleeping bc is anxious and it can hit wiithot reason or build over time.  Usually comes on fast. ?[facility] started taking him out weekly and that helps. ?Trying to walk but it's gotten harder.   ?Rare napping. ?Jittery feeling in the morning. ?Plan: Apparently worse with reduction quetiapine to 150 mg ER HS bc severe anxiety with fear he'll die.  Nothing better with less medication and in fact worse. ?Continue quetiapine back to ER 200 mg HS vs switch to IR.  Extensive discussion about  differences. ?Yes for better sleep switch to IR.    FALL precautions. ?Take lorazepam 2 mg every evening at 6 PM. ?Less drowsiness now  ?Call if sleep worsens or mania. ?Continue oxcarbazepine to 1 tablet in AM and

## 2021-04-29 NOTE — Telephone Encounter (Signed)
Patient has an appt with Dr. Clovis Pu today.  ?

## 2021-04-29 NOTE — Patient Instructions (Signed)
Increase quetiapine to 300 mg nightly for depressions ?

## 2021-05-05 ENCOUNTER — Telehealth: Payer: Self-pay | Admitting: Neurology

## 2021-05-05 NOTE — Telephone Encounter (Signed)
Called patients wife was unable to reach left voicemail; message to take patient to ER and to let PCP know of these sudden symptoms  ?

## 2021-05-05 NOTE — Telephone Encounter (Signed)
Patients wife called and stated Bill Guerrero is not doing good.  He is not controlling his bowels, he cant walk , and cannot hold anything.  She didn't know if she should take him to the hospital or what she should do. ?

## 2021-05-19 ENCOUNTER — Telehealth: Payer: Self-pay | Admitting: Psychiatry

## 2021-05-19 NOTE — Telephone Encounter (Signed)
Pt's sister Dortha Kern called reporting Orrin's wife Charlesetta Ivory passed in her sleep 05/10/21. Pt not doing well. She was his caregiver. ?

## 2021-05-20 NOTE — Telephone Encounter (Signed)
Would you like me to call the pt?  ?

## 2021-05-20 NOTE — Telephone Encounter (Signed)
Thank you but I would wait a few days.  He's got to be very busy with funeral arrangements etc right now. ?

## 2021-05-21 ENCOUNTER — Encounter (HOSPITAL_BASED_OUTPATIENT_CLINIC_OR_DEPARTMENT_OTHER): Payer: Self-pay | Admitting: Emergency Medicine

## 2021-05-21 ENCOUNTER — Other Ambulatory Visit: Payer: Self-pay | Admitting: Psychiatry

## 2021-05-21 ENCOUNTER — Emergency Department (HOSPITAL_BASED_OUTPATIENT_CLINIC_OR_DEPARTMENT_OTHER)
Admission: EM | Admit: 2021-05-21 | Discharge: 2021-05-21 | Disposition: A | Discharge status: Home / Self Care | Payer: Medicare Other | Attending: Emergency Medicine | Admitting: Emergency Medicine

## 2021-05-21 ENCOUNTER — Emergency Department (HOSPITAL_BASED_OUTPATIENT_CLINIC_OR_DEPARTMENT_OTHER): Payer: Medicare Other

## 2021-05-21 DIAGNOSIS — W1839XA Other fall on same level, initial encounter: Secondary | ICD-10-CM | POA: Diagnosis not present

## 2021-05-21 DIAGNOSIS — W19XXXA Unspecified fall, initial encounter: Secondary | ICD-10-CM

## 2021-05-21 DIAGNOSIS — G20 Parkinson's disease: Secondary | ICD-10-CM | POA: Diagnosis not present

## 2021-05-21 DIAGNOSIS — F411 Generalized anxiety disorder: Secondary | ICD-10-CM

## 2021-05-21 DIAGNOSIS — F4001 Agoraphobia with panic disorder: Secondary | ICD-10-CM

## 2021-05-21 DIAGNOSIS — F3130 Bipolar disorder, current episode depressed, mild or moderate severity, unspecified: Secondary | ICD-10-CM

## 2021-05-21 DIAGNOSIS — Z79899 Other long term (current) drug therapy: Secondary | ICD-10-CM | POA: Diagnosis not present

## 2021-05-21 DIAGNOSIS — F40240 Claustrophobia: Secondary | ICD-10-CM

## 2021-05-21 DIAGNOSIS — M25511 Pain in right shoulder: Secondary | ICD-10-CM | POA: Diagnosis present

## 2021-05-21 DIAGNOSIS — I10 Essential (primary) hypertension: Secondary | ICD-10-CM | POA: Diagnosis not present

## 2021-05-21 DIAGNOSIS — F5105 Insomnia due to other mental disorder: Secondary | ICD-10-CM

## 2021-05-21 NOTE — ED Triage Notes (Addendum)
Pt's wife was found dead in bed about a week ago. She was primary caregiver and sons have stepped in trying to take over care. Pt has advanced Parkinsons disease. He has been getting his meds. Pt was outside with walker and found down in yard by sons. He denies hitting his head but reports right shoulder pain. He has fallen recently and hit this shoulder several times. Sons are really hoping for nursing home placement.  ?

## 2021-05-21 NOTE — ED Provider Notes (Signed)
?[facility] ?Provider Note ? ?CSN: [id number] ?Arrival date & time: 05/21/21 0045 ? ?Chief Complaint(s) ?Fall ? ?HPI ?Bill Guerrero is a 71 y.o. male with h/o Parkinson's dz  ? ? ?Fall ?This is a recurrent problem. The current episode started 3 to 5 hours ago. Episode frequency: once. Pertinent negatives include no chest pain, no abdominal pain, no headaches and no shortness of breath. Associated symptoms comments: Right shoulder pain. Exacerbated by: abducting >90degree. Relieved by: immobility. He has tried nothing for the symptoms.  ? ?No AC use. No head trauma. No LOC. ?Family is look for assistance with home health vs placement. Patient's wife passed away recently and sons have taken over care for patient.  ? ?Past Medical History ?Past Medical History:  ?Diagnosis Date  ? Arthritis   ? hips replacement  ? Bipolar 1 disorder ([name])   ? Hyperlipemia   ? Hypertension   ? Infection of prosthesis ([name])   ? 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 Multiple body piercings   ? Tremor   ? RT HAND  ? ?Patient Active Problem List  ? Diagnosis Date Noted  ? Chronic midline low back pain without sciatica 12/06/2017  ? Lumbar post-laminectomy syndrome 12/06/2017  ? Cervical post-laminectomy syndrome 12/06/2017  ? Parkinson's disease ([place]) 12/06/2017  ? 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 Hyperlipemia   ? Bipolar 1 disorder ([name])   ? ?Home Medication(s) ?Prior to Admission medications   ?Medication Sig Start Date End Date Taking? Authorizing Provider  ?AMBULATORY NON FORMULARY MEDICATION Upright walker ?Dx: [name] 10/27/19   [name], DO  ?Ascorbic Acid (VITAMIN C) 1000 MG tablet Take 1,000 mg by mouth 2 (two) times daily.    [provider]  ?B Complex-C (B-COMPLEX WITH VITAMIN C) tablet Take 1 tablet by mouth daily.    [provider]  ?carbidopa-levodopa (SINEMET CR) 50-200 MG tablet TAKE ONE TABLET BY MOUTH EVERY  NIGHT AT BEDTIME 12/12/20   [name], [name], DO  ?carbidopa-levodopa (SINEMET IR) 25-100 MG tablet TAKE 2 TABLETS BY MOUTH AT 8AM, 2 TABLETS AT NOON, 2 TABLETS AT 4PM, AND 2 TABLETS AT 8PM 04/28/21   [name], [name], DO  ?Cholecalciferol (VITAMIN D-3) 5000 UNITS TABS Take 1 tablet by mouth daily.    [provider]  ?cloNIDine (CATAPRES) 0.1 MG tablet TAKE ONE TABLET BY MOUTH TWICE A DAY 05/21/21   [name], [name], MD  ?lamoTRIgine (LAMICTAL) 100 MG tablet 1/2 tablet daily for 1 week then 1 tablet daily for 1 week then 1 and 1/2 tablet daily for 1 week then 1 tablet twice daily ?Patient taking differently: Take 100 mg by mouth 2 (two) times daily. 04/18/21   [name], [name], MD  ?lithium carbonate (LITHOBID) 300 MG CR tablet Take 1 tablet (300 mg total) by mouth at bedtime. 04/18/21   [name], [name], MD  ?LORazepam (ATIVAN) 2 MG tablet Take 0.5 tablets (1 mg total) by mouth every 6 (six) hours as needed for anxiety. 04/24/21   [name], [name], MD  ?losartan (COZAAR) 50 MG tablet Take 50 mg by mouth daily. Pt. Is unsure of Mg.    [provider]  ?magnesium oxide (MAG-OX) 400 MG tablet Take 400 mg by mouth daily.    [provider]  ?niacin 500 MG tablet Take 500 mg by mouth daily with breakfast.    [provider]  ?Omega-3 Fatty  Acids (FISH OIL BURP-LESS PO) Take 1 capsule by mouth in the morning and at bedtime.    [provider]  ?Opicapone (ONGENTYS) 50 MG CAPS Take 50 mg by mouth daily. Samples of this drug were given to the patient, quantity 4, Lot Number [id number] 01/06/21   [name], [name], DO  ?Opicapone 50 MG CAPS Take 50 mg by mouth at bedtime. Samples of this drug were given to the patient, quantity 2, Lot Number [id number] 10/02/2023 ?Samples of this drug were given to the patient, quantity 1, Lot Number [id number] 08/13/2023 12/03/20   [name], [name], DO  ?Opicapone 50 MG CAPS Take 50 mg by mouth daily. 12/03/20   [name], DO   ?OXcarbazepine (TRILEPTAL) 150 MG tablet Take 1 tablet (150 mg total) by mouth 2 (two) times daily. 08/02/20   [name], [name], MD  ?Potassium 99 MG TABS Take 1 tablet by mouth daily.    [provider]  ?pramipexole (MIRAPEX) 0.5 MG tablet TAKE ONE TABLET BY MOUTH THREE TIMES A DAY 04/03/21   [name], [name], DO  ?pravastatin (PRAVACHOL) 80 MG tablet Take 80 mg by mouth daily before breakfast.    [provider]  ?QUEtiapine (SEROQUEL) 200 MG tablet TAKE ONE TABLET BY MOUTH EVERY NIGHT AT BEDTIME 05/21/21   [name], [name], MD  ?QUEtiapine (SEROQUEL) 300 MG tablet Take 1 tablet (300 mg total) by mouth at bedtime. 04/29/21   [name], [name], MD  ?                                                                                                                                  ?Allergies ?Patient has no known allergies. ? ?Review of Systems ?Review of Systems  ?Respiratory:  Negative for shortness of breath.   ?Cardiovascular:  Negative for chest pain.  ?Gastrointestinal:  Negative for abdominal pain.  ?Neurological:  Negative for headaches.  ?As noted in HPI ? ?Physical Exam ?Vital Signs  ?I have reviewed the triage vital signs ?BP 123/71   Pulse (!) 59   SpO2 100%  ? ?Physical Exam ?Constitutional:   ?   General: He is not in acute distress. ?   Appearance: He is well-developed. He is not diaphoretic.  ?HENT:  ?   Head: Normocephalic.  ?   Right Ear: External ear normal.  ?   Left Ear: External ear normal.  ?Eyes:  ?   General: No scleral icterus.    ?   Right eye: No discharge.     ?   Left eye: No discharge.  ?   Conjunctiva/sclera: Conjunctivae normal.  ?   Pupils: Pupils are equal, round, and reactive to light.  ?Cardiovascular:  ?   Rate and Rhythm: Regular rhythm.  ?   Pulses:     ?     Radial pulses are 2+ on the right side and 2+  on the left side.  ?     Dorsalis pedis pulses are 2+ on the right side and 2+ on the left side.  ?   Heart sounds: Normal heart sounds. No murmur heard. ?   No friction rub. No gallop.  ?Pulmonary:  ?   Effort: Pulmonary effort is normal. No respiratory distress.  ?   Breath sounds: Normal breath sounds. No stridor.  ?Abdominal:  ?   General: There is no distension.  ?   Palpations: Abdomen is soft.  ?   Tenderness: There is no abdominal tenderness.  ?Musculoskeletal:  ?   Right shoulder: No swelling, deformity, laceration, tenderness or bony tenderness. Decreased range of motion (pain with abducted beyond 90degrees). Normal strength. Normal pulse.  ?   Left shoulder: Normal strength. Normal pulse.  ?   Cervical back: Normal range of motion and neck supple. No bony tenderness.  ?   Thoracic back: No bony tenderness.  ?   Lumbar back: No bony tenderness.  ?   Comments: Clavicle stable. ?Chest stable to AP/Lat compression. ?Pelvis stable to Lat compression. ?No obvious extremity deformity. ?No chest or abdominal wall contusion.  ?Skin: ?   General: Skin is warm.  ?Neurological:  ?   Mental Status: He is alert and oriented to person, place, and time.  ?   GCS: GCS eye subscore is 4. GCS verbal subscore is 5. GCS motor subscore is 6.  ?   Motor: Tremor and abnormal muscle tone (Lead pipe ridgity) present.  ?   Comments: Moving all extremities ? ?  ? ? ?ED Results and Treatments ?Labs ?(all labs ordered are listed, but only abnormal results are displayed) ?Labs Reviewed - No data to display                                                                                                                       ?EKG ? EKG Interpretation ? ?Date/Time:    ?Ventricular Rate:    ?PR Interval:    ?QRS Duration:   ?QT Interval:    ?QTC Calculation:   ?R Axis:     ?Text Interpretation:   ?  ? ?  ? ?Radiology ?No results found. ? ?Pertinent labs & imaging results that were available during my care of the patient were reviewed by me and considered in my medical decision making (see MDM for details). ? ?Medications Ordered in ED ?Medications - No data to display                                                                ?                                                                    ?  Procedures ?Procedures ? ?(including critical care time) ? ?Medical Decision Making / ED Course ? ? ? Comp

## 2021-05-21 NOTE — ED Notes (Signed)
Pt has taken his nighttime meds- lithium, seroquel, lorazepam ?

## 2021-05-22 ENCOUNTER — Telehealth: Payer: Medicare Other | Admitting: Psychiatry

## 2021-05-22 ENCOUNTER — Telehealth (HOSPITAL_BASED_OUTPATIENT_CLINIC_OR_DEPARTMENT_OTHER): Payer: Self-pay | Admitting: Emergency Medicine

## 2021-05-22 NOTE — Telephone Encounter (Signed)
Reordering TOC consult and face to face for HH/placement ?

## 2021-05-23 ENCOUNTER — Encounter: Payer: Self-pay | Admitting: Psychiatry

## 2021-05-23 ENCOUNTER — Ambulatory Visit (INDEPENDENT_AMBULATORY_CARE_PROVIDER_SITE_OTHER): Payer: Medicare Other | Admitting: Psychiatry

## 2021-05-23 DIAGNOSIS — F3130 Bipolar disorder, current episode depressed, mild or moderate severity, unspecified: Secondary | ICD-10-CM

## 2021-05-23 DIAGNOSIS — F5105 Insomnia due to other mental disorder: Secondary | ICD-10-CM

## 2021-05-23 DIAGNOSIS — G3184 Mild cognitive impairment, so stated: Secondary | ICD-10-CM

## 2021-05-23 DIAGNOSIS — F4001 Agoraphobia with panic disorder: Secondary | ICD-10-CM | POA: Diagnosis not present

## 2021-05-23 DIAGNOSIS — F411 Generalized anxiety disorder: Secondary | ICD-10-CM

## 2021-05-23 DIAGNOSIS — F40240 Claustrophobia: Secondary | ICD-10-CM

## 2021-05-23 MED ORDER — QUETIAPINE FUMARATE ER 200 MG PO TB24: 200.0000 mg | ORAL_TABLET | Freq: Every day | ORAL | 1 refills | Status: DC

## 2021-05-23 NOTE — Progress Notes (Signed)
Bill Guerrero ?[id number] ?[birth year] ?71 y.o. ? ?Virtual Visit via Telephone Note ? ?I connected with pt by telephone and verified that I am speaking with the correct person using two identifiers. ?  ?I discussed the limitations, risks, security and privacy concerns of performing an evaluation and management service by telephone and the availability of in person appointments. I also discussed with the patient that there may be a patient responsible charge related to this service. The patient expressed understanding and agreed to proceed. ? ?I discussed the assessment and treatment plan with the patient. The patient was provided an opportunity to ask questions and all were answered. The patient agreed with the plan and demonstrated an understanding of the instructions. ?  ?The patient was advised to call back or seek an in-person evaluation if the symptoms worsen or if the condition fails to improve as anticipated. ? ?I provided 30 minutes of non-face-to-face time during this encounter. The call started at 1130 and ended at 1200. The patient was located at home and the provider was located office. ? ? ?Subjective:  ? ?Patient ID:  Bill Guerrero is a 71 y.o. (DOB 09-Sep-1950) male. ? ?Chief Complaint:  ?Chief Complaint  ?Patient presents with  ? Follow-up  ? Stress  ?  Bill Guerrero's death  ? Anxiety  ? Depression  ? Sleeping Problem  ? ? ?HPI ?Bill Guerrero presents to the office today for follow-up of mood disorder, anxiety, and chronic insomnia. ? ?He is chronically stressed and down due to the combination of dealing with Parkinson's disease and history of cancer. ? ?seen in march 2021.  No meds were changed. ?The following was noted: ?Started playing banjo and taking lessons.  Practices daily.   ?Stress many things he can no longer do physically.  Can't do housework like before.  Getting him down.  Pushing himself to walk with a walker.  Pain in walking without walker.   Has done PT and finished. ?Accidentally  dropped dose of Trileptal for ? Time frame without a problem to 150 mg daily.  No mood swings noticed.   ?"Made it longer than they thought I would".  Still in remission with melanoma with follow up tomorrow. ?  But hard to get around.  ?Still has bad days emotionally up to a week at a time.  Anxiety is worse than depression.  Easily overwhelmed.   ?Pattern of EMA at 3 am and then falls asleep watching TV.  Become a habit. Then naps.   ?Occ forgets Seroquel. ? ?09/18/19 appt with the following noted: ?Bad day with weakness and PD.  Fell yesterday down 3-4 steps. It's  Getting worse. ?Not getting better on the banjo.   Started Ukelele.  Interest in music daily.   ?It's so much fun. ?Hallucinated on amantadine and fell and stopped. ?Little Ativan. ?No current treatment for cancer. ?Questions about timing of quetiapine and gets dizzy and weak ?Plan: Consider reducing quetiapine ER to minimize SE but risk increase mood problems. ?Currently seems to be doing ok with depression and mood swings.  ?Yes bc he gets up at night, ?REDUCE quetiapine to ER 200 mg nightly. ? ?01/18/20 appt with following noted: ?Asks for jury excuse due to Parkinson's Dz with cognitive impairment, alertness and poor memory. ?Tries to walk every other day.  Latley more back pain has gotten him really down.  Then can think what's the purpose of life.  His family is not supportive.  Bill Guerrero and son not very helpful.  Had bad feelings about it.  Frustrated with family.  Down for a couple of weeks.  PD progressed last 6 mos and harder at times to move.   ?Was falling a lot 6 mos ago, but not in the last 6 mos. ?No difference with reduction in Seroquel and still feels real hung over in the day. ?Plan: Apparently no worse with reduction quetiapine to ER 200 mg nightly,  DT drowsiness reduce again to 150 mg ER HS ? ?05/01/2020 appointment with the following noted: ? More anxiety, but no worsening manic sx.  Sleep MN to 3 AM and then after a few days sleep  more.  Same pattern as 6 mos or longer.  No one else notices any changes with reduction.  He notices no changes with PD.  The off times with PD have been more abrupt.  Takes a long time to do things.  Family just thinks he's slow. ?No changes with mood. ?10 years ago slept 8 hour night.  Not that sleepy during the day lately. ?Rare lorazepam. ?Plan: Reduce oxcarbazepine to 1/2 tablet at night for 2 weeks and if sleep is unchanged then stop it. ?The goal is to minimize meds that might interfere with Parkinson's medications or increased risk of falling. ? ?06/18/2020 phone call from patient's Bill Guerrero stating mist he was having more anxiety and panic attacks at night and wondering if it was related to med changes. ?MD response was that it was possible that oxcarbazepine discontinuance could lead to more anxiety or even mania.  He should let us know if he was having any manic symptoms.  Reviewed the purpose of the med change was to minimize drug interactions with Parkinson's disease medications and therefore rather than restarting the oxcarbazepine we would add lorazepam 0.5 to 1 mg nightly or every 8 hours as needed anxiety.  And to let us know if that failed. ? ?07/02/2020 appointment with the following noted: ?About every night 6-7 PM get "dreads".  Dreading Bill Guerrero going to bed and him being alone.  To the point of crying and begging Bill Guerrero to stay up with him.  For the last week using it regularly in the afternoon about 2 mg lorazepam.   ?New problems since stopping the oxcarbazepine.   ?PD sx are not better off the Trileptal.  Overall sx seems to get a little worse. ?Satisfied with meds.  No other new concerns.  Rarely uses Ativan except when goes to doctors' offices or with CT scans Dt claustrophobia. ?Pt reports that mood is Anxious and describes anxiety as Moderate. Anxiety symptoms include: Excessive Worry,. Pt reports has interrupted sleep, not much change off Trileptal. Pt reports that appetite is good. Pt reports  that energy is lethargic and no change. Concentration is down slightly. Suicidal thoughts:  denied by patient.  Less dep and anxiety than last visit. ?Melanoma responded really well to immunotherapy cancer treatment.   ?Discussed concerns about the cost of Seroquel Exar and how bad he feels if he runs out of the medication. ?Plan: Apparently no worse with reduction quetiapine to 150 mg ER HS . Continue it. ?Less drowsiness now  ?Call if sleep worsens or mania. ?Restart oxcarbazepine to 1/2 tablet at night for 1 weeks and then 1 each evening..To stop the sense of dread and anxiety in the evening. ? ?08/02/2020 urgent appointment with the following noted: ?Several recent phone calls complaining of increased anxiety unresolved by attempts to adjust medication over the phone. ?Worst panic attacks I've ever had, now  daily.  Start early afternoon over worry he'll be alone at night bc Bill Guerrero in bed and he's awake.  Crying.  Can get a claustrophobic feeling and fear of not being able to move and see his feet. ?Near blackout spells. Out of nowhere.   Only lasts seconds.   ?Unrealistic fear that he'll die despite positive exams that don't support the fear. ?Plan: Apparently worse with reduction quetiapine to 150 mg ER HS bc severe anxiety with fear he'll die.  Nothing better with less medication and in fact worse. ?Increase quetiapine back to ER 200 mg HS. ?Less drowsiness now  ?Call if sleep worsens or mania. ?Increase oxcarbazepine to 1 tablet in AM and  each evening..To stop the sense of dread and anxiety in the evening. ?  ?08/09/2020 phone call:  RTC ? Couple good days and now anxiety and panic hit and shaking and afraid to be alone tonight.  Scared of big panic attacks and doesn't want to be alone. ? 1 week ago increased quetiapine ER 200 HS and oxcarb to 150 mg 1 BID. ? Tired and not enough sleep DT panic.  Afraid of being alone. ?Takes lorazepam prn 2 mg .  Most days taking 2 daily.  Doesn't make him sleepy.  Took it at 4  pm today.   ? Increase oxcarbazepine to 1 in AM  and 2 at 2 PM and then take 2 mg lorazepam about an hour before anxiety worsens about 3 PM. ? Disc Cog techniques. ? Lynder Parents, MD, DFAPA ? ?09/23/2020 appo

## 2021-06-04 NOTE — Progress Notes (Deleted)
Assessment/Plan:   1.  Parkinsons Disease  -Continue pramipexole 0. 2 5 mg at bedtime, as prescribed by [facility].  -Continue carbidopa/levodopa 25/100, 2 6 times per day as prescribed by [facility].  -continue carbidopa/levodopa 50/200 CR at bedtime  -***Patient not following with [facility] neurology.  They have changed his medications around.  Patient does not need 2 different neurologists, and certainly not 2 different movement disorder neurologist.  Will defer to [facility] 2.  Chronic low back pain             -Has seen neurosurgery and Dr. Letta Pate  -following now with Dr. Rolena Infante and Dr. Nelva Bush with [facility] 3.  Depression             -Following with Dr. Clovis Pu.  Last saw him August 22  -On Seroquel XR, 150 mg daily, which may contribute to Parkinson symptoms 4.  History of melanoma, stage IIIb             -Following with dermatology.  Last seen August 23. 5.  Urinary incontinence, mild  -Discussed with patient that he likely needs to find a urologist.  He is now wearing undergarments.  Subjective:   Bill Guerrero was seen today in follow up for Parkinsonism.  My previous records were reviewed prior to todays visit as well as outside records available to me.  This patient is accompanied in the office by his wife who supplements the history.  Last visit, we started the patient on opicapone.  We gave the patient samples of this medication for months, but in order to get the medication paid for or have any help for assistance programs, we agreed to get to denial letters from the patient's insurance company.  The patient needed to bring those to our office.  Multiple phone calls were left by our office to the patient from several office members, but patient never returned any of those phone calls.  Records reviewed since our last visit.  Patient has actually been to [facility] since our last visit for a second opinion.  He went there on March 6.  His levodopa was increased to 2 tablets 6 times per  day and his pramipexole was decreased to 0.25 mg at bedtime.  He was to follow-up in 2 months.  Current prescribed movement disorder medications: Pramipexole 0.25 mg at bedtime (decreased by [facility] neurology from 0.5 mg 3 times per day) Carbidopa/levodopa 25/100, 2 tablets 6 times per day (increased from 2 tablets 4 times per day by [facility] neurology) Carbidopa/levodopa 50/200 CR at bedtime    PREVIOUS MEDICATIONS: Amantadine (patient states that hallucinations and falls but took twice daily dosing for a long time without this and still falls after it); entacapone (didn't seem to help); opicapone (we tried to get him on an assistance program, but patient needed to give [place] the denial letters that his insurance company denied it in order to get on an assistance program, and he did not follow through)  ALLERGIES:  No Known Allergies  CURRENT MEDICATIONS:  Outpatient Encounter Medications as of 06/05/2021  Medication Sig   AMBULATORY NON FORMULARY MEDICATION Upright walker Dx: [name]   Ascorbic Acid (VITAMIN C) 1000 MG tablet Take 1,000 mg by mouth 2 (two) times daily.   B Complex-C (B-COMPLEX WITH VITAMIN C) tablet Take 1 tablet by mouth daily.   carbidopa-levodopa (SINEMET CR) 50-200 MG tablet TAKE ONE TABLET BY MOUTH EVERY NIGHT AT BEDTIME   carbidopa-levodopa (SINEMET IR) 25-100 MG tablet TAKE  2 TABLETS BY MOUTH AT 8AM, 2 TABLETS AT NOON, 2 TABLETS AT 4PM, AND 2 TABLETS AT 8PM   Cholecalciferol (VITAMIN D-3) 5000 UNITS TABS Take 1 tablet by mouth daily.   cloNIDine (CATAPRES) 0.1 MG tablet TAKE ONE TABLET BY MOUTH TWICE A DAY   lamoTRIgine (LAMICTAL) 100 MG tablet 1/2 tablet daily for 1 week then 1 tablet daily for 1 week then 1 and 1/2 tablet daily for 1 week then 1 tablet twice daily (Patient taking differently: Take 100 mg by mouth 2 (two) times daily.)   lithium carbonate (LITHOBID) 300 MG CR tablet Take 1 tablet (300 mg total) by mouth at bedtime.   LORazepam (ATIVAN) 2 MG tablet Take 0.5 tablets  (1 mg total) by mouth every 6 (six) hours as needed for anxiety.   losartan (COZAAR) 50 MG tablet Take 50 mg by mouth daily. Pt. Is unsure of Mg.   magnesium oxide (MAG-OX) 400 MG tablet Take 400 mg by mouth daily.   niacin 500 MG tablet Take 500 mg by mouth daily with breakfast.   Omega-3 Fatty Acids (FISH OIL BURP-LESS PO) Take 1 capsule by mouth in the morning and at bedtime.   Opicapone (ONGENTYS) 50 MG CAPS Take 50 mg by mouth daily. Samples of this drug were given to the patient, quantity 4, Lot Number [id number]/30/2025   Opicapone 50 MG CAPS Take 50 mg by mouth at bedtime. Samples of this drug were given to the patient, quantity 2, Lot Number [id number] 10/02/2023 Samples of this drug were given to the patient, quantity 1, Lot Number [id number] 08/13/2023   Opicapone 50 MG CAPS Take 50 mg by mouth daily.   OXcarbazepine (TRILEPTAL) 150 MG tablet Take 1 tablet (150 mg total) by mouth 2 (two) times daily.   Potassium 99 MG TABS Take 1 tablet by mouth daily.   pramipexole (MIRAPEX) 0.5 MG tablet TAKE ONE TABLET BY MOUTH THREE TIMES A DAY   pravastatin (PRAVACHOL) 80 MG tablet Take 80 mg by mouth daily before breakfast.   QUEtiapine (SEROQUEL XR) 200 MG 24 hr tablet Take 1 tablet (200 mg total) by mouth at bedtime.   No facility-administered encounter medications on file as of 06/05/2021.    Objective:   PHYSICAL EXAMINATION:    VITALS:   There were no vitals filed for this visit.    GEN:  The patient appears stated age and is in NAD. HEENT:  Normocephalic, atraumatic.  The mucous membranes are moist. The superficial temporal arteries are without ropiness or tenderness. CV:  RRR Lungs:  CTAB.      Neurological examination:  Orientation: The patient is alert and oriented x3. Cranial nerves: There is good facial symmetry with facial hypomimia. The speech is fluent and clear. Soft palate rises symmetrically and there is no tongue deviation. Hearing is intact to conversational  tone. Sensation: Sensation is intact to light touch throughout Motor: Strength is at least antigravity x4.  Movement examination: Tone: There is normal tone today Abnormal movements: rare tremor today in the RUE.  No dyskinesia Coordination:  There is decremation with RAM's, with any form of RAMS, including alternating supination and pronation of the forearm, hand opening and closing, finger taps, heel taps and toe taps bilaterally Gait and Station: Patient pushes off of the chair to arise.  He ambulates well with the upright walker  I have reviewed and interpreted the following labs independently    Chemistry      Component Value Date/Time  NA 141 03/30/2016 1219   K 3.7 03/30/2016 1219   CL 107 03/30/2016 1219   CO2 25 03/30/2016 1219   BUN 17 03/30/2016 1219   CREATININE 0.80 03/30/2016 1219      Component Value Date/Time   CALCIUM 9.4 03/30/2016 1219   ALKPHOS 65 03/30/2016 1219   AST 21 03/30/2016 1219   ALT 27 03/30/2016 1219   BILITOT 0.6 03/30/2016 1219       Lab Results  Component Value Date   WBC 5.6 11/26/2014   HGB 12.8 (L) 11/26/2014   HCT 38.8 (L) 11/26/2014   MCV 96.2 11/26/2014   PLT 195.0 11/26/2014    Lab Results  Component Value Date   TSH 2.11 03/30/2016     Total time spent on today's visit was *** minutes, including both face-to-face time and nonface-to-face time.  Time included that spent on review of records (prior notes available to me/labs/imaging if pertinent), discussing treatment and goals, answering patient's questions and coordinating care.  Cc:  Burnard Bunting, MD

## 2021-06-05 ENCOUNTER — Ambulatory Visit: Payer: Medicare Other | Admitting: Neurology

## 2021-06-05 DIAGNOSIS — Z029 Encounter for administrative examinations, unspecified: Secondary | ICD-10-CM

## 2021-06-06 ENCOUNTER — Encounter: Payer: Self-pay | Admitting: Neurology

## 2021-06-12 ENCOUNTER — Ambulatory Visit (INDEPENDENT_AMBULATORY_CARE_PROVIDER_SITE_OTHER): Payer: Medicare Other | Admitting: Psychiatry

## 2021-06-12 ENCOUNTER — Encounter: Payer: Self-pay | Admitting: Psychiatry

## 2021-06-12 VITALS — BP 91/52 | HR 71

## 2021-06-12 DIAGNOSIS — F4001 Agoraphobia with panic disorder: Secondary | ICD-10-CM | POA: Diagnosis not present

## 2021-06-12 DIAGNOSIS — F40240 Claustrophobia: Secondary | ICD-10-CM

## 2021-06-12 DIAGNOSIS — F411 Generalized anxiety disorder: Secondary | ICD-10-CM

## 2021-06-12 DIAGNOSIS — F5105 Insomnia due to other mental disorder: Secondary | ICD-10-CM

## 2021-06-12 DIAGNOSIS — F3130 Bipolar disorder, current episode depressed, mild or moderate severity, unspecified: Secondary | ICD-10-CM

## 2021-06-12 DIAGNOSIS — G3184 Mild cognitive impairment, so stated: Secondary | ICD-10-CM

## 2021-06-12 MED ORDER — SERTRALINE HCL 50 MG PO TABS: ORAL_TABLET | ORAL | 1 refills | Status: DC

## 2021-06-12 NOTE — Progress Notes (Signed)
Bill Guerrero ?[id number] ?16-Feb-1950 ?71 y.o. ? ? ? ?Subjective:  ? ?Patient ID:  Bill Guerrero is a 71 y.o. (DOB 01/29/1951) male. ? ?Chief Complaint:  ?Chief Complaint  ?Patient presents with  ? Follow-up  ? Anxiety  ? Depression  ? Fatigue  ? ? ?HPI ?Bill Guerrero presents to the office today for follow-up of mood disorder, anxiety, and chronic insomnia. ? ?He is chronically stressed and down due to the combination of dealing with Parkinson's disease and history of cancer. ? ?seen in march 2021.  No meds were changed. ?The following was noted: ?Started playing banjo and taking lessons.  Practices daily.   ?Stress many things he can no longer do physically.  Can't do housework like before.  Getting him down.  Pushing himself to walk with a walker.  Pain in walking without walker.   Has done PT and finished. ?Accidentally dropped dose of Trileptal for ? Time frame without a problem to 150 mg daily.  No mood swings noticed.   ?"Made it longer than they thought I would".  Still in remission with melanoma with follow up tomorrow. ?  But hard to get around.  ?Still has bad days emotionally up to a week at a time.  Anxiety is worse than depression.  Easily overwhelmed.   ?Pattern of EMA at 3 am and then falls asleep watching TV.  Become a habit. Then naps.   ?Occ forgets Seroquel. ? ?09/18/19 appt with the following noted: ?Bad day with weakness and PD.  Fell yesterday down 3-4 steps. It's  Getting worse. ?Not getting better on the banjo.   Started Ukelele.  Interest in music daily.   ?It's so much fun. ?Hallucinated on amantadine and fell and stopped. ?Little Ativan. ?No current treatment for cancer. ?Questions about timing of quetiapine and gets dizzy and weak ?Plan: Consider reducing quetiapine ER to minimize SE but risk increase mood problems. ?Currently seems to be doing ok with depression and mood swings.  ?Yes bc he gets up at night, ?REDUCE quetiapine to ER 200 mg nightly. ? ?01/18/20 appt with  following noted: ?Asks for jury excuse due to Parkinson's Dz with cognitive impairment, alertness and poor memory. ?Tries to walk every other day.  Latley more back pain has gotten him really down.  Then can think what's the purpose of life.  His family is not supportive.  Wife and son not very helpful.  Had bad feelings about it.  Frustrated with family.  Down for a couple of weeks.  PD progressed last 6 mos and harder at times to move.   ?Was falling a lot 6 mos ago, but not in the last 6 mos. ?No difference with reduction in Seroquel and still feels real hung over in the day. ?Plan: Apparently no worse with reduction quetiapine to ER 200 mg nightly,  DT drowsiness reduce again to 150 mg ER HS ? ?05/01/2020 appointment with the following noted: ? More anxiety, but no worsening manic sx.  Sleep MN to 3 AM and then after a few days sleep more.  Same pattern as 6 mos or longer.  No one else notices any changes with reduction.  He notices no changes with PD.  The off times with PD have been more abrupt.  Takes a long time to do things.  Family just thinks he's slow. ?No changes with mood. ?10 years ago slept 8 hour night.  Not that sleepy during the day lately. ?Rare lorazepam. ?Plan: Reduce oxcarbazepine to  1/2 tablet at night for 2 weeks and if sleep is unchanged then stop it. ?The goal is to minimize meds that might interfere with Parkinson's medications or increased risk of falling. ? ?06/18/2020 phone call from patient's wife stating mist he was having more anxiety and panic attacks at night and wondering if it was related to med changes. ?MD response was that it was possible that oxcarbazepine discontinuance could lead to more anxiety or even mania.  He should let us know if he was having any manic symptoms.  Reviewed the purpose of the med change was to minimize drug interactions with Parkinson's disease medications and therefore rather than restarting the oxcarbazepine we would add lorazepam 0.5 to 1 mg nightly  or every 8 hours as needed anxiety.  And to let us know if that failed. ? ?07/02/2020 appointment with the following noted: ?About every night 6-7 PM get "dreads".  Dreading wife going to bed and him being alone.  To the point of crying and begging wife to stay up with him.  For the last week using it regularly in the afternoon about 2 mg lorazepam.   ?New problems since stopping the oxcarbazepine.   ?PD sx are not better off the Trileptal.  Overall sx seems to get a little worse. ?Satisfied with meds.  No other new concerns.  Rarely uses Ativan except when goes to doctors' offices or with CT scans Dt claustrophobia. ?Pt reports that mood is Anxious and describes anxiety as Moderate. Anxiety symptoms include: Excessive Worry,. Pt reports has interrupted sleep, not much change off Trileptal. Pt reports that appetite is good. Pt reports that energy is lethargic and no change. Concentration is down slightly. Suicidal thoughts:  denied by patient.  Less dep and anxiety than last visit. ?Melanoma responded really well to immunotherapy cancer treatment.   ?Discussed concerns about the cost of Seroquel Exar and how bad he feels if he runs out of the medication. ?Plan: Apparently no worse with reduction quetiapine to 150 mg ER HS . Continue it. ?Less drowsiness now  ?Call if sleep worsens or mania. ?Restart oxcarbazepine to 1/2 tablet at night for 1 weeks and then 1 each evening..To stop the sense of dread and anxiety in the evening. ? ?08/02/2020 urgent appointment with the following noted: ?Several recent phone calls complaining of increased anxiety unresolved by attempts to adjust medication over the phone. ?Worst panic attacks I've ever had, now daily.  Start early afternoon over worry he'll be alone at night bc wife in bed and he's awake.  Crying.  Can get a claustrophobic feeling and fear of not being able to move and see his feet. ?Near blackout spells. Out of nowhere.   Only lasts seconds.   ?Unrealistic fear that  he'll die despite positive exams that don't support the fear. ?Plan: Apparently worse with reduction quetiapine to 150 mg ER HS bc severe anxiety with fear he'll die.  Nothing better with less medication and in fact worse. ?Increase quetiapine back to ER 200 mg HS. ?Less drowsiness now  ?Call if sleep worsens or mania. ?Increase oxcarbazepine to 1 tablet in AM and  each evening..To stop the sense of dread and anxiety in the evening. ?  ?08/09/2020 phone call:  RTC ? Couple good days and now anxiety and panic hit and shaking and afraid to be alone tonight.  Scared of big panic attacks and doesn't want to be alone. ? 1 week ago increased quetiapine ER 200 HS and oxcarb to 150 mg  1 BID. ? Tired and not enough sleep DT panic.  Afraid of being alone. ?Takes lorazepam prn 2 mg .  Most days taking 2 daily.  Doesn't make him sleepy.  Took it at 4 pm today.   ? Increase oxcarbazepine to 1 in AM  and 2 at 2 PM and then take 2 mg lorazepam about an hour before anxiety worsens about 3 PM. ? Disc Cog techniques. ? Lynder Parents, MD, DFAPA ? ?09/23/2020 appointment with the following noted: ?Anxiety worse if anything and especially night.  No sleep since Thursday.  Sleeps in chair bc back pain.  Sleeps downstairs away from wife who goes to bed at 8 pm.  Alone a lot and doesn't like it.   ?Taking 2 mg lorazepam daily for the last couple of weeks. Takes 20-30 min to kick in. ?Still some panic but not nightly and serious about once per week. ?Trouble sleeping bc is anxious and it can hit wiithot reason or build over time.  Usually comes on fast. ?[facility] started taking him out weekly and that helps. ?Trying to walk but it's gotten harder.   ?Rare napping. ?Jittery feeling in the morning. ?Plan: Apparently worse with reduction quetiapine to 150 mg ER HS bc severe anxiety with fear he'll die.  Nothing better with less medication and in fact worse. ?Continue quetiapine back to ER 200 mg HS vs switch to IR.  Extensive discussion about  differences. ?Yes for better sleep switch to IR.    FALL precautions. ?Take lorazepam 2 mg every evening at 6 PM. ?Less drowsiness now  ?Call if sleep worsens or mania. ?Continue oxcarbazepine to 1 tablet in A

## 2021-06-12 NOTE — Patient Instructions (Signed)
Reduce the clonidine to 1 tablet at night and start sertraline 1/2 tablet in the AM for 1 week, ?Then stop clonidine and increase sertraline to 1 daily for anxiety ?

## 2021-06-17 ENCOUNTER — Other Ambulatory Visit: Payer: Self-pay | Admitting: Psychiatry

## 2021-06-17 ENCOUNTER — Ambulatory Visit: Payer: Medicare Other | Admitting: Psychiatry

## 2021-06-17 NOTE — Telephone Encounter (Signed)
Pt's son, Raquel Sarna, LVM @ 2pm for refill.  He's not on DPR.  I called back and spoke to pt to confirm medication request.  He would like Lithium Carbonate refill sent to [facility] on [address]. ? ?Next appt 6/13 ?

## 2021-06-21 ENCOUNTER — Other Ambulatory Visit: Payer: Self-pay | Admitting: Psychiatry

## 2021-06-26 ENCOUNTER — Other Ambulatory Visit: Payer: Self-pay | Admitting: Psychiatry

## 2021-06-26 DIAGNOSIS — F40240 Claustrophobia: Secondary | ICD-10-CM

## 2021-06-26 DIAGNOSIS — F4001 Agoraphobia with panic disorder: Secondary | ICD-10-CM

## 2021-06-26 DIAGNOSIS — F3130 Bipolar disorder, current episode depressed, mild or moderate severity, unspecified: Secondary | ICD-10-CM

## 2021-06-26 DIAGNOSIS — F411 Generalized anxiety disorder: Secondary | ICD-10-CM

## 2021-06-26 DIAGNOSIS — F5105 Insomnia due to other mental disorder: Secondary | ICD-10-CM

## 2021-06-30 ENCOUNTER — Other Ambulatory Visit: Payer: Self-pay | Admitting: Psychiatry

## 2021-06-30 DIAGNOSIS — F3130 Bipolar disorder, current episode depressed, mild or moderate severity, unspecified: Secondary | ICD-10-CM

## 2021-07-01 NOTE — Telephone Encounter (Signed)
Pt checking on the status of the  refill  of oxcarbazepine 150 mg.

## 2021-07-01 NOTE — Telephone Encounter (Signed)
Please send if appropriate.

## 2021-07-02 ENCOUNTER — Other Ambulatory Visit: Payer: Self-pay | Admitting: Neurology

## 2021-07-02 DIAGNOSIS — G20A1 Parkinson's disease without dyskinesia, without mention of fluctuations: Secondary | ICD-10-CM

## 2021-07-02 DIAGNOSIS — G20 Parkinson's disease: Secondary | ICD-10-CM

## 2021-07-07 ENCOUNTER — Other Ambulatory Visit: Payer: Self-pay | Admitting: Neurology

## 2021-07-07 DIAGNOSIS — G20 Parkinson's disease: Secondary | ICD-10-CM

## 2021-07-09 ENCOUNTER — Other Ambulatory Visit: Payer: Self-pay | Admitting: Psychiatry

## 2021-07-09 DIAGNOSIS — F3130 Bipolar disorder, current episode depressed, mild or moderate severity, unspecified: Secondary | ICD-10-CM

## 2021-07-14 ENCOUNTER — Other Ambulatory Visit: Payer: Self-pay | Admitting: Psychiatry

## 2021-07-14 DIAGNOSIS — F3130 Bipolar disorder, current episode depressed, mild or moderate severity, unspecified: Secondary | ICD-10-CM

## 2021-07-15 ENCOUNTER — Encounter: Payer: Self-pay | Admitting: Psychiatry

## 2021-07-15 ENCOUNTER — Ambulatory Visit (INDEPENDENT_AMBULATORY_CARE_PROVIDER_SITE_OTHER): Payer: Medicare Other | Admitting: Psychiatry

## 2021-07-15 DIAGNOSIS — F4001 Agoraphobia with panic disorder: Secondary | ICD-10-CM

## 2021-07-15 DIAGNOSIS — F40240 Claustrophobia: Secondary | ICD-10-CM

## 2021-07-15 DIAGNOSIS — F3130 Bipolar disorder, current episode depressed, mild or moderate severity, unspecified: Secondary | ICD-10-CM

## 2021-07-15 DIAGNOSIS — F411 Generalized anxiety disorder: Secondary | ICD-10-CM

## 2021-07-15 DIAGNOSIS — G3184 Mild cognitive impairment, so stated: Secondary | ICD-10-CM

## 2021-07-15 DIAGNOSIS — F5105 Insomnia due to other mental disorder: Secondary | ICD-10-CM | POA: Diagnosis not present

## 2021-07-15 NOTE — Progress Notes (Signed)
Bill Guerrero [id number] 05/25/1950 71 y.o.    Subjective:   Patient ID:  Bill Guerrero is a 71 y.o. (DOB 1950/02/21) male.  Chief Complaint:  Chief Complaint  Patient presents with   Follow-up    Bipolar I disorder, most recent episode depressed ([place])      Depression   Anxiety   Stress    HPI Bill Guerrero presents to the office today for follow-up of mood disorder, anxiety, and chronic insomnia.  He is chronically stressed and down due to the combination of dealing with Parkinson's disease and history of cancer.  seen in march 2021.  No meds were changed. The following was noted: Started playing banjo and taking lessons.  Practices daily.   Stress many things he can no longer do physically.  Can't do housework like before.  Getting him down.  Pushing himself to walk with a walker.  Pain in walking without walker.   Has done PT and finished. Accidentally dropped dose of Trileptal for ? Time frame without a problem to 150 mg daily.  No mood swings noticed.   "Made it longer than they thought I would".  Still in remission with melanoma with follow up tomorrow.   But hard to get around.  Still has bad days emotionally up to a week at a time.  Anxiety is worse than depression.  Easily overwhelmed.   Pattern of EMA at 3 am and then falls asleep watching TV.  Become a habit. Then naps.   Occ forgets Seroquel.  09/18/19 appt with the following noted: Bad day with weakness and PD.  Fell yesterday down 3-4 steps. It's  Getting worse. Not getting better on the banjo.   Started [place].  Interest in music daily.   It's so much fun. Hallucinated on amantadine and fell and stopped. Little Ativan. No current treatment for cancer. Questions about timing of quetiapine and gets dizzy and weak Plan: Consider reducing quetiapine ER to minimize SE but risk increase mood problems. Currently seems to be doing ok with depression and mood swings.  Yes bc he gets up at  night, REDUCE quetiapine to ER 200 mg nightly.  01/18/20 appt with following noted: Asks for jury excuse due to Parkinson's Dz with cognitive impairment, alertness and poor memory. Tries to walk every other day.  Latley more back pain has gotten him really down.  Then can think what's the purpose of life.  His family is not supportive.  Wife and son not very helpful.  Had bad feelings about it.  Frustrated with family.  Down for a couple of weeks.  PD progressed last 6 mos and harder at times to move.   Was falling a lot 6 mos ago, but not in the last 6 mos. No difference with reduction in Seroquel and still feels real hung over in the day. Plan: Apparently no worse with reduction quetiapine to ER 200 mg nightly,  DT drowsiness reduce again to 150 mg ER HS  05/01/2020 appointment with the following noted:  More anxiety, but no worsening manic sx.  Sleep MN to 3 AM and then after a few days sleep more.  Same pattern as 6 mos or longer.  No one else notices any changes with reduction.  He notices no changes with PD.  The off times with PD have been more abrupt.  Takes a long time to do things.  Family just thinks he's slow. No changes with mood. 10 years ago slept 8 hour night.  Not that sleepy during the day lately. Rare lorazepam. Plan: Reduce oxcarbazepine to 1/2 tablet at night for 2 weeks and if sleep is unchanged then stop it. The goal is to minimize meds that might interfere with Parkinson's medications or increased risk of falling.  06/18/2020 phone call from patient's wife stating mist he was having more anxiety and panic attacks at night and wondering if it was related to med changes. MD response was that it was possible that oxcarbazepine discontinuance could lead to more anxiety or even mania.  He should let us know if he was having any manic symptoms.  Reviewed the purpose of the med change was to minimize drug interactions with Parkinson's disease medications and therefore rather than  restarting the oxcarbazepine we would add lorazepam 0.5 to 1 mg nightly or every 8 hours as needed anxiety.  And to let us know if that failed.  07/02/2020 appointment with the following noted: About every night 6-7 PM get "dreads".  Dreading wife going to bed and him being alone.  To the point of crying and begging wife to stay up with him.  For the last week using it regularly in the afternoon about 2 mg lorazepam.   New problems since stopping the oxcarbazepine.   PD sx are not better off the Trileptal.  Overall sx seems to get a little worse. Satisfied with meds.  No other new concerns.  Rarely uses Ativan except when goes to doctors' offices or with CT scans Dt claustrophobia. Pt reports that mood is Anxious and describes anxiety as Moderate. Anxiety symptoms include: Excessive Worry,. Pt reports has interrupted sleep, not much change off Trileptal. Pt reports that appetite is good. Pt reports that energy is lethargic and no change. Concentration is down slightly. Suicidal thoughts:  denied by patient.  Less dep and anxiety than last visit. Melanoma responded really well to immunotherapy cancer treatment.   Discussed concerns about the cost of Seroquel Exar and how bad he feels if he runs out of the medication. Plan: Apparently no worse with reduction quetiapine to 150 mg ER HS . Continue it. Less drowsiness now  Call if sleep worsens or mania. Restart oxcarbazepine to 1/2 tablet at night for 1 weeks and then 1 each evening..To stop the sense of dread and anxiety in the evening.  08/02/2020 urgent appointment with the following noted: Several recent phone calls complaining of increased anxiety unresolved by attempts to adjust medication over the phone. Worst panic attacks I've ever had, now daily.  Start early afternoon over worry he'll be alone at night bc wife in bed and he's awake.  Crying.  Can get a claustrophobic feeling and fear of not being able to move and see his feet. Near blackout  spells. Out of nowhere.   Only lasts seconds.   Unrealistic fear that he'll die despite positive exams that don't support the fear. Plan: Apparently worse with reduction quetiapine to 150 mg ER HS bc severe anxiety with fear he'll die.  Nothing better with less medication and in fact worse. Increase quetiapine back to ER 200 mg HS. Less drowsiness now  Call if sleep worsens or mania. Increase oxcarbazepine to 1 tablet in AM and  each evening..To stop the sense of dread and anxiety in the evening.   08/09/2020 phone call:  RTC  Couple good days and now anxiety and panic hit and shaking and afraid to be alone tonight.  Scared of big panic attacks and doesn't want to be alone.  1 week ago increased quetiapine ER 200 HS and oxcarb to 150 mg 1 BID.  Tired and not enough sleep DT panic.  Afraid of being alone. Takes lorazepam prn 2 mg .  Most days taking 2 daily.  Doesn't make him sleepy.  Took it at 4 pm today.    Increase oxcarbazepine to 1 in AM  and 2 at 2 PM and then take 2 mg lorazepam about an hour before anxiety worsens about 3 PM.  Disc Cog techniques.  Lynder Parents, MD, [facility]  09/23/2020 appointment with the following noted: Anxiety worse if anything and especially night.  No sleep since Thursday.  Sleeps in chair bc back pain.  Sleeps downstairs away from wife who goes to bed at 8 pm.  Alone a lot and doesn't like it.   Taking 2 mg lorazepam daily for the last couple of weeks. Takes 20-30 min to kick in. Still some panic but not nightly and serious about once per week. Trouble sleeping bc is anxious and it can hit wiithot reason or build over time.  Usually comes on fast. [facility] started taking him out weekly and that helps. Trying to walk but it's gotten harder.   Rare napping. Jittery feeling in the morning. Plan: Apparently worse with reduction quetiapine to 150 mg ER HS bc severe anxiety with fear he'll die.  Nothing better with less medication and in fact worse. Continue  quetiapine back to ER 200 mg HS vs switch to IR.  Extensive discussion about differences. Yes for better sleep switch to IR.    FALL precautions. Take lorazepam 2 mg every evening at 6 PM. Less drowsiness now  Call if sleep worsens or mania. Continue oxcarbazepine to 1 tablet in AM and  each evening..To stop the sense of dread and anxiety in the evening.  02/24/2021 phone call: Charlesetta Ivory Pt's wife called reporting Pt is crying and saying he wants to die. Nurse contact with wife: This message was sent as high priority but pt is stable right now. After she gave him 2 of the Lorazepam he did calm down, but remains nervous. Pt request she call. He does have apt Wednesday. Pt's wife reports pt is very anxious and nervous and doesn't like to be left alone in a room or by himself at the house. Very fearful. She reports she stayed home with him today but will be returning to work tomorrow. She reports this has been happening off and on for 2 weeks, pt is sleeping well at night. No other symptoms reported.   I instructed her okay to give lorazepam later this evening or night if needed and sounded like while she is at work he will also need it for being alone. Informed her I would touch base with Dr. Clovis Pu and if he recommended anything prior to apt. She didn't feel there probably was anything.  MD response: Patient is apparently not suicidal just highly distressed.  Agree with lorazepam 2 mg 3 times daily until the appointment in 2 days. May consider clonidine  02/26/2021 appointment with the following noted: Even after Ativan 2 mg anxiety is bad and never been this bad.  Can't stand to be alone, even if wife in the house but sleeping upstairs.  Yesterday beg wife for help.  Afraid he'll die in his sleep. Only Ativan 2 mg TID once.   Kind of drugged feeling for the past few weeks. Taking Seroquel 200 mg HS, lamotrigine 200 mg daily, oxcarbazepine 150 BID, pramipexole 0.5  mg TID Sleep better with switch to  Seroquel IR 200 mg HS Plan: More panic to unmanageable degree.  More dependence behavior bc of anxiety Disc off label use of clonidine for bipolar and anxiety. Push fluids and fall precautions Clonidine 0.1 mg tablet 1/2 at night for 2 nights, then 1/2 tablet twice daily for 4 nights, then 1/2 in the AM and 1 tablet at night for 4 nights then 1 twice daily Continue quetiapine  200 mg HS IR.   FALL precautions. Take lorazepam 2 mg TID until better with clonidine   03/21/2021 TC  complaining of feeling drugged with a combination of clonidine and lorazepam.  He was encouraged to gradually reduce lorazepam 2 mg tablet to 1/2 tablet 3 times daily and 2 mg at bedtime.  04/18/2021 telephone note: Note RTC [phone].  Started getting depressed again about 4 days ago.  No SI but purposelessness.   04-07-21 neuro started reducing pramipexole to switch bc hypersexual thoughts causing problems with his wife.  Right now depression worse than anxiety.  Doesn't want to be left alone.  Trouble keeping up meds.    Can't tell if clonidine helps anxiety but is taking lorazepam only a couple of times.  Accidentally stopped lamotrigine at some point and doesn't know when. Restart lamotrigine and increase to 100 mg BID  Counseled patient regarding potential benefits, risks, and side effects of Lamictal to include potential risk of Stevens-Johnson syndrome. Advised patient to stop taking Lamictal and contact office immediately if rash develops and to seek urgent medical attention if rash is severe and/or spreading quickly. Will start Lamictal 25 mg daily for 2 weeks, then increase to 50 mg daily for 2 weeks, then 100 mg daily for 2 weeks, then 150 mg daily for mood symptoms.   Add low dose lithium to speed recover and for SI 300 mg daily.  He agrees to the plan  Lynder Parents, MD, [facility]       04/29/2021 appointment with the following noted: seen with sister Not good with more depression the last couple of weeks.   Cry daily.  Tells sister he has SI and thoughts scare him.  Doesn't think he would do it.  Feels like he's sick and might die.  Still afraid of sleeping alone downstairs. Both scared to die and sometimes wants to die. Taking lihtium 300 mg HS, clonidine 0.1 mg BID, lamotrigine 100 BID, Trileptal 150 BID, quetiapine 200 mg HS, less lorazepam. Looking at asst living facility and it's hard to imagine it. Got a cat. Still having falls, almost daily mostly afternoon and early evenings. Plan: increase quetiapine  300 mg HS IR.    . 05/23/21 appt noted: also spoke to [facility] and helping. Wife DM and poor self care and died in middle of night at 72 yo. Problems with sleep.  Still panic at night and afraid to be alone like before. When gets sleepy feels drugged and that gets scary to him.  Will call relatives sisters and sons trying to have someone come sit with him.   Stay on edge of panic starting about 6 PM. In process of getting MCD so he can get assisted living.  Cannot take care of himself.d Unable to get up in middle of the night for months. Plan: Switch back to quetiapine ER 200 mg for more gradual onset and improve his ability to get up in the middle of the night.  Try to use LED  06/12/21 appt noted:  seen  with sister, Jesusita Oka and weak 30 min before coming to visit.  Was late with meds and that might have ben the trigger.  Fear of falling and has fallen a number of times.  Usually uses the walker.   40 yo son moving out next week and pt biggest fear is being by himself. Not sleeping much at night bc panicky when son goes to bed.  Chest heaviness.  Trying to get MCD so he can get into assisted living.  Still working on that. Sort of question what I have to live for bc losses.  No sui intent or plan.  Questions his purpose. Not much to enjoy.  Haven't read a book in 5-6 years bc poor concentration. Plan: Wean clonidine. Disc risk SSRI causing mood swings but few options left to  manage anxiety so trial low dose Sertraline 25 mg daily for 1 week, then 50 mg daily.  07/15/2021 appointment with the following noted: with sister Vivien Rota On sertraline 50 mg daily, quetiapine XR 200 mg daily, oxcarbazepine 150 twice daily lithium 300 nightly, lamotrigine 100 mg twice daily Weaned off clonidine. Passed out the other day after getting dizzy. Often feels dizzy.  Several falls lately. Hadn't felt much different with anxiety.  Scary alone at night bc fear of falling.  Gets fear when sun starts going down.   D in law says he's had some paranoia.  She thinks it's worse in the last month.  Neighbor has checked on him that he didn't know.  He had some paranoid thoughts about the neighbor and someone else.   PD followed by Dr. Carles Collet dx about 2016 Vaccinated.  Past Psychiatric Medication Trials: Seroquel XR 300 Trileptal, lamotrigine 100 BID Lithium tremor Klonopin, Ativan Clonidine 0.1 BID Sertraline ? Paranoia.  Review of Systems:  Review of Systems  Constitutional:  Positive for fatigue.  Cardiovascular:  Negative for chest pain and palpitations.  Genitourinary:        Urinary incontinence and using Depends  Musculoskeletal:  Positive for arthralgias, back pain and gait problem.  Neurological:  Positive for dizziness, tremors and weakness. Negative for syncope and speech difficulty.       Balance problems from PD. Falling.  Psychiatric/Behavioral:  Positive for sleep disturbance. Negative for agitation, behavioral problems, confusion, decreased concentration, dysphoric mood, hallucinations, self-injury and suicidal ideas. The patient is nervous/anxious. The patient is not hyperactive.   Frequent falls are a problem.  Can't pick himself up without help.  Medications: I have reviewed the patient's current medications.  Current Outpatient Medications  Medication Sig Dispense Refill   AMBULATORY NON FORMULARY MEDICATION Upright walker Dx: [name] 1 Device 0   Ascorbic Acid  (VITAMIN C) 1000 MG tablet Take 1,000 mg by mouth 2 (two) times daily.     B Complex-C (B-COMPLEX WITH VITAMIN C) tablet Take 1 tablet by mouth daily.     carbidopa-levodopa (SINEMET IR) 25-100 MG tablet TAKE 2 TABLETS BY MOUTH AT 8AM, 2 TABLETS AT NOON, 2 TABLETS AT 4PM, AND 2 TABLETS AT 8PM 340 tablet 0   Cholecalciferol (VITAMIN D-3) 5000 UNITS TABS Take 1 tablet by mouth daily.     lamoTRIgine (LAMICTAL) 100 MG tablet Take 1 tablet (100 mg total) by mouth 2 (two) times daily. 60 tablet 0   lithium carbonate (LITHOBID) 300 MG CR tablet TAKE ONE TABLET BY MOUTH AT BEDTIME 90 tablet 1   LORazepam (ATIVAN) 2 MG tablet Take 0.5 tablets (1 mg total) by mouth every 6 (six) hours as  needed for anxiety. 60 tablet 1   losartan (COZAAR) 50 MG tablet Take 50 mg by mouth daily. Pt. Is unsure of Mg.     magnesium oxide (MAG-OX) 400 MG tablet Take 400 mg by mouth daily.     niacin 500 MG tablet Take 500 mg by mouth daily with breakfast.     Omega-3 Fatty Acids (FISH OIL BURP-LESS PO) Take 1 capsule by mouth in the morning and at bedtime.     Opicapone 50 MG CAPS Take 50 mg by mouth daily. 30 capsule 0   OXcarbazepine (TRILEPTAL) 150 MG tablet TAKE ONE TABLET BY MOUTH TWICE A DAY 180 tablet 0   Potassium 99 MG TABS Take 1 tablet by mouth daily.     pravastatin (PRAVACHOL) 80 MG tablet Take 80 mg by mouth daily before breakfast.     QUEtiapine (SEROQUEL XR) 200 MG 24 hr tablet Take 1 tablet (200 mg total) by mouth at bedtime. 30 tablet 1   sertraline (ZOLOFT) 50 MG tablet Take 1/2 tab po qd x 7 days, then 1 tab po qd (Patient taking differently: 1 tab po qd) 30 tablet 1   carbidopa-levodopa (SINEMET CR) 50-200 MG tablet TAKE ONE TABLET BY MOUTH EVERY NIGHT AT BEDTIME (Patient not taking: Reported on 07/15/2021) 90 tablet 1   pramipexole (MIRAPEX) 0.5 MG tablet TAKE ONE TABLET BY MOUTH THREE TIMES A DAY (Patient not taking: Reported on 07/15/2021) 270 tablet 0   No current facility-administered medications for  this visit.    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 -"pinkish tan""no odor"   Multiple body piercings    Tremor    RT HAND    Family History  Problem Relation Age of Onset   Leukemia Mother    Healthy Sister    Healthy Sister    Healthy Son    Healthy Son    Healthy Daughter     Social History   Socioeconomic History   Marital status: Widowed    Spouse name: Not on file   Number of children: 3   Years of education: Not on file   Highest education level: 12th grade  Occupational History   Not on file  Tobacco Use   Smoking status: Never   Smokeless tobacco: Never  Vaping Use   Vaping Use: Never used  Substance and Sexual Activity   Alcohol use: No    Alcohol/week: 1.0 standard drink of alcohol    Types: 1 Glasses of wine per week    Comment: no alcohol in 2 years   Drug use: No   Sexual activity: Yes  Other Topics Concern   Not on file  Social History Narrative   Right handed   2 Story home    Lives with spouse and son   Social Determinants of Health   Financial Resource Strain: Not on file  Food Insecurity: Not on file  Transportation Needs: Not on file  Physical Activity: Not on file  Stress: Not on file  Social Connections: Not on file  Intimate Partner Violence: Not on file    Past Medical History, Surgical history, Social history, and Family history were reviewed and updated as appropriate.   Please see review  of systems for further details on the patient's review from today.   Objective:   Physical Exam:  There were no vitals taken for this visit.  Physical Exam Neurological:     Mental Status: He is alert and oriented to person, place, and time.     Cranial Nerves: No dysarthria.   Psychiatric:        Attention and Perception: Attention and perception normal. He does not perceive auditory hallucinations.        Mood and Affect: Mood is anxious and depressed. Affect is not tearful.        Speech: Speech normal.        Behavior: Behavior is cooperative.        Thought Content: Thought content normal. Thought content is not paranoid or delusional. Thought content does not include homicidal or suicidal ideation. Thought content does not include suicidal plan.        Cognition and Memory: Cognition and memory normal.        Judgment: Judgment normal.     Comments: Insight fair Talkative without pressure     Lab Review:     Component Value Date/Time   NA 141 03/30/2016 1219   K 3.7 03/30/2016 1219   CL 107 03/30/2016 1219   CO2 25 03/30/2016 1219   GLUCOSE 97 03/30/2016 1219   BUN 17 03/30/2016 1219   CREATININE 0.80 03/30/2016 1219   CALCIUM 9.4 03/30/2016 1219   PROT 6.9 03/30/2016 1219   ALBUMIN 4.7 03/30/2016 1219   AST 21 03/30/2016 1219   ALT 27 03/30/2016 1219   ALKPHOS 65 03/30/2016 1219   BILITOT 0.6 03/30/2016 1219   GFRNONAA 86 (L) 10/06/2012 1130   GFRAA >90 10/06/2012 1130       Component Value Date/Time   WBC 5.6 11/26/2014 1133   RBC 4.04 (L) 11/26/2014 1133   HGB 12.8 (L) 11/26/2014 1133   HCT 38.8 (L) 11/26/2014 1133   PLT 195.0 11/26/2014 1133   MCV 96.2 11/26/2014 1133   MCH 31.9 10/06/2012 1130   MCHC 33.0 11/26/2014 1133   RDW 13.6 11/26/2014 1133   LYMPHSABS 1.1 11/26/2014 1133   MONOABS 0.6 11/26/2014 1133   EOSABS 0.1 11/26/2014 1133   BASOSABS 0.0 11/26/2014 1133    Lithium Lvl  Date Value Ref Range Status  08/17/2010 0.44 (L) 0.80 - 1.40 mEq/L Final     No results found for: "PHENYTOIN", "PHENOBARB", "VALPROATE", "CBMZ"   .res Assessment: Plan:    Bipolar I disorder, most recent episode depressed ([place])  Generalized anxiety disorder  Panic disorder with agoraphobia  Insomnia due to mental  condition  Claustrophobia  Mild cognitive impairment   Anxiety and insomnia have worsened and remained poorly controlled.  Situation is driving a lot of this bc his fear of being alone.    Disc balancing benefit vs SE of Seroquel in view of Parkinson's Dz.   Discussed potential metabolic side effects associated with atypical antipsychotics, as well as potential risk for movement side effects. Advised pt to contact office if movement side effects occur.   anxiety iis still bad and clonidine did not help enough. And it is pushing BP down some.. . Chronic insomnia is worse from anxiety. More panic to unmanageable degree.  More dependence behavior bc of anxiety  Disc risk SSRI causing mood swings but few options left to manage anxiety so trial low dose Sertraline  50 mg daily without benefit so far.  Will stop bc seemed like he's been  more paranoid per sister.  Anxiety so far has been unmanageable. worse with reduction quetiapine to 150 mg ER HS bc severe anxiety with fear he'll die.  Nothing better with less medication and in fact worse.  Disc his fighting the sleepiness effect from Seroquel IR is counter productive.   Once the med kicks in he cannot walk  Continue  quetiapine ER 200 mg for more gradual onset and improve his ability to get up in the middle of the night.  Try to use LED  lorazepam 2 mg  LED prn 1/2 to 1 tablet TID  Less drowsiness now  Call if sleep worsens or mania. Continue oxcarbazepine to 1 tablet in AM and  each evening..To stop the sense of dread and anxiety in the evening.  Needs placement bc falling and can't manage this and anxiety alone.  He's looking into this now.    Supportive therapy and health direction given severe dx PD and malignant melanoma.  And dealing with family's lack of attention.  Enc cont exercise which is helping.  Disc risk of PD meds affecting psych status including  Risk of PD.  We discussed the short-term risks associated with  benzodiazepines including sedation and increased fall risk among others.  Discussed long-term side effect risk including dependence, potential withdrawal symptoms, and the potential eventual dose-related risk of dementia.  But recent studies from 2020 dispute this association between benzodiazepines and dementia risk. Newer studies in 2020 do not support an association with dementia.  He is taking more of this.  Disc costs of Rx and how to manage for ex with [employer] he forgot to look into this and he was reminded about it today.  He was given written information about it.   Patient agrees with this plan.  4 weeks  Lynder Parents, MD, [facility]     Please see After Visit Summary for patient specific instructions.  Future Appointments  Date Time Provider [place]  08/13/2021  4:00 PM [name], [name], MD [facility] None    No orders of the defined types were placed in this encounter.      -------------------------------

## 2021-07-15 NOTE — Patient Instructions (Signed)
Reduce sertraline to 1/2 daily for 1 week, then stop it.

## 2021-07-16 ENCOUNTER — Telehealth: Payer: Self-pay | Admitting: Psychiatry

## 2021-07-16 NOTE — Telephone Encounter (Signed)
Per Dr. Casimiro Needle note from yesterday patient is to be taking 200 mg. Son notified.

## 2021-07-16 NOTE — Telephone Encounter (Signed)
Bill Guerrero's Quetiapine 300 mg is close to running out. He only has a few left. He does have a full bottle of 200 mg that he has left. Can he use the 200 mg to make it 300 so he can use these pills up or can he get a refill on the 300 mg? Please call his son Bill Guerrero at [phone]. He is on the [facility]. Pharmacy is:   [facility] [id number] - [place], [address]  Phone:  [phone]  Fax:  [phone]

## 2021-07-18 ENCOUNTER — Other Ambulatory Visit: Payer: Self-pay | Admitting: Psychiatry

## 2021-07-18 DIAGNOSIS — F3130 Bipolar disorder, current episode depressed, mild or moderate severity, unspecified: Secondary | ICD-10-CM

## 2021-07-23 ENCOUNTER — Other Ambulatory Visit: Payer: Self-pay | Admitting: Psychiatry

## 2021-08-03 ENCOUNTER — Other Ambulatory Visit: Payer: Self-pay | Admitting: Psychiatry

## 2021-08-03 DIAGNOSIS — F4001 Agoraphobia with panic disorder: Secondary | ICD-10-CM

## 2021-08-09 ENCOUNTER — Other Ambulatory Visit: Payer: Self-pay | Admitting: Psychiatry

## 2021-08-09 DIAGNOSIS — F411 Generalized anxiety disorder: Secondary | ICD-10-CM

## 2021-08-09 DIAGNOSIS — F4001 Agoraphobia with panic disorder: Secondary | ICD-10-CM

## 2021-08-13 ENCOUNTER — Ambulatory Visit: Payer: Medicare Other | Admitting: Psychiatry

## 2021-08-19 ENCOUNTER — Other Ambulatory Visit: Payer: Self-pay | Admitting: Psychiatry

## 2021-09-11 ENCOUNTER — Telehealth: Payer: Self-pay | Admitting: Neurology

## 2021-09-11 NOTE — Telephone Encounter (Signed)
Patient's sister Vivien Rota called and said the patient is needing an appointment with Dr. Carles Collet after having a possible fall at his assisted living facility.   Okay to schedule?

## 2021-09-11 NOTE — Telephone Encounter (Signed)
Called Sister back and spoke to her on the wishes of the patients wife for him to go to [facility] . [facility] has changed his medications and continue to give him refills. He unfortunately is not doing well and Dr. Carles Collet said we can get him scheduled but not unit; 6-8 months out because he would have to reestablish.

## 2021-09-11 NOTE — Telephone Encounter (Signed)
Patient's sister advised patient's wife, now deceased, is the one who initiated the change to [facility]. However, patient is wanting to stay with Dr. Carles Collet.

## 2021-09-11 NOTE — Telephone Encounter (Signed)
Spoke with chelsea about this pt.  Last seen here 12/2020.  Seen at [facility] 04/2021, where meds changed. We can see him back here but f/u appts booked pretty far out and looks like [facility] refilling meds.  Sister going to chat with pt.  Didn't make a f/u here

## 2021-09-17 ENCOUNTER — Other Ambulatory Visit: Payer: Self-pay | Admitting: Psychiatry

## 2021-09-17 DIAGNOSIS — F3130 Bipolar disorder, current episode depressed, mild or moderate severity, unspecified: Secondary | ICD-10-CM

## 2021-09-26 ENCOUNTER — Other Ambulatory Visit: Payer: Self-pay | Admitting: Psychiatry

## 2021-09-26 DIAGNOSIS — F3130 Bipolar disorder, current episode depressed, mild or moderate severity, unspecified: Secondary | ICD-10-CM

## 2021-09-30 ENCOUNTER — Emergency Department (HOSPITAL_COMMUNITY): Payer: Medicare Other

## 2021-09-30 ENCOUNTER — Emergency Department (HOSPITAL_COMMUNITY)
Admission: EM | Admit: 2021-09-30 | Discharge: 2021-10-01 | Disposition: A | Discharge status: Skilled Nursing Facility | Payer: Medicare Other | Attending: Emergency Medicine | Admitting: Emergency Medicine

## 2021-09-30 ENCOUNTER — Encounter (HOSPITAL_COMMUNITY): Payer: Self-pay

## 2021-09-30 ENCOUNTER — Ambulatory Visit: Payer: Medicare Other | Admitting: Psychiatry

## 2021-09-30 ENCOUNTER — Other Ambulatory Visit: Payer: Self-pay

## 2021-09-30 DIAGNOSIS — E860 Dehydration: Secondary | ICD-10-CM | POA: Insufficient documentation

## 2021-09-30 DIAGNOSIS — Z79899 Other long term (current) drug therapy: Secondary | ICD-10-CM | POA: Diagnosis not present

## 2021-09-30 DIAGNOSIS — R531 Weakness: Secondary | ICD-10-CM | POA: Insufficient documentation

## 2021-09-30 LAB — BASIC METABOLIC PANEL
Anion gap: 7 (ref 5–15)
BUN: 23 mg/dL (ref 8–23)
CO2: 23 mmol/L (ref 22–32)
Calcium: 9.2 mg/dL (ref 8.9–10.3)
Chloride: 109 mmol/L (ref 98–111)
Creatinine, Ser: 0.88 mg/dL (ref 0.61–1.24)
GFR, Estimated: >60 mL/min (ref >60)
Glucose, Bld: 116 mg/dL — ABNORMAL HIGH (ref 70–99)
Potassium: 3.7 mmol/L (ref 3.5–5.1)
Sodium: 139 mmol/L (ref 135–145)

## 2021-09-30 LAB — URINALYSIS, ROUTINE W REFLEX MICROSCOPIC
Bilirubin Urine: NEGATIVE
Glucose, UA: NEGATIVE mg/dL
Hgb urine dipstick: NEGATIVE
Ketones, ur: 5 mg/dL — AB
Leukocytes,Ua: NEGATIVE
Nitrite: NEGATIVE
Protein, ur: NEGATIVE mg/dL
Specific Gravity, Urine: 1.015 (ref 1.005–1.030)
pH: 6 (ref 5.0–8.0)

## 2021-09-30 LAB — CBG MONITORING, ED: Glucose-Capillary: 107 mg/dL — ABNORMAL HIGH (ref 70–99)

## 2021-09-30 LAB — CBC
HCT: 32.9 % — ABNORMAL LOW (ref 39.0–52.0)
Hemoglobin: 11 g/dL — ABNORMAL LOW (ref 13.0–17.0)
MCH: 33.2 pg (ref 26.0–34.0)
MCHC: 33.4 g/dL (ref 30.0–36.0)
MCV: 99.4 fL (ref 80.0–100.0)
Platelets: 179 10*3/uL (ref 150–400)
RBC: 3.31 MIL/uL — ABNORMAL LOW (ref 4.22–5.81)
RDW: 12.8 % (ref 11.5–15.5)
WBC: 6 10*3/uL (ref 4.0–10.5)
nRBC: 0 % (ref 0.0–0.2)

## 2021-09-30 LAB — LITHIUM LEVEL: Lithium Lvl: 0.22 mmol/L — ABNORMAL LOW (ref 0.60–1.20)

## 2021-09-30 MED ORDER — SODIUM CHLORIDE 0.9 % IV BOLUS
1000.0000 mL | Freq: Once | INTRAVENOUS | Status: AC
  Administered 2021-09-30: 1000 mL via INTRAVENOUS

## 2021-09-30 NOTE — Progress Notes (Signed)
No show.  [facility] poor health

## 2021-09-30 NOTE — ED Provider Notes (Signed)
[facility] DEPT Provider Note   CSN: [id number] Arrival date & time: 09/30/21  1718     History  Chief Complaint  Patient presents with   Weakness    Bill Guerrero is a 71 y.o. male.  HPI Patient with Parkinson's disease, hypertension, presents via EMS from home due to concern for worsening weakness.  He notes 3 weeks of progressive symptoms.  During this time he has seen his physician assistant, and had his blood pressure medication cut in half.  He has not seen his neurologist during this illness.  No focal weakness, no fever, no speech changes, no fall.  Symptoms are worse with upright positioning.  And EMS reports orthostatic hypotension.    Home Medications Prior to Admission medications   Medication Sig Start Date End Date Taking? Authorizing Provider  AMBULATORY NON FORMULARY MEDICATION Upright walker Dx: [name] 10/27/19   [name], [name], DO  Ascorbic Acid (VITAMIN C) 1000 MG tablet Take 1,000 mg by mouth 2 (two) times daily.    [provider]  B Complex-C (B-COMPLEX WITH VITAMIN C) tablet Take 1 tablet by mouth daily.    [provider]  carbidopa-levodopa (SINEMET CR) 50-200 MG tablet TAKE ONE TABLET BY MOUTH EVERY NIGHT AT BEDTIME Patient not taking: Reported on 07/15/2021 12/12/20   [name], [name], DO  carbidopa-levodopa (SINEMET IR) 25-100 MG tablet TAKE 2 TABLETS BY MOUTH AT 8AM, 2 TABLETS AT NOON, 2 TABLETS AT 4PM, AND 2 TABLETS AT 8PM 04/28/21   [name], [name], DO  Cholecalciferol (VITAMIN D-3) 5000 UNITS TABS Take 1 tablet by mouth daily.    [provider]  lamoTRIgine (LAMICTAL) 100 MG tablet TAKE 1 TABLET BY MOUTH TWICE A DAY 08/19/21   [name], [name], MD  lithium carbonate (LITHOBID) 300 MG CR tablet TAKE ONE TABLET BY MOUTH AT BEDTIME 06/17/21   [name], [name], MD  LORazepam (ATIVAN) 2 MG tablet TAKE 1/2 TABLET BY MOUTH EVERY 6 HOURS AS NEEDED FOR ANXIETY 08/04/21   [name], [name], MD   losartan (COZAAR) 50 MG tablet Take 50 mg by mouth daily. Pt. Is unsure of Mg.    [provider]  magnesium oxide (MAG-OX) 400 MG tablet Take 400 mg by mouth daily.    [provider]  niacin 500 MG tablet Take 500 mg by mouth daily with breakfast.    [provider]  Omega-3 Fatty Acids (FISH OIL BURP-LESS PO) Take 1 capsule by mouth in the morning and at bedtime.    [provider]  Opicapone 50 MG CAPS Take 50 mg by mouth daily. 12/03/20   [name], [name], DO  OXcarbazepine (TRILEPTAL) 150 MG tablet TAKE 1 TABLET BY MOUTH TWICE A DAY 09/26/21   [name], [name], MD  Potassium 99 MG TABS Take 1 tablet by mouth daily.    [provider]  pramipexole (MIRAPEX) 0.5 MG tablet TAKE ONE TABLET BY MOUTH THREE TIMES A DAY Patient not taking: Reported on 07/15/2021 04/03/21   [name], [name], DO  pravastatin (PRAVACHOL) 80 MG tablet Take 80 mg by mouth daily before breakfast.    [provider]  QUEtiapine (SEROQUEL XR) 200 MG 24 hr tablet TAKE 1 TABLET BY MOUTH AT BEDTIME 09/17/21   [name], [name], MD  sertraline (ZOLOFT) 50 MG tablet Take 1/2 tab po qd x 7 days, then 1 tab po qd Patient taking differently: 1 tab po qd 06/12/21  [name], [name], MD      Allergies    Patient has no known allergies.    Review of Systems   Review of Systems  All other systems reviewed and are negative.   Physical Exam Updated Vital Signs BP (!) 153/93   Pulse 74   Temp 98.1 F (36.7 C) (Oral)   Resp 19   SpO2 100%  Physical Exam Vitals and nursing note reviewed.  Constitutional:      General: He is not in acute distress.    Appearance: He is well-developed.  HENT:     Head: Normocephalic and atraumatic.  Eyes:     Conjunctiva/sclera: Conjunctivae normal.  Cardiovascular:     Rate and Rhythm: Normal rate and regular rhythm.  Pulmonary:     Effort: Pulmonary effort is normal. No respiratory distress.     Breath sounds: No stridor.   Abdominal:     General: There is no distension.  Skin:    General: Skin is warm and dry.  Neurological:     Mental Status: He is alert and oriented to person, place, and time.     ED Results / Procedures / Treatments   Labs (all labs ordered are listed, but only abnormal results are displayed) Labs Reviewed  BASIC METABOLIC PANEL - Abnormal; Notable for the following components:      Result Value   Glucose, Bld 116 (*)    All other components within normal limits  CBC - Abnormal; Notable for the following components:   RBC 3.31 (*)    Hemoglobin 11.0 (*)    HCT 32.9 (*)    All other components within normal limits  URINALYSIS, ROUTINE W REFLEX MICROSCOPIC - Abnormal; Notable for the following components:   Ketones, ur 5 (*)    All other components within normal limits  LITHIUM LEVEL - Abnormal; Notable for the following components:   Lithium Lvl 0.22 (*)    All other components within normal limits  CBG MONITORING, ED - Abnormal; Notable for the following components:   Glucose-Capillary 107 (*)    All other components within normal limits    EKG EKG Interpretation  Date/Time:  Tuesday September 30 2021 17:35:36 EDT Ventricular Rate:  59 PR Interval:    QRS Duration: 145 QT Interval:  446 QTC Calculation: 442 R Axis:   -20 Text Interpretation: Sinus rhythm Left ventricular hypertrophy Lateral infarct, age indeterminate Confirmed by Carmin Muskrat [phone]) on 09/30/2021 8:32:24 PM  Radiology DG Chest 2 View  Result Date: 09/30/2021 CLINICAL DATA:  Weakness EXAM: CHEST - 2 VIEW COMPARISON:  Chest x-ray 05/23/2012 FINDINGS: The heart size and mediastinal contours are within normal limits. Both lungs are clear. Cervical spinal fusion hardware is present. There are postsurgical changes in the right shoulder. IMPRESSION: No active cardiopulmonary disease. Electronically Signed   By: Ronney Asters M.D.   On: 09/30/2021 18:20    Procedures Procedures    Medications Ordered  in ED Medications  sodium chloride 0.9 % bolus 1,000 mL (0 mLs Intravenous Stopped 09/30/21 2224)    ED Course/ Medical Decision Making/ A&P This patient with a Hx of Parkinson's disease, hypertension, depression presents to the ED for concern of weakness, this involves an extensive number of treatment options, and is a complaint that carries with it a high risk of complications and morbidity.    The differential diagnosis includes of Parkinson disease, medication effects, illness, dehydration   Social Determinants of Health:  Parkinson disease  After the  initial evaluation, orders, including: Labs orthostatic hypotension, IV fluids were initiated.   Patient placed on Cardiac and Pulse-Oximetry Monitors. The patient was maintained on a cardiac monitor.  The cardiac monitored showed an rhythm of 70 sinus normal The patient was also maintained on pulse oximetry. The readings were typically 100% room air normal   On repeat evaluation of the patient stayed the same  Lab Tests:  I personally interpreted labs.  The pertinent results include: Hypertherapeutic lithium value, mild ketonuria patient is mildly anemic as well, they will for a face line  Imaging Studies ordered:  I independently visualized and interpreted imaging which showed unremarkable chest x-ray I agree with the radiologist interpretation   10:31 PMDispostion / Final MDM: Patient awake, alert ambulatory, with assistance of a walker which is his baseline.  He and I had a lengthy conversation about all findings, and on repeat exam he sitting upright on the edge of the bed in no distress, speaking clearly.  Reviewed his orthostatic vital signs, and with mild ketone urea there are some suspicion for mild dehydration, otherwise labs physical exam story are generally reassuring, little evidence for bacteremia, sepsis, infection, some consideration of medication effects versus progression of his Parkinson's disease contributing  to his symptoms.  Patient is also mildly subtherapeutic lithium level, can discuss this with his physician for appropriate dosing which was recently reduced, apparently.    Final Clinical Impression(s) / ED Diagnoses Final diagnoses:  Weakness  Dehydration     Carmin Muskrat, MD 09/30/21 2231
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
## 2021-09-30 NOTE — ED Notes (Signed)
Patient was given something to eat and drink  

## 2021-09-30 NOTE — Discharge Instructions (Signed)
As discussed, today's evaluation has been generally reassuring.  There is some evidence for mild dehydration, but bigger picture, it is important that you meet with your physician assistant in your neurologist to discuss your medication regimen in general.  Please stay well-hydrated, monitor your condition carefully and do not hesitate to return here for concerning changes in your condition.

## 2021-09-30 NOTE — ED Notes (Signed)
Patient is unable to walk at this time due to parkinsons. Patient lives at a Retirement home and is unable to ambulate into the home independently.

## 2021-09-30 NOTE — ED Triage Notes (Addendum)
Pt BIB EMS from home. Pt endorses progressively worsening weakness x2 weeks. Pt was orthostatic with EMS. Hx of Parkinsons. A&O x4.   20G LAC 54m NS

## 2021-10-02 ENCOUNTER — Telehealth: Payer: Self-pay
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
## 2021-10-23 ENCOUNTER — Telehealth: Payer: Self-pay | Admitting: Psychiatry

## 2021-10-23 ENCOUNTER — Other Ambulatory Visit: Payer: Self-pay

## 2021-10-23 ENCOUNTER — Other Ambulatory Visit: Payer: Self-pay | Admitting: Psychiatry

## 2021-10-23 MED ORDER — LAMOTRIGINE 100 MG PO TABS: 100.0000 mg | ORAL_TABLET | Freq: Two times a day (BID) | ORAL | 1 refills | Status: DC

## 2021-10-23 NOTE — Telephone Encounter (Signed)
Pt needs refill of Lamotrigine sent to [facility].  Also pls change his default pharmacy to this location instead of [facility].  Next appt 11/14

## 2021-10-23 NOTE — Telephone Encounter (Signed)
Rx sent 

## 2021-10-27 ENCOUNTER — Telehealth: Payer: Self-pay | Admitting: Psychiatry

## 2021-10-27 NOTE — Telephone Encounter (Signed)
Nicholes Rough PA-C at [facility] called about mutual patient. Asking you to contact her @ [phone]

## 2021-10-27 NOTE — Telephone Encounter (Signed)
Bill Rough PA-C at [facility] called about mutual patient. Asking you to contact her @ [phone]  RTC: To Bill Junker, PA-C: Patient is new to her.  He has complained of nausea and weakness.  She wondered if psych meds could be related.  We reviewed the history of psych meds and his somewhat treatment resistant status with regard to bipolar depression and severe anxiety.  He is on low doses of Seroquel, very low dose of oxcarbazepine which has been stable and lamotrigine.  Recently started sertraline 50 mg daily to try to help with anxiety which has been severe and difficult to treat.  He is also on lorazepam 1 mg 3 times daily. Certainly several of the medicines could contribute to tiredness and weakness but he has been on them a long time.  It is difficult to separate that from his Parkinson's disease.  It would be difficult to reduce the dosages any further because we would drop below any affective dosage.  The most recently added medicine sertraline can certainly cause nausea and some people suggested the option of holding that for a week to see if the nausea gets better but is not likely to be causing any tiredness.  She is not certain that he is taking the sertraline but will check with him about it.  Please call if there are any other questions or concerns.  Lynder Parents, MD, DFAPA

## 2021-10-27 NOTE — Telephone Encounter (Signed)
PA requesting to speak to you about mutual pt

## 2021-10-27 NOTE — Telephone Encounter (Signed)
Do you know her? Are you able to contact her?

## 2021-11-01 ENCOUNTER — Other Ambulatory Visit: Payer: Self-pay | Admitting: Psychiatry

## 2021-11-11 ENCOUNTER — Telehealth: Payer: Self-pay | Admitting: Psychiatry

## 2021-11-11 NOTE — Telephone Encounter (Signed)
Pt's sister, Vivien Rota, LVM @ 3:50p.  She is on DPR.  She said she needed to talk to someone about Steve's "issues".  Pls call her back at [phone].   Next appt 11/14

## 2021-11-11 NOTE — Telephone Encounter (Signed)
Bill Guerrero stated pt has a hard time "getting straightened out" in the mornings and can't get the strength to walk until 10 am.She said that every day at 2-3pm pt gets so sleepy that he can't function and he is pretty much in bed for the remainder of the day.She said he really needs to be seen sooner then 11/14.I DID NOT OFFER A FRIDAY APPT.Bill Guerrero did mention that a Friday is the only day she can bring him to an appt so just fyi if we add him to cancellation list he may not be able to be seen if it's not a friday or a phone visit.She wants to be with him for the visit or speak to you over the phone about his options if possible.

## 2021-11-13 NOTE — Telephone Encounter (Signed)
I have no way of knowing with certainty whether these problems are related to the Parkinson's disease and it's medications or his psychiatric medicines.  The Sx are not psychiatric symptoms but it could be a side effect of any of several medicines. Therefore the next best strategy would be to try reducing the Seroquel again from XR 200 mg daily to XR 150 mg nightly.  Before I send in the prescription please call and verify which current dosage of Seroquel he is taking.  A previous phone call indicated he had both 300 mg tablets and 200 mg tablets.  Verify that he is currently taking quetiapine ER 200 mg every afternoon.  Once that is clarified I consented to prescription for quetiapine ER 150 mg nightly.

## 2021-11-14 ENCOUNTER — Other Ambulatory Visit: Payer: Self-pay | Admitting: Psychiatry

## 2021-11-14 MED ORDER — QUETIAPINE FUMARATE 150 MG PO TABS: 150.0000 mg | ORAL_TABLET | Freq: Every day | ORAL | 0 refills | Status: DC

## 2021-11-14 NOTE — Telephone Encounter (Signed)
Tell patient to stop the quetiapine immediate release 200 mg tablets or tell his caretaker.  I sent in a prescription for a lower dose of quetiapine 150 mg tablet 1 at night to the [facility].  So we are reducing the dosage further in hopes of improving his daytime alertness.  If he starts having more mood symptoms or psychotic symptoms then let us know.

## 2021-11-14 NOTE — Telephone Encounter (Signed)
Son informed.

## 2021-11-14 NOTE — Telephone Encounter (Signed)
I spoke to him and his son.He is taking IR 200 mg at night.They don't give him xr anymore his son stated you told them to switch back to IR.He has an appt next week with his parkinson's dr as well

## 2021-11-14 NOTE — Telephone Encounter (Signed)
reviewed

## 2021-11-25 ENCOUNTER — Telehealth: Payer: Self-pay | Admitting: Psychiatry

## 2021-11-25 NOTE — Telephone Encounter (Signed)
They have cut the dose of quetiapine to a point that it is now unlikely to have any mood benefit.  The only benefit might be sleep.  Even though he reports sleeping a lot , he should not stop Seroquel abruptly DT risk of withdrawal.  He is at risk of having depression, mania or psychosis now.  I cannot start another medication without seeing him.  He has an appt in 2-3 weeks and needs to come in person to that appt.  If he gets worse in the interim I would recommend they take him to [facility] ER where he's been seen by neurology before.  There the psychiatrists and neurologists could work together if he meets admission criteria.  Pls call the son and pt with this info

## 2021-11-25 NOTE — Telephone Encounter (Signed)
Called patient's son per pt request. Son said patient is calling him telling him he is sleeping 20 hours a day, he is losing his mind, and he needs someone to fix him now. Son said he has taken him to PCP and his Parkinson's physician (Dr. Lucile Crater) and if any changes are made they aren't helping. Last visit you reduced his Seroquel to 150 mg, but Dr. Lucile Crater wants it down to 100. Son said they had just filled the 150 mg and are cutting the tablet in half so he is getting 75 mg. Patient is in independent living and there is not assisted living in his current facility. Patient didn't want to go to assisted living because the rooms were too small. Patient had said he can't sleep in his bed because he can't get up during the night to the RR, but son said he has never tried and right now he has clothes piled up on the bed so he couldn't sleep in it if he wanted to. Son said his house was always very clean.   Son does not know what to do at this point.

## 2021-11-25 NOTE — Telephone Encounter (Signed)
Alfonzia himself called today at 11:50 to report that his depression and anxiety are the worse it has ever been.  He said his family has been trying to get help from Dr. Clovis Pu but he doesn't think there has been any response.  I asked him if his medication had been changed last week and he said he didn't know.  Someone else manages his medication. He said he is not good on the phone and what he is told he forgets as soon as he hangs up.  He asked that we call his son, Raquel Sarna Franki [place]ot) [phone] and discuss with him what can be done.  He is desperate for help.  Next appt is 11/14

## 2021-11-26 NOTE — Telephone Encounter (Signed)
LVM to RC 

## 2021-11-26 NOTE — Telephone Encounter (Signed)
Talked with son. He said patient is asking for help NOW. I told him that Dr. Clovis Pu wanted to see him before he makes any changes. Told him about going to ER in [facility]. Son said sounded like we were just trying to push him off on someone else and hung up.

## 2021-11-28 NOTE — Telephone Encounter (Signed)
LVM to [facility] for patient.

## 2021-12-01 NOTE — Telephone Encounter (Signed)
LVM to [facility] - second attempt.

## 2021-12-02 NOTE — Telephone Encounter (Signed)
LVM to [facility] - third attempt

## 2021-12-09 ENCOUNTER — Encounter (HOSPITAL_COMMUNITY): Payer: Self-pay | Admitting: Emergency Medicine

## 2021-12-09 ENCOUNTER — Emergency Department (HOSPITAL_COMMUNITY): Payer: Medicare Other

## 2021-12-09 ENCOUNTER — Other Ambulatory Visit: Payer: Self-pay

## 2021-12-09 ENCOUNTER — Emergency Department (HOSPITAL_COMMUNITY)
Admission: EM | Admit: 2021-12-09 | Discharge: 2021-12-10 | Discharge status: Against Medical Advice | Payer: Medicare Other | Attending: Student | Admitting: Student

## 2021-12-09 DIAGNOSIS — Z5321 Procedure and treatment not carried out due to patient leaving prior to being seen by health care provider: Secondary | ICD-10-CM | POA: Insufficient documentation

## 2021-12-09 DIAGNOSIS — R5383 Other fatigue: Secondary | ICD-10-CM | POA: Diagnosis present

## 2021-12-09 NOTE — ED Triage Notes (Addendum)
Pt states he has a long history of depression.  States he has not been able to get into contact with his previous psychiatrist.  Pt states he was told that he needs to find a psychiatrist who can work in conjunction with his neurologist for his parkinson's.  Pt has debilitating fatigue and spends a lot of time sleeping. Feels that he never will wake up.  Pt states he questions "why it's all worth it"  Pt and son states this may be a "medication problem" as they had no idea how to administer his meds after his wife passed in April and have been "guessing"

## 2021-12-09 NOTE — ED Provider Triage Note (Signed)
Emergency Medicine Provider Triage Evaluation Note  HARLEM BULA , a 71 y.o. male  was evaluated in triage.  Pt complains of generalized fatigue.  Patient is very sleepy, feels like he is unable to perform daily tasks of living.  He is questioning the point of life but denies any active SI or HI.  Has not had any fevers at home, cough, chills.  He states he tripped yesterday and fell forward he did not hit his head or lose consciousness.  It was mechanical fall, having pain to the hips bilaterally since then..  Review of Systems  Per HPI  Physical Exam  BP 130/79 (BP Location: Right Arm)   Pulse 73   Temp 99 F (37.2 C) (Oral)   Resp 16   SpO2 100%  Gen:   Awake, no distress   Resp:  Normal effort  MSK:   Moves extremities without difficulty  Other:  Resting tremor   Medical Decision Making  Medically screening exam initiated at 3:41 PM.  Appropriate orders placed.  Deklyn Gibbon Shatswell was informed that the remainder of the evaluation will be completed by another provider, this initial triage assessment does not replace that evaluation, and the importance of remaining in the ED until their evaluation is complete.     Sherrill Raring, PA-C 12/09/21 1544

## 2021-12-09 NOTE — ED Notes (Signed)
Patient son states he is taking patient home

## 2021-12-10 ENCOUNTER — Other Ambulatory Visit: Payer: Self-pay | Admitting: Psychiatry

## 2021-12-10 ENCOUNTER — Telehealth: Payer: Self-pay

## 2021-12-10 NOTE — Telephone Encounter (Signed)
Pt called at  1:58p.  He said he called several weeks ago and hasn't heard back from anyone.  He is suffering terribly from anxiety and depression.  He is also sleeping 22 hours a day.  "It is ruining his life".  He needs someone to call him back.  Next appt 11/14

## 2021-12-10 NOTE — Telephone Encounter (Signed)
Tell them to stop sertraline bc it's not helping anxiety.  Stop Seroquel.  Start Nuplazid one nightly.  I sent in RX.  They need to keep his appt here next week.

## 2021-12-11 ENCOUNTER — Other Ambulatory Visit: Payer: Self-pay | Admitting: Psychiatry

## 2021-12-11 MED ORDER — NUPLAZID 34 MG PO CAPS: 1.0000 | ORAL_CAPSULE | Freq: Every evening | ORAL | 0 refills | Status: DC

## 2021-12-11 NOTE — Telephone Encounter (Signed)
Patient called back but said he can't remember the changes and he asked me to call his son. I told him I would.

## 2021-12-11 NOTE — Telephone Encounter (Signed)
Talked with patient and he said he could not remember medication changes and asked me to call son, which I did. Son is aware of his appt on Tuesday. Nuplazid Rx had not be called in that I can see.  Should go to [facility] on [address].

## 2021-12-11 NOTE — Telephone Encounter (Signed)
Sent prescription for Nuplazid to [facility] [address] [facility].  I am not sure however if this has to be sent to a specialty pharmacy?  Please make sure they also stop sertraline and stop quetiapine as soon as they start Nuplazid.

## 2021-12-11 NOTE — Progress Notes (Signed)
Sent prescription for Nuplazid to [facility] [address] [facility].  I am not sure however if this has to be sent to a specialty pharmacy?  Please make sure they also stop sertraline and stop quetiapine as soon as they start Nuplazid.

## 2021-12-11 NOTE — Telephone Encounter (Signed)
LVM to RC 

## 2021-12-11 NOTE — Telephone Encounter (Signed)
Left another VM to [facility].  

## 2021-12-12 NOTE — Telephone Encounter (Signed)
Patient and son were notified of recommendations.

## 2021-12-12 NOTE — Telephone Encounter (Signed)
LVM to RC 

## 2021-12-12 NOTE — Telephone Encounter (Signed)
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 Options are [facility], [facility], [facility], [facility].   Depending on his insurance requirements. I do not see any pharmacy benefits in epic so we need to contact pt on where is insurance requires speciality medication to go.

## 2021-12-12 NOTE — Telephone Encounter (Signed)
Pls ask son to come get samples to last until his appt.

## 2021-12-12 NOTE — Telephone Encounter (Addendum)
Pulled samples and called son. He said he will try to swing by this afternoon.   Son picked up samples 3:24.

## 2021-12-15 ENCOUNTER — Emergency Department (HOSPITAL_COMMUNITY): Payer: Medicare Other

## 2021-12-15 ENCOUNTER — Emergency Department (HOSPITAL_COMMUNITY)
Admission: EM | Admit: 2021-12-15 | Discharge: 2021-12-16 | Disposition: A | Discharge status: Home / Self Care | Payer: Medicare Other | Attending: Emergency Medicine | Admitting: Emergency Medicine

## 2021-12-15 ENCOUNTER — Other Ambulatory Visit: Payer: Self-pay

## 2021-12-15 DIAGNOSIS — R32 Unspecified urinary incontinence: Secondary | ICD-10-CM | POA: Insufficient documentation

## 2021-12-15 DIAGNOSIS — R531 Weakness: Secondary | ICD-10-CM | POA: Diagnosis present

## 2021-12-15 LAB — COMPREHENSIVE METABOLIC PANEL
ALT: 6 U/L (ref 0–44)
AST: 16 U/L (ref 15–41)
Albumin: 4.2 g/dL (ref 3.5–5.0)
Alkaline Phosphatase: 72 U/L (ref 38–126)
Anion gap: 8 (ref 5–15)
BUN: 7 mg/dL — ABNORMAL LOW (ref 8–23)
CO2: 25 mmol/L (ref 22–32)
Calcium: 9.3 mg/dL (ref 8.9–10.3)
Chloride: 106 mmol/L (ref 98–111)
Creatinine, Ser: 0.75 mg/dL (ref 0.61–1.24)
GFR, Estimated: >60 mL/min (ref >60)
Glucose, Bld: 114 mg/dL — ABNORMAL HIGH (ref 70–99)
Potassium: 3.6 mmol/L (ref 3.5–5.1)
Sodium: 139 mmol/L (ref 135–145)
Total Bilirubin: 1.1 mg/dL (ref 0.3–1.2)
Total Protein: 6.7 g/dL (ref 6.5–8.1)

## 2021-12-15 LAB — I-STAT CHEM 8, ED
BUN: 8 mg/dL (ref 8–23)
Calcium, Ion: 1.12 mmol/L — ABNORMAL LOW (ref 1.15–1.40)
Chloride: 106 mmol/L (ref 98–111)
Creatinine, Ser: 0.6 mg/dL — ABNORMAL LOW (ref 0.61–1.24)
Glucose, Bld: 111 mg/dL — ABNORMAL HIGH (ref 70–99)
HCT: 40 % (ref 39.0–52.0)
Hemoglobin: 13.6 g/dL (ref 13.0–17.0)
Potassium: 3.6 mmol/L (ref 3.5–5.1)
Sodium: 139 mmol/L (ref 135–145)
TCO2: 25 mmol/L (ref 22–32)

## 2021-12-15 LAB — LACTIC ACID, PLASMA: Lactic Acid, Venous: 1.1 mmol/L (ref 0.5–1.9)

## 2021-12-15 LAB — CBC WITH DIFFERENTIAL/PLATELET
Abs Immature Granulocytes: 0.05 10*3/uL (ref 0.00–0.07)
Basophils Absolute: 0.1 10*3/uL (ref 0.0–0.1)
Basophils Relative: 1 %
Eosinophils Absolute: 0.1 10*3/uL (ref 0.0–0.5)
Eosinophils Relative: 1 %
HCT: 40.6 % (ref 39.0–52.0)
Hemoglobin: 13.9 g/dL (ref 13.0–17.0)
Immature Granulocytes: 1 %
Lymphocytes Relative: 11 %
Lymphs Abs: 1 10*3/uL (ref 0.7–4.0)
MCH: 33.3 pg (ref 26.0–34.0)
MCHC: 34.2 g/dL (ref 30.0–36.0)
MCV: 97.4 fL (ref 80.0–100.0)
Monocytes Absolute: 0.9 10*3/uL (ref 0.1–1.0)
Monocytes Relative: 10 %
Neutro Abs: 6.9 10*3/uL (ref 1.7–7.7)
Neutrophils Relative %: 76 %
Platelets: 211 10*3/uL (ref 150–400)
RBC: 4.17 MIL/uL — ABNORMAL LOW (ref 4.22–5.81)
RDW: 12.6 % (ref 11.5–15.5)
WBC: 9 10*3/uL (ref 4.0–10.5)
nRBC: 0 % (ref 0.0–0.2)

## 2021-12-15 LAB — URINALYSIS, ROUTINE W REFLEX MICROSCOPIC
Bacteria, UA: NONE SEEN
Bilirubin Urine: NEGATIVE
Glucose, UA: NEGATIVE mg/dL
Hgb urine dipstick: NEGATIVE
Ketones, ur: 5 mg/dL — AB
Leukocytes,Ua: NEGATIVE
Nitrite: NEGATIVE
Protein, ur: 30 mg/dL — AB
Specific Gravity, Urine: 1.019 (ref 1.005–1.030)
pH: 6 (ref 5.0–8.0)

## 2021-12-15 LAB — CBG MONITORING, ED: Glucose-Capillary: 109 mg/dL — ABNORMAL HIGH (ref 70–99)

## 2021-12-15 NOTE — ED Provider Triage Note (Signed)
Emergency Medicine Provider Triage Evaluation Note  Bill Guerrero , a 71 y.o. male  was evaluated in triage.  Pt complains of generalized weakness and urinary incontinence. Brought in by EMS from facility, parkinsons, depression, generally weak and voiding on self. ? Warm to the touch Review of Systems  Positive: Urinary incontinence, weakness Negative: Numbness, fever, vomiting, abdominal pain  Physical Exam  There were no vitals taken for this visit. Gen:   Awake, no distress   Resp:  Normal effort  MSK:   Moves extremities without difficulty  Other:  Abdomen soft and non tender   Medical Decision Making  Medically screening exam initiated at 11:23 AM.  Appropriate orders placed.  Bill Guerrero was informed that the remainder of the evaluation will be completed by another provider, this initial triage assessment does not replace that evaluation, and the importance of remaining in the ED until their evaluation is complete.     Tacy Learn, PA-C 12/15/21 1124

## 2021-12-15 NOTE — ED Provider Notes (Signed)
[facility] EMERGENCY DEPARTMENT Provider Note   CSN: [id number] Arrival date & time: 12/15/21  1120     History {Add pertinent medical, surgical, social history, OB history to HPI:1} Chief Complaint  Patient presents with   Weakness   Urinary Incontinence    ABDIMALIK MAYORQUIN is a 71 y.o. male.  STEPHAUN MILLION is a 71 year old male with a history of Parkinson's disease, depression, chronic low back pain, urinary incontinence, melanoma who presents to the emergency department from skilled nursing facility for generalized weakness. He reports that he has had some worsening depression lately as a consequence of his progressing Parkinson's disease, his wife passing away in April, his daughter moving away, and having to sell his house.  He states that he now lives in retirement community, but overall does not like it there and says the people are nice.  Around 2 months ago, he started getting sleepier in the afternoon, and has required more more time in bed, now spending almost 20 hours a day in bed.  He states that he just feels generally weak and sleepy.  He is followed by a psychiatrist for his depression, and takes Seroquel, lorazepam, lithium, and was previously on Zoloft.  His psychiatrist just changed his medications on Friday, removing the Seroquel and Zoloft and starting a new medication.  He states that this did help him feel better the first few days, but he is felt fatigued again today.  He notices the weakness more in his legs, but does not have significant back pain, changes in sensation in his legs, perianal or saddle area, fecal or significant urinary incontinence (he did urinate on himself the other day, but only because he could not get to the urinal that was across the room and this was intentional) or fever.  He denies any vomiting, bleeding, melena, hematochezia, fever, chest pain, shortness of breath, or abdominal pain.  The history is provided by the  patient, the EMS personnel and medical records.  Weakness      Home Medications Prior to Admission medications   Medication Sig Start Date End Date Taking? Authorizing Provider  AMBULATORY NON FORMULARY MEDICATION Upright walker Dx: [name] 10/27/19   [name], [name], DO  Ascorbic Acid (VITAMIN C) 1000 MG tablet Take 1,000 mg by mouth 2 (two) times daily.    [provider]  B Complex-C (B-COMPLEX WITH VITAMIN C) tablet Take 1 tablet by mouth daily.    [provider]  carbidopa-levodopa (SINEMET CR) 50-200 MG tablet TAKE ONE TABLET BY MOUTH EVERY NIGHT AT BEDTIME Patient not taking: Reported on 07/15/2021 12/12/20   [name], [name], DO  carbidopa-levodopa (SINEMET IR) 25-100 MG tablet TAKE 2 TABLETS BY MOUTH AT 8AM, 2 TABLETS AT NOON, 2 TABLETS AT 4PM, AND 2 TABLETS AT 8PM 04/28/21   [name], [name], DO  Cholecalciferol (VITAMIN D-3) 5000 UNITS TABS Take 1 tablet by mouth daily.    [provider]  lamoTRIgine (LAMICTAL) 100 MG tablet Take 1 tablet (100 mg total) by mouth 2 (two) times daily. 10/23/21   [name], [name], MD  lithium carbonate (LITHOBID) 300 MG CR tablet TAKE ONE TABLET BY MOUTH AT BEDTIME 06/17/21   [name], [name], MD  LORazepam (ATIVAN) 2 MG tablet TAKE 1/2 TABLET BY MOUTH EVERY 6 HOURS AS NEEDED FOR ANXIETY 08/04/21   [name], [name], MD  losartan (COZAAR) 50 MG tablet Take 50 mg by mouth daily. Pt. Is unsure of Mg.  [provider]  magnesium oxide (MAG-OX) 400 MG tablet Take 400 mg by mouth daily.    [provider]  niacin 500 MG tablet Take 500 mg by mouth daily with breakfast.    [provider]  Omega-3 Fatty Acids (FISH OIL BURP-LESS PO) Take 1 capsule by mouth in the morning and at bedtime.    [provider]  Opicapone 50 MG CAPS Take 50 mg by mouth daily. 12/03/20   [name], [name], DO  OXcarbazepine (TRILEPTAL) 150 MG tablet TAKE 1 TABLET BY MOUTH TWICE A DAY 09/26/21   [name], [name], MD   Pimavanserin Tartrate (NUPLAZID) 34 MG CAPS Take 1 capsule (34 mg total) by mouth at bedtime. 12/11/21   [name], [name], MD  Potassium 99 MG TABS Take 1 tablet by mouth daily.    [provider]  pramipexole (MIRAPEX) 0.5 MG tablet TAKE ONE TABLET BY MOUTH THREE TIMES A DAY Patient not taking: Reported on 07/15/2021 04/03/21   [name], [name], DO  pravastatin (PRAVACHOL) 80 MG tablet Take 80 mg by mouth daily before breakfast.    [provider]      Allergies    Patient has no known allergies.    Review of Systems   Review of Systems  Neurological:  Positive for weakness.    Physical Exam Updated Vital Signs BP 130/70 (BP Location: Right Arm)   Pulse 76   Temp 98 F (36.7 C) (Oral)   Resp 18   SpO2 98%  Physical Exam  ED Results / Procedures / Treatments   Labs (all labs ordered are listed, but only abnormal results are displayed) Labs Reviewed  COMPREHENSIVE METABOLIC PANEL - Abnormal; Notable for the following components:      Result Value   Glucose, Bld 114 (*)    BUN 7 (*)    All other components within normal limits  CBC WITH DIFFERENTIAL/PLATELET - Abnormal; Notable for the following components:   RBC 4.17 (*)    All other components within normal limits  I-STAT CHEM 8, ED - Abnormal; Notable for the following components:   Creatinine, Ser 0.60 (*)    Glucose, Bld 111 (*)    Calcium, Ion 1.12 (*)    All other components within normal limits  CBG MONITORING, ED - Abnormal; Notable for the following components:   Glucose-Capillary 109 (*)    All other components within normal limits  LACTIC ACID, PLASMA  URINALYSIS, ROUTINE W REFLEX MICROSCOPIC    EKG None  Radiology No results found.  Procedures Procedures  {Document cardiac monitor, telemetry assessment procedure when appropriate:1}  Medications Ordered in ED Medications - No data to display  ED Course/ Medical Decision Making/ A&P                           Medical Decision  Making  ***  {Document critical care time when appropriate:1} {Document review of labs and clinical decision tools ie heart score, Chads2Vasc2 etc:1}  {Document your independent review of radiology images, and any outside records:1} {Document your discussion with family members, caretakers, and with consultants:1} {Document social determinants of health affecting pt's care:1} {Document your decision making why or why not admission, treatments were needed:1} Final Clinical Impression(s) / ED Diagnoses Final diagnoses:  None    Rx / DC Orders ED Discharge Orders     None

## 2021-12-15 NOTE — ED Triage Notes (Signed)
Pt from [facility] with hx of Parkinson's disease here for eval of 12 hour hx of weakness and urinary incontinence. Urine is foul smelling. VSS. Per son, patient has hx of depression and would like that addressed during this hospitalization.

## 2021-12-15 NOTE — ED Notes (Signed)
Patient transported to CT 

## 2021-12-16 ENCOUNTER — Ambulatory Visit (INDEPENDENT_AMBULATORY_CARE_PROVIDER_SITE_OTHER): Payer: Medicare Other | Admitting: Psychiatry

## 2021-12-16 ENCOUNTER — Telehealth: Payer: Self-pay

## 2021-12-16 ENCOUNTER — Encounter: Payer: Self-pay | Admitting: Psychiatry

## 2021-12-16 DIAGNOSIS — F3130 Bipolar disorder, current episode depressed, mild or moderate severity, unspecified: Secondary | ICD-10-CM

## 2021-12-16 DIAGNOSIS — F411 Generalized anxiety disorder: Secondary | ICD-10-CM

## 2021-12-16 DIAGNOSIS — F5105 Insomnia due to other mental disorder: Secondary | ICD-10-CM

## 2021-12-16 DIAGNOSIS — F4001 Agoraphobia with panic disorder: Secondary | ICD-10-CM

## 2021-12-16 DIAGNOSIS — G3184 Mild cognitive impairment, so stated: Secondary | ICD-10-CM

## 2021-12-16 DIAGNOSIS — F40240 Claustrophobia: Secondary | ICD-10-CM

## 2021-12-16 MED ORDER — LITHIUM CARBONATE ER 300 MG PO TBCR: 300.0000 mg | EXTENDED_RELEASE_TABLET | Freq: Every day | ORAL | 1 refills | Status: DC

## 2021-12-16 MED ORDER — LAMOTRIGINE 100 MG PO TABS: 100.0000 mg | ORAL_TABLET | Freq: Two times a day (BID) | ORAL | 1 refills | Status: DC

## 2021-12-16 NOTE — Discharge Instructions (Addendum)
You were seen in the emergency department for weakness.   While you were here we performed a physical exam, checked labs, and a head CT.  These were all reassuring, showing no emergency cause of your weakness today.  Please follow-up with your psychiatrist as soon as possible to discuss your depression medications as these may be making you sleepy.  Please return to emergency department if you develop high fevers, weakness on 1 side your body, slurred speech, severe chest pain or difficulty breathing, or if you have any other reason to think you need emergency care.  We hope you feel better soon.

## 2021-12-16 NOTE — ED Notes (Signed)
Bill Guerrero contacted, states that he is going to come to pick pt up.

## 2021-12-16 NOTE — ED Notes (Signed)
Patient verbalizes understanding of discharge instructions. Opportunity for questioning and answers were provided. Armband removed by staff, pt discharged from ED. Pt taken to ED entrance via wheelchair. Pt transported back to independent living by son Bill Guerrero.

## 2021-12-16 NOTE — Progress Notes (Signed)
Bill Guerrero [id number] 09/10/1950 71 y.o.  Virtual Visit via Telephone Note  I connected with pt by telephone and verified that I am speaking with the correct person using two identifiers.   I discussed the limitations, risks, security and privacy concerns of performing an evaluation and management service by telephone and the availability of in person appointments. I also discussed with the patient that there may be a patient responsible charge related to this service. The patient expressed understanding and agreed to proceed.  I discussed the assessment and treatment plan with the patient. The patient was provided an opportunity to ask questions and all were answered. The patient agreed with the plan and demonstrated an understanding of the instructions.   The patient was advised to call back or seek an in-person evaluation if the symptoms worsen or if the condition fails to improve as anticipated.  I provided 30 minutes of non-face-to-face time during this encounter. The call started at 230 and ended at 3:00. The patient was located at home and the provider was located office.    Subjective:   Patient ID:  Bill Guerrero is a 71 y.o. (DOB 1950/02/16) male.  Chief Complaint:  Chief Complaint  Patient presents with   Follow-up    Bipolar I disorder, most recent episode depressed ([place])   Depression   Anxiety   Medication Reaction    sleepiness    HPI Bill Guerrero presents to the office today for follow-up of mood disorder, anxiety, and chronic insomnia.  He is chronically stressed and down due to the combination of dealing with Parkinson's disease and history of cancer.  seen in march 2021.  No meds were changed. The following was noted: Started playing banjo and taking lessons.  Practices daily.   Stress many things he can no longer do physically.  Can't do housework like before.  Getting him down.  Pushing himself to walk with a walker.  Pain in walking  without walker.   Has done PT and finished. Accidentally dropped dose of Trileptal for ? Time frame without a problem to 150 mg daily.  No mood swings noticed.   "Made it longer than they thought I would".  Still in remission with melanoma with follow up tomorrow.   But hard to get around.  Still has bad days emotionally up to a week at a time.  Anxiety is worse than depression.  Easily overwhelmed.   Pattern of EMA at 3 am and then falls asleep watching TV.  Become a habit. Then naps.   Occ forgets Seroquel.  09/18/19 appt with the following noted: Bad day with weakness and PD.  Fell yesterday down 3-4 steps. It's  Getting worse. Not getting better on the banjo.   Started [place].  Interest in music daily.   It's so much fun. Hallucinated on amantadine and fell and stopped. Little Ativan. No current treatment for cancer. Questions about timing of quetiapine and gets dizzy and weak Plan: Consider reducing quetiapine ER to minimize SE but risk increase mood problems. Currently seems to be doing ok with depression and mood swings.  Yes bc he gets up at night, REDUCE quetiapine to ER 200 mg nightly.  01/18/20 appt with following noted: Asks for jury excuse due to Parkinson's Dz with cognitive impairment, alertness and poor memory. Tries to walk every other day.  Latley more back pain has gotten him really down.  Then can think what's the purpose of life.  His family is not supportive.  Wife and son not very helpful.  Had bad feelings about it.  Frustrated with family.  Down for a couple of weeks.  PD progressed last 6 mos and harder at times to move.   Was falling a lot 6 mos ago, but not in the last 6 mos. No difference with reduction in Seroquel and still feels real hung over in the day. Plan: Apparently no worse with reduction quetiapine to ER 200 mg nightly,  DT drowsiness reduce again to 150 mg ER HS  05/01/2020 appointment with the following noted:  More anxiety, but no worsening manic  sx.  Sleep MN to 3 AM and then after a few days sleep more.  Same pattern as 6 mos or longer.  No one else notices any changes with reduction.  He notices no changes with PD.  The off times with PD have been more abrupt.  Takes a long time to do things.  Family just thinks he's slow. No changes with mood. 10 years ago slept 8 hour night.  Not that sleepy during the day lately. Rare lorazepam. Plan: Reduce oxcarbazepine to 1/2 tablet at night for 2 weeks and if sleep is unchanged then stop it. The goal is to minimize meds that might interfere with Parkinson's medications or increased risk of falling.  06/18/2020 phone call from patient's wife stating mist he was having more anxiety and panic attacks at night and wondering if it was related to med changes. MD response was that it was possible that oxcarbazepine discontinuance could lead to more anxiety or even mania.  He should let us know if he was having any manic symptoms.  Reviewed the purpose of the med change was to minimize drug interactions with Parkinson's disease medications and therefore rather than restarting the oxcarbazepine we would add lorazepam 0.5 to 1 mg nightly or every 8 hours as needed anxiety.  And to let us know if that failed.  07/02/2020 appointment with the following noted: About every night 6-7 PM get "dreads".  Dreading wife going to bed and him being alone.  To the point of crying and begging wife to stay up with him.  For the last week using it regularly in the afternoon about 2 mg lorazepam.   New problems since stopping the oxcarbazepine.   PD sx are not better off the Trileptal.  Overall sx seems to get a little worse. Satisfied with meds.  No other new concerns.  Rarely uses Ativan except when goes to doctors' offices or with CT scans Dt claustrophobia. Pt reports that mood is Anxious and describes anxiety as Moderate. Anxiety symptoms include: Excessive Worry,. Pt reports has interrupted sleep, not much change off  Trileptal. Pt reports that appetite is good. Pt reports that energy is lethargic and no change. Concentration is down slightly. Suicidal thoughts:  denied by patient.  Less dep and anxiety than last visit. Melanoma responded really well to immunotherapy cancer treatment.   Discussed concerns about the cost of Seroquel Exar and how bad he feels if he runs out of the medication. Plan: Apparently no worse with reduction quetiapine to 150 mg ER HS . Continue it. Less drowsiness now  Call if sleep worsens or mania. Restart oxcarbazepine to 1/2 tablet at night for 1 weeks and then 1 each evening..To stop the sense of dread and anxiety in the evening.  08/02/2020 urgent appointment with the following noted: Several recent phone calls complaining of increased anxiety unresolved by attempts to adjust medication over the phone.  Worst panic attacks I've ever had, now daily.  Start early afternoon over worry he'll be alone at night bc wife in bed and he's awake.  Crying.  Can get a claustrophobic feeling and fear of not being able to move and see his feet. Near blackout spells. Out of nowhere.   Only lasts seconds.   Unrealistic fear that he'll die despite positive exams that don't support the fear. Plan: Apparently worse with reduction quetiapine to 150 mg ER HS bc severe anxiety with fear he'll die.  Nothing better with less medication and in fact worse. Increase quetiapine back to ER 200 mg HS. Less drowsiness now  Call if sleep worsens or mania. Increase oxcarbazepine to 1 tablet in AM and  each evening..To stop the sense of dread and anxiety in the evening.   08/09/2020 phone call:  RTC  Couple good days and now anxiety and panic hit and shaking and afraid to be alone tonight.  Scared of big panic attacks and doesn't want to be alone.  1 week ago increased quetiapine ER 200 HS and oxcarb to 150 mg 1 BID.  Tired and not enough sleep DT panic.  Afraid of being alone. Takes lorazepam prn 2 mg .  Most days  taking 2 daily.  Doesn't make him sleepy.  Took it at 4 pm today.    Increase oxcarbazepine to 1 in AM  and 2 at 2 PM and then take 2 mg lorazepam about an hour before anxiety worsens about 3 PM.  Disc Cog techniques.  Lynder Parents, MD, [facility]  09/23/2020 appointment with the following noted: Anxiety worse if anything and especially night.  No sleep since Thursday.  Sleeps in chair bc back pain.  Sleeps downstairs away from wife who goes to bed at 8 pm.  Alone a lot and doesn't like it.   Taking 2 mg lorazepam daily for the last couple of weeks. Takes 20-30 min to kick in. Still some panic but not nightly and serious about once per week. Trouble sleeping bc is anxious and it can hit wiithot reason or build over time.  Usually comes on fast. [facility] started taking him out weekly and that helps. Trying to walk but it's gotten harder.   Rare napping. Jittery feeling in the morning. Plan: Apparently worse with reduction quetiapine to 150 mg ER HS bc severe anxiety with fear he'll die.  Nothing better with less medication and in fact worse. Continue quetiapine back to ER 200 mg HS vs switch to IR.  Extensive discussion about differences. Yes for better sleep switch to IR.    FALL precautions. Take lorazepam 2 mg every evening at 6 PM. Less drowsiness now  Call if sleep worsens or mania. Continue oxcarbazepine to 1 tablet in AM and  each evening..To stop the sense of dread and anxiety in the evening.  02/24/2021 phone call: Charlesetta Ivory Pt's wife called reporting Pt is crying and saying he wants to die. Nurse contact with wife: This message was sent as high priority but pt is stable right now. After she gave him 2 of the Lorazepam he did calm down, but remains nervous. Pt request she call. He does have apt Wednesday. Pt's wife reports pt is very anxious and nervous and doesn't like to be left alone in a room or by himself at the house. Very fearful. She reports she stayed home with him today but will be  returning to work tomorrow. She reports this has been happening off and  on for 2 weeks, pt is sleeping well at night. No other symptoms reported.   I instructed her okay to give lorazepam later this evening or night if needed and sounded like while she is at work he will also need it for being alone. Informed her I would touch base with Dr. Clovis Pu and if he recommended anything prior to apt. She didn't feel there probably was anything.  MD response: Patient is apparently not suicidal just highly distressed.  Agree with lorazepam 2 mg 3 times daily until the appointment in 2 days. May consider clonidine  02/26/2021 appointment with the following noted: Even after Ativan 2 mg anxiety is bad and never been this bad.  Can't stand to be alone, even if wife in the house but sleeping upstairs.  Yesterday beg wife for help.  Afraid he'll die in his sleep. Only Ativan 2 mg TID once.   Kind of drugged feeling for the past few weeks. Taking Seroquel 200 mg HS, lamotrigine 200 mg daily, oxcarbazepine 150 BID, pramipexole 0.5 mg TID Sleep better with switch to Seroquel IR 200 mg HS Plan: More panic to unmanageable degree.  More dependence behavior bc of anxiety Disc off label use of clonidine for bipolar and anxiety. Push fluids and fall precautions Clonidine 0.1 mg tablet 1/2 at night for 2 nights, then 1/2 tablet twice daily for 4 nights, then 1/2 in the AM and 1 tablet at night for 4 nights then 1 twice daily Continue quetiapine  200 mg HS IR.   FALL precautions. Take lorazepam 2 mg TID until better with clonidine   03/21/2021 TC  complaining of feeling drugged with a combination of clonidine and lorazepam.  He was encouraged to gradually reduce lorazepam 2 mg tablet to 1/2 tablet 3 times daily and 2 mg at bedtime.  04/18/2021 telephone note: Note RTC [phone].  Started getting depressed again about 4 days ago.  No SI but purposelessness.   04-07-21 neuro started reducing pramipexole to switch bc  hypersexual thoughts causing problems with his wife.  Right now depression worse than anxiety.  Doesn't want to be left alone.  Trouble keeping up meds.    Can't tell if clonidine helps anxiety but is taking lorazepam only a couple of times.  Accidentally stopped lamotrigine at some point and doesn't know when. Restart lamotrigine and increase to 100 mg BID  Counseled patient regarding potential benefits, risks, and side effects of Lamictal to include potential risk of Stevens-Johnson syndrome. Advised patient to stop taking Lamictal and contact office immediately if rash develops and to seek urgent medical attention if rash is severe and/or spreading quickly. Will start Lamictal 25 mg daily for 2 weeks, then increase to 50 mg daily for 2 weeks, then 100 mg daily for 2 weeks, then 150 mg daily for mood symptoms.   Add low dose lithium to speed recover and for SI 300 mg daily.  He agrees to the plan  Lynder Parents, MD, [facility]       04/29/2021 appointment with the following noted: seen with sister Not good with more depression the last couple of weeks.  Cry daily.  Tells sister he has SI and thoughts scare him.  Doesn't think he would do it.  Feels like he's sick and might die.  Still afraid of sleeping alone downstairs. Both scared to die and sometimes wants to die. Taking lihtium 300 mg HS, clonidine 0.1 mg BID, lamotrigine 100 BID, Trileptal 150 BID, quetiapine 200 mg HS, less lorazepam. Looking  at asst living facility and it's hard to imagine it. Got a cat. Still having falls, almost daily mostly afternoon and early evenings. Plan: increase quetiapine  300 mg HS IR.    . 05/23/21 appt noted: also spoke to [facility] there and helping. Wife DM and poor self care and died in middle of night at 72 yo. Problems with sleep.  Still panic at night and afraid to be alone like before. When gets sleepy feels drugged and that gets scary to him.  Will call relatives sisters and sons trying to have someone  come sit with him.   Stay on edge of panic starting about 6 PM. In process of getting MCD so he can get assisted living.  Cannot take care of himself.d Unable to get up in middle of the night for months. Plan: Switch back to quetiapine ER 200 mg for more gradual onset and improve his ability to get up in the middle of the night.  Try to use LED  06/12/21 appt noted:  seen with sister, Jesusita Oka and weak 30 min before coming to visit.  Was late with meds and that might have ben the trigger.  Fear of falling and has fallen a number of times.  Usually uses the walker.   50 yo son moving out next week and pt biggest fear is being by himself. Not sleeping much at night bc panicky when son goes to bed.  Chest heaviness.  Trying to get MCD so he can get into assisted living.  Still working on that. Sort of question what I have to live for bc losses.  No sui intent or plan.  Questions his purpose. Not much to enjoy.  Haven't read a book in 5-6 years bc poor concentration. Plan: Wean clonidine. Disc risk SSRI causing mood swings but few options left to manage anxiety so trial low dose Sertraline 25 mg daily for 1 week, then 50 mg daily.  07/15/2021 appointment with the following noted: with sister Vivien Rota On sertraline 50 mg daily, quetiapine XR 200 mg daily, oxcarbazepine 150 twice daily lithium 300 nightly, lamotrigine 100 mg twice daily Weaned off clonidine. Passed out the other day after getting dizzy. Often feels dizzy.  Several falls lately. Hadn't felt much different with anxiety.  Scary alone at night bc fear of falling.  Gets fear when sun starts going down.   D in law says he's had some paranoia.  She thinks it's worse in the last month.  Neighbor has checked on him that he didn't know.  He had some paranoid thoughts about the neighbor and someone else.  09/30/2021 no show  12/16/2021 appointment noted: With [facility] [place]. For 4 nights stopped been off quetiapine and started Nuplazid. No SE  noticed.   Felt better for a couple of days then not the last couple of days.   Has been having sudden onset sleepiness drugged in afternoon maybe not as strong as it was but keeps him from activity.  Wants to walk outside but can't make himself do it.  Don't like being inside and less active.  This is his biggest complaint. Depression is not worse but ongoing anxiety over the last couple of months.   Sleep most of the night.   Taking lorazepam 1 mg PM only usually.  Not taking much during the day.  Aid says he is taking lorazepam some during the day.   Aid says he intermittently wants to go to the hospital and is erratic in  behavior.  Called EMS 3 times in last week bc weakness and excessive sleepiness.  .   She reports at times he will do things for himself and at times he won't do things for himself.   Still afraid to be alone.  But is often alone.  Calls people to try to get them to stay with him. Anxiety worse as evening approaches. Aid says he perked up when he knew MD was going to call. Denies SI, no SA Current psych meds Nuplazid HS, lamotrigine 100 mg BID,lithium 300 mg HS.  Apparently off Trileptal.  PD followed by Dr. Carles Collet dx about 2016 Vaccinated.  Past Psychiatric Medication Trials: Seroquel XR 300 Nuplazid Trileptal, lamotrigine 100 BID Lithium tremor Klonopin, Ativan Clonidine 0.1 BID Sertraline ? Paranoia.  Review of Systems:  Review of Systems  Constitutional:  Positive for fatigue.  Cardiovascular:  Negative for chest pain and palpitations.  Genitourinary:        Urinary incontinence and using Depends  Musculoskeletal:  Positive for arthralgias, back pain and gait problem.  Neurological:  Positive for dizziness, tremors and weakness. Negative for syncope and speech difficulty.       Balance problems from PD. Falling.  Psychiatric/Behavioral:  Positive for sleep disturbance. Negative for agitation, behavioral problems, confusion, decreased concentration, dysphoric  mood, hallucinations, self-injury and suicidal ideas. The patient is nervous/anxious. The patient is not hyperactive.   Frequent falls are a problem.  Can't pick himself up without help.  Medications: I have reviewed the patient's current medications.  Current Outpatient Medications  Medication Sig Dispense Refill   AMBULATORY NON FORMULARY MEDICATION Upright walker Dx: [name] 1 Device 0   Ascorbic Acid (VITAMIN C) 1000 MG tablet Take 1,000 mg by mouth 2 (two) times daily.     B Complex-C (B-COMPLEX WITH VITAMIN C) tablet Take 1 tablet by mouth daily.     carbidopa-levodopa (SINEMET CR) 50-200 MG tablet TAKE ONE TABLET BY MOUTH EVERY NIGHT AT BEDTIME 90 tablet 1   carbidopa-levodopa (SINEMET IR) 25-100 MG tablet TAKE 2 TABLETS BY MOUTH AT 8AM, 2 TABLETS AT NOON, 2 TABLETS AT 4PM, AND 2 TABLETS AT 8PM 340 tablet 0   Cholecalciferol (VITAMIN D-3) 5000 UNITS TABS Take 1 tablet by mouth daily.     LORazepam (ATIVAN) 2 MG tablet TAKE 1/2 TABLET BY MOUTH EVERY 6 HOURS AS NEEDED FOR ANXIETY 60 tablet 2   losartan (COZAAR) 50 MG tablet Take 50 mg by mouth daily. Pt. Is unsure of Mg.     magnesium oxide (MAG-OX) 400 MG tablet Take 400 mg by mouth daily.     niacin 500 MG tablet Take 500 mg by mouth daily with breakfast.     Omega-3 Fatty Acids (FISH OIL BURP-LESS PO) Take 1 capsule by mouth in the morning and at bedtime.     Opicapone 50 MG CAPS Take 50 mg by mouth daily. 30 capsule 0   Pimavanserin Tartrate (NUPLAZID) 34 MG CAPS Take 1 capsule (34 mg total) by mouth at bedtime. 30 capsule 0   Potassium 99 MG TABS Take 1 tablet by mouth daily.     pramipexole (MIRAPEX) 0.5 MG tablet TAKE ONE TABLET BY MOUTH THREE TIMES A DAY 270 tablet 0   pravastatin (PRAVACHOL) 80 MG tablet Take 80 mg by mouth daily before breakfast.     lamoTRIgine (LAMICTAL) 100 MG tablet Take 1 tablet (100 mg total) by mouth 2 (two) times daily. 60 tablet 1   lithium carbonate (LITHOBID) 300 MG CR tablet  Take 1 tablet (300 mg  total) by mouth at bedtime. 90 tablet 1   No current facility-administered medications for this visit.    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 -"pinkish tan""no odor"   Multiple body piercings    Tremor    RT HAND    Family History  Problem Relation Age of Onset   Leukemia Mother    Healthy Sister    Healthy Sister    Healthy Son    Healthy Son    Healthy Daughter     Social History   Socioeconomic History   Marital status: Widowed    Spouse name: Not on file   Number of children: 3   Years of education: Not on file   Highest education level: 12th grade  Occupational History   Not on file  Tobacco Use   Smoking status: Never   Smokeless tobacco: Never  Vaping Use   Vaping Use: Never used  Substance and Sexual Activity   Alcohol use: No    Alcohol/week: 1.0 standard drink of alcohol    Types: 1 Glasses of wine per week    Comment: no alcohol in 2 years   Drug use: No   Sexual activity: Yes  Other Topics Concern   Not on file  Social History Narrative   Right handed   2 Story home    Lives with spouse and son   Social Determinants of Health   Financial Resource Strain: Not on file  Food Insecurity: Not on file  Transportation Needs: Not on file  Physical Activity: Not on file  Stress: Not on file  Social Connections: Not on file  Intimate Partner Violence: Not on file    Past Medical History, Surgical history, Social history, and Family history were reviewed and updated as appropriate.   Please see review of systems for further details on the patient's review from today.   Objective:   Physical Exam:  There were no vitals taken for this visit.  Physical Exam Neurological:     Mental Status: He is  alert and oriented to person, place, and time.     Cranial Nerves: No dysarthria.  Psychiatric:        Attention and Perception: Attention and perception normal.        Mood and Affect: Mood is anxious.        Speech: Speech normal.        Behavior: Behavior is cooperative.        Thought Content: Thought content normal. Thought content is not paranoid or delusional. Thought content does not include homicidal or suicidal ideation. Thought content does not include suicidal plan.        Cognition and Memory: Memory normal. Cognition is impaired.     Comments: Insight fair and judgment fair to poor Apparently downplaying his sx per the Aid and his son     Lab Review:     Component Value Date/Time   NA 139 12/15/2021 1146   K 3.6 12/15/2021 1146   CL 106 12/15/2021 1146   CO2 25 12/15/2021 1124   GLUCOSE 111 (H) 12/15/2021 1146   BUN 8 12/15/2021 1146   CREATININE 0.60 (L) 12/15/2021 1146  CALCIUM 9.3 12/15/2021 1124   PROT 6.7 12/15/2021 1124   ALBUMIN 4.2 12/15/2021 1124   AST 16 12/15/2021 1124   ALT 6 12/15/2021 1124   ALKPHOS 72 12/15/2021 1124   BILITOT 1.1 12/15/2021 1124   GFRNONAA >60 12/15/2021 1124   GFRAA >90 10/06/2012 1130       Component Value Date/Time   WBC 9.0 12/15/2021 1124   RBC 4.17 (L) 12/15/2021 1124   HGB 13.6 12/15/2021 1146   HCT 40.0 12/15/2021 1146   PLT 211 12/15/2021 1124   MCV 97.4 12/15/2021 1124   MCH 33.3 12/15/2021 1124   MCHC 34.2 12/15/2021 1124   RDW 12.6 12/15/2021 1124   LYMPHSABS 1.0 12/15/2021 1124   MONOABS 0.9 12/15/2021 1124   EOSABS 0.1 12/15/2021 1124   BASOSABS 0.1 12/15/2021 1124    Lithium Lvl  Date Value Ref Range Status  09/30/2021 0.22 (L) 0.60 - 1.20 mmol/L Final    Comment:    Performed at [facility], [place] [address] [place], [place] [address]     No results found for: "PHENYTOIN", "PHENOBARB", "VALPROATE", "CBMZ"   .res Assessment: Plan:    Bipolar I disorder, most recent episode  depressed ([place]) - Plan: lamoTRIgine (LAMICTAL) 100 MG tablet, lithium carbonate (LITHOBID) 300 MG CR tablet  Generalized anxiety disorder  Panic disorder with agoraphobia  Insomnia due to mental condition  Claustrophobia  Mild cognitive impairment   Dependent and help seeking behaviors.  Greater than 50% of 30 non min face to face time with patient was spent on counseling and coordination of care. We discussed Anxiety and insomnia have worsened and remained poorly controlled.  Situation is driving a lot of this bc his fear of being alone.  Couldn't get here bc health issues  Excessive sleepiness most likely related to Sinemet.     Stopped Seroquel and switched to Nuplazid in hopes of less sleepiness.  Give nuplazid more time. Discussed potential metabolic side effects associated with atypical antipsychotics, as well as potential risk for movement side effects. Advised pt to contact office if movement side effects occur.   . Chronic insomnia is worse from anxiety. More panic to unmanageable degree.  More dependence behavior bc of anxiety  Stopped low dose sertraline 50 bc no benefit for anxiety.  lorazepam 2 mg  LED prn 1/2 of 2 mg tablet TID prn   Call if sleep worsens or mania. Hold Trileptal due to DDI concerns  Trying to get by with least meds bc sleepiness and PD  Needs NHP apparently DT advancing PD  Supportive therapy and health direction given severe dx PD and malignant melanoma.  And dealing with family's lack of attention. Disc risk of PD meds affecting psych status including  Risk of PD.  We discussed the short-term risks associated with benzodiazepines including sedation and increased fall risk among others.  Discussed long-term side effect risk including dependence, potential withdrawal symptoms, and the potential eventual dose-related risk of dementia.  But recent studies from 2020 dispute this association between benzodiazepines and dementia risk. Newer studies in  2020 do not support an association with dementia.  He is taking more of this.  Disc costs of Rx and how to manage for ex with [facility] he forgot to look into this and he was reminded about it today.  He was given written information about it.   Patient agrees with this plan.  4 weeks  Lynder Parents, MD, DFAPA     Please see After Visit  Summary for patient specific instructions.  No future appointments.   No orders of the defined types were placed in this encounter.      -------------------------------

## 2021-12-29 ENCOUNTER — Other Ambulatory Visit: Payer: Self-pay | Admitting: Psychiatry

## 2021-12-29 DIAGNOSIS — F3130 Bipolar disorder, current episode depressed, mild or moderate severity, unspecified: Secondary | ICD-10-CM

## 2022-01-02 ENCOUNTER — Ambulatory Visit (INDEPENDENT_AMBULATORY_CARE_PROVIDER_SITE_OTHER): Payer: Medicare Other | Admitting: Psychiatry

## 2022-01-02 ENCOUNTER — Encounter: Payer: Self-pay | Admitting: Psychiatry

## 2022-01-02 DIAGNOSIS — F3130 Bipolar disorder, current episode depressed, mild or moderate severity, unspecified: Secondary | ICD-10-CM | POA: Diagnosis not present

## 2022-01-02 DIAGNOSIS — F40240 Claustrophobia: Secondary | ICD-10-CM | POA: Diagnosis not present

## 2022-01-02 DIAGNOSIS — F411 Generalized anxiety disorder: Secondary | ICD-10-CM

## 2022-01-02 DIAGNOSIS — G20B2 Parkinson's disease with dyskinesia, with fluctuations: Secondary | ICD-10-CM

## 2022-01-02 DIAGNOSIS — F5105 Insomnia due to other mental disorder: Secondary | ICD-10-CM

## 2022-01-02 DIAGNOSIS — G3184 Mild cognitive impairment, so stated: Secondary | ICD-10-CM

## 2022-01-02 DIAGNOSIS — F4001 Agoraphobia with panic disorder: Secondary | ICD-10-CM | POA: Diagnosis not present

## 2022-01-02 MED ORDER — OXCARBAZEPINE 150 MG PO TABS: 150.0000 mg | ORAL_TABLET | Freq: Two times a day (BID) | ORAL | 1 refills | Status: DC

## 2022-01-02 NOTE — Progress Notes (Signed)
Bill Guerrero [id number] 1951-01-17 71 y.o.    Subjective:   Patient ID:  Bill Guerrero is a 71 y.o. (DOB [birth year]) male.  Chief Complaint:  Chief Complaint  Patient presents with   Follow-up    Bipolar I disorder, most recent episode depressed ([place])   Depression   Anxiety    HPI Bill Guerrero presents to the office today for follow-up of mood disorder, anxiety, and chronic insomnia.  He is chronically stressed and down due to the combination of dealing with Parkinson's disease and history of cancer.  seen in march 2021.  No meds were changed. The following was noted: Started playing banjo and taking lessons.  Practices daily.   Stress many things he can no longer do physically.  Can't do housework like before.  Getting him down.  Pushing himself to walk with a walker.  Pain in walking without walker.   Has done PT and finished. Accidentally dropped dose of Trileptal for ? Time frame without a problem to 150 mg daily.  No mood swings noticed.   "Made it longer than they thought I would".  Still in remission with melanoma with follow up tomorrow.   But hard to get around.  Still has bad days emotionally up to a week at a time.  Anxiety is worse than depression.  Easily overwhelmed.   Pattern of EMA at 3 am and then falls asleep watching TV.  Become a habit. Then naps.   Occ forgets Seroquel.  09/18/19 appt with the following noted: Bad day with weakness and PD.  Fell yesterday down 3-4 steps. It's  Getting worse. Not getting better on the banjo.   Started [place].  Interest in music daily.   It's so much fun. Hallucinated on amantadine and fell and stopped. Little Ativan. No current treatment for cancer. Questions about timing of quetiapine and gets dizzy and weak Plan: Consider reducing quetiapine ER to minimize SE but risk increase mood problems. Currently seems to be doing ok with depression and mood swings.  Yes bc he gets up at night, REDUCE quetiapine  to ER 200 mg nightly.  01/18/20 appt with following noted: Asks for jury excuse due to Parkinson's Dz with cognitive impairment, alertness and poor memory. Tries to walk every other day.  Latley more back pain has gotten him really down.  Then can think what's the purpose of life.  His family is not supportive.  Wife and son not very helpful.  Had bad feelings about it.  Frustrated with family.  Down for a couple of weeks.  PD progressed last 6 mos and harder at times to move.   Was falling a lot 6 mos ago, but not in the last 6 mos. No difference with reduction in Seroquel and still feels real hung over in the day. Plan: Apparently no worse with reduction quetiapine to ER 200 mg nightly,  DT drowsiness reduce again to 150 mg ER HS  05/01/2020 appointment with the following noted:  More anxiety, but no worsening manic sx.  Sleep MN to 3 AM and then after a few days sleep more.  Same pattern as 6 mos or longer.  No one else notices any changes with reduction.  He notices no changes with PD.  The off times with PD have been more abrupt.  Takes a long time to do things.  Family just thinks he's slow. No changes with mood. 10 years ago slept 8 hour night.  Not that sleepy during the  day lately. Rare lorazepam. Plan: Reduce oxcarbazepine to 1/2 tablet at night for 2 weeks and if sleep is unchanged then stop it. The goal is to minimize meds that might interfere with Parkinson's medications or increased risk of falling.  06/18/2020 phone call from patient's wife stating mist he was having more anxiety and panic attacks at night and wondering if it was related to med changes. MD response was that it was possible that oxcarbazepine discontinuance could lead to more anxiety or even mania.  He should let us know if he was having any manic symptoms.  Reviewed the purpose of the med change was to minimize drug interactions with Parkinson's disease medications and therefore rather than restarting the oxcarbazepine  we would add lorazepam 0.5 to 1 mg nightly or every 8 hours as needed anxiety.  And to let us know if that failed.  07/02/2020 appointment with the following noted: About every night 6-7 PM get "dreads".  Dreading wife going to bed and him being alone.  To the point of crying and begging wife to stay up with him.  For the last week using it regularly in the afternoon about 2 mg lorazepam.   New problems since stopping the oxcarbazepine.   PD sx are not better off the Trileptal.  Overall sx seems to get a little worse. Satisfied with meds.  No other new concerns.  Rarely uses Ativan except when goes to doctors' offices or with CT scans Dt claustrophobia. Pt reports that mood is Anxious and describes anxiety as Moderate. Anxiety symptoms include: Excessive Worry,. Pt reports has interrupted sleep, not much change off Trileptal. Pt reports that appetite is good. Pt reports that energy is lethargic and no change. Concentration is down slightly. Suicidal thoughts:  denied by patient.  Less dep and anxiety than last visit. Melanoma responded really well to immunotherapy cancer treatment.   Discussed concerns about the cost of Seroquel Exar and how bad he feels if he runs out of the medication. Plan: Apparently no worse with reduction quetiapine to 150 mg ER HS . Continue it. Less drowsiness now  Call if sleep worsens or mania. Restart oxcarbazepine to 1/2 tablet at night for 1 weeks and then 1 each evening..To stop the sense of dread and anxiety in the evening.  08/02/2020 urgent appointment with the following noted: Several recent phone calls complaining of increased anxiety unresolved by attempts to adjust medication over the phone. Worst panic attacks I've ever had, now daily.  Start early afternoon over worry he'll be alone at night bc wife in bed and he's awake.  Crying.  Can get a claustrophobic feeling and fear of not being able to move and see his feet. Near blackout spells. Out of nowhere.   Only  lasts seconds.   Unrealistic fear that he'll die despite positive exams that don't support the fear. Plan: Apparently worse with reduction quetiapine to 150 mg ER HS bc severe anxiety with fear he'll die.  Nothing better with less medication and in fact worse. Increase quetiapine back to ER 200 mg HS. Less drowsiness now  Call if sleep worsens or mania. Increase oxcarbazepine to 1 tablet in AM and  each evening..To stop the sense of dread and anxiety in the evening.   08/09/2020 phone call:  RTC  Couple good days and now anxiety and panic hit and shaking and afraid to be alone tonight.  Scared of big panic attacks and doesn't want to be alone.  1 week ago increased quetiapine  ER 200 HS and oxcarb to 150 mg 1 BID.  Tired and not enough sleep DT panic.  Afraid of being alone. Takes lorazepam prn 2 mg .  Most days taking 2 daily.  Doesn't make him sleepy.  Took it at 4 pm today.    Increase oxcarbazepine to 1 in AM  and 2 at 2 PM and then take 2 mg lorazepam about an hour before anxiety worsens about 3 PM.  Disc Cog techniques.  Lynder Parents, MD, [facility]  09/23/2020 appointment with the following noted: Anxiety worse if anything and especially night.  No sleep since Thursday.  Sleeps in chair bc back pain.  Sleeps downstairs away from wife who goes to bed at 8 pm.  Alone a lot and doesn't like it.   Taking 2 mg lorazepam daily for the last couple of weeks. Takes 20-30 min to kick in. Still some panic but not nightly and serious about once per week. Trouble sleeping bc is anxious and it can hit wiithot reason or build over time.  Usually comes on fast. [facility] started taking him out weekly and that helps. Trying to walk but it's gotten harder.   Rare napping. Jittery feeling in the morning. Plan: Apparently worse with reduction quetiapine to 150 mg ER HS bc severe anxiety with fear he'll die.  Nothing better with less medication and in fact worse. Continue quetiapine back to ER 200 mg HS vs  switch to IR.  Extensive discussion about differences. Yes for better sleep switch to IR.    FALL precautions. Take lorazepam 2 mg every evening at 6 PM. Less drowsiness now  Call if sleep worsens or mania. Continue oxcarbazepine to 1 tablet in AM and  each evening..To stop the sense of dread and anxiety in the evening.  02/24/2021 phone call: Bill Guerrero Pt's wife called reporting Pt is crying and saying he wants to die. Nurse contact with wife: This message was sent as high priority but pt is stable right now. After she gave him 2 of the Lorazepam he did calm down, but remains nervous. Pt request she call. He does have apt Wednesday. Pt's wife reports pt is very anxious and nervous and doesn't like to be left alone in a room or by himself at the house. Very fearful. She reports she stayed home with him today but will be returning to work tomorrow. She reports this has been happening off and on for 2 weeks, pt is sleeping well at night. No other symptoms reported.   I instructed her okay to give lorazepam later this evening or night if needed and sounded like while she is at work he will also need it for being alone. Informed her I would touch base with Dr. Clovis Pu and if he recommended anything prior to apt. She didn't feel there probably was anything.  MD response: Patient is apparently not suicidal just highly distressed.  Agree with lorazepam 2 mg 3 times daily until the appointment in 2 days. May consider clonidine  02/26/2021 appointment with the following noted: Even after Ativan 2 mg anxiety is bad and never been this bad.  Can't stand to be alone, even if wife in the house but sleeping upstairs.  Yesterday beg wife for help.  Afraid he'll die in his sleep. Only Ativan 2 mg TID once.   Kind of drugged feeling for the past few weeks. Taking Seroquel 200 mg HS, lamotrigine 200 mg daily, oxcarbazepine 150 BID, pramipexole 0.5 mg TID Sleep better with  switch to Seroquel IR 200 mg HS Plan: More panic  to unmanageable degree.  More dependence behavior bc of anxiety Disc off label use of clonidine for bipolar and anxiety. Push fluids and fall precautions Clonidine 0.1 mg tablet 1/2 at night for 2 nights, then 1/2 tablet twice daily for 4 nights, then 1/2 in the AM and 1 tablet at night for 4 nights then 1 twice daily Continue quetiapine  200 mg HS IR.   FALL precautions. Take lorazepam 2 mg TID until better with clonidine   03/21/2021 TC  complaining of feeling drugged with a combination of clonidine and lorazepam.  He was encouraged to gradually reduce lorazepam 2 mg tablet to 1/2 tablet 3 times daily and 2 mg at bedtime.  04/18/2021 telephone note: Note RTC [phone].  Started getting depressed again about 4 days ago.  No SI but purposelessness.   04-07-21 neuro started reducing pramipexole to switch bc hypersexual thoughts causing problems with his wife.  Right now depression worse than anxiety.  Doesn't want to be left alone.  Trouble keeping up meds.    Can't tell if clonidine helps anxiety but is taking lorazepam only a couple of times.  Accidentally stopped lamotrigine at some point and doesn't know when. Restart lamotrigine and increase to 100 mg BID  Counseled patient regarding potential benefits, risks, and side effects of Lamictal to include potential risk of Stevens-Johnson syndrome. Advised patient to stop taking Lamictal and contact office immediately if rash develops and to seek urgent medical attention if rash is severe and/or spreading quickly. Will start Lamictal 25 mg daily for 2 weeks, then increase to 50 mg daily for 2 weeks, then 100 mg daily for 2 weeks, then 150 mg daily for mood symptoms.   Add low dose lithium to speed recover and for SI 300 mg daily.  He agrees to the plan  Lynder Parents, MD, [facility]       04/29/2021 appointment with the following noted: seen with sister Not good with more depression the last couple of weeks.  Cry daily.  Tells sister he has SI and  thoughts scare him.  Doesn't think he would do it.  Feels like he's sick and might die.  Still afraid of sleeping alone downstairs. Both scared to die and sometimes wants to die. Taking lihtium 300 mg HS, clonidine 0.1 mg BID, lamotrigine 100 BID, Trileptal 150 BID, quetiapine 200 mg HS, less lorazepam. Looking at asst living facility and it's hard to imagine it. Got a cat. Still having falls, almost daily mostly afternoon and early evenings. Plan: increase quetiapine  300 mg HS IR.    . 05/23/21 appt noted: also spoke to [facility] there and helping. Wife DM and poor self care and died in middle of night at 72 yo. Problems with sleep.  Still panic at night and afraid to be alone like before. When gets sleepy feels drugged and that gets scary to him.  Will call relatives sisters and sons trying to have someone come sit with him.   Stay on edge of panic starting about 6 PM. In process of getting MCD so he can get assisted living.  Cannot take care of himself.d Unable to get up in middle of the night for months. Plan: Switch back to quetiapine ER 200 mg for more gradual onset and improve his ability to get up in the middle of the night.  Try to use LED  06/12/21 appt noted:  seen with sister, Jesusita Oka and  weak 30 min before coming to visit.  Was late with meds and that might have ben the trigger.  Fear of falling and has fallen a number of times.  Usually uses the walker.   60 yo son moving out next week and pt biggest fear is being by himself. Not sleeping much at night bc panicky when son goes to bed.  Chest heaviness.  Trying to get MCD so he can get into assisted living.  Still working on that. Sort of question what I have to live for bc losses.  No sui intent or plan.  Questions his purpose. Not much to enjoy.  Haven't read a book in 5-6 years bc poor concentration. Plan: Wean clonidine. Disc risk SSRI causing mood swings but few options left to manage anxiety so trial low  dose Sertraline 25 mg daily for 1 week, then 50 mg daily.  07/15/2021 appointment with the following noted: with sister Vivien Rota On sertraline 50 mg daily, quetiapine XR 200 mg daily, oxcarbazepine 150 twice daily lithium 300 nightly, lamotrigine 100 mg twice daily Weaned off clonidine. Passed out the other day after getting dizzy. Often feels dizzy.  Several falls lately. Hadn't felt much different with anxiety.  Scary alone at night bc fear of falling.  Gets fear when sun starts going down.   D in law says he's had some paranoia.  She thinks it's worse in the last month.  Neighbor has checked on him that he didn't know.  He had some paranoid thoughts about the neighbor and someone else.  09/30/2021 no show  12/16/2021 appointment noted: With Aid [facility]o. For 4 nights stopped been off quetiapine and started Nuplazid. No SE noticed.   Felt better for a couple of days then not the last couple of days.   Has been having sudden onset sleepiness drugged in afternoon maybe not as strong as it was but keeps him from activity.  Wants to walk outside but can't make himself do it.  Don't like being inside and less active.  This is his biggest complaint. Depression is not worse but ongoing anxiety over the last couple of months.   Sleep most of the night.   Taking lorazepam 1 mg PM only usually.  Not taking much during the day.  Aid says he is taking lorazepam some during the day.   Aid says he intermittently wants to go to the hospital and is erratic in behavior.  Called EMS 3 times in last week bc weakness and excessive sleepiness.  .   She reports at times he will do things for himself and at times he won't do things for himself.   Still afraid to be alone.  But is often alone.  Calls people to try to get them to stay with him. Anxiety worse as evening approaches. Aid says he perked up when he knew MD was going to call. Denies SI, no SA Current psych meds Nuplazid HS, lamotrigine 100 mg BID,lithium 300 mg  HS.  Apparently off Trileptal. Plan: Stopped Seroquel and switched to Nuplazid in hopes of less sleepiness.  Give nuplazid more time.  01/02/22 appt noted:  with son, emergent work in appt On Nuplazid about 2 weeks. Also on lithium 300, lorazepam 1 mg TID-QID, lamotrigine 100 mg daily, oxcarbazepine 150 BID.  Off Seroquel.   A change in CBA-LVO dose recently too. Not a lot of difference but a couple of good days at the beginning of the day.  But then later in day  is sleepy and can't function the rest of the day. Got very constipated and took ExLax. Has gone to PT and thinks he's doing well witgh that.   Pretty well adjusted where he is now.  Likes it pretty well and likes the people and they like him, but may have to move to asst living and dreads it.  Has a cat.  Cat can interfere with sleep and will have to get rid of it. Can go from walking around the building to [facility] within an hour.   Still has panic and some change in type of worry.  Still can get scared before dark if alone.  Will get really sad then also and will still cry at night   PD followed by Dr. Carles Collet dx about 2016 Vaccinated.  Past Psychiatric Medication Trials:  Seroquel XR 300 Nuplazid Trileptal, lamotrigine 100 BID Lithium tremor Klonopin, Ativan Clonidine 0.1 BID Sertraline ? Paranoia; 50 NR  Review of Systems:  Review of Systems  Constitutional:  Positive for fatigue.  Cardiovascular:  Negative for chest pain and palpitations.  Genitourinary:        Urinary incontinence and using Depends  Musculoskeletal:  Positive for arthralgias, back pain and gait problem.  Neurological:  Positive for dizziness, tremors and weakness. Negative for syncope and speech difficulty.       Balance problems from PD. Falling.  Psychiatric/Behavioral:  Positive for dysphoric mood and sleep disturbance. Negative for agitation, behavioral problems, confusion, decreased concentration, hallucinations, self-injury and suicidal ideas. The  patient is nervous/anxious. The patient is not hyperactive.   Frequent falls are a problem.  Can't pick himself up without help.  Medications: I have reviewed the patient's current medications.  Current Outpatient Medications  Medication Sig Dispense Refill   AMBULATORY NON FORMULARY MEDICATION Upright walker Dx: [name] 1 Device 0   carbidopa-levodopa (SINEMET CR) 50-200 MG tablet TAKE ONE TABLET BY MOUTH EVERY NIGHT AT BEDTIME 90 tablet 1   carbidopa-levodopa (SINEMET IR) 25-100 MG tablet TAKE 2 TABLETS BY MOUTH AT 8AM, 2 TABLETS AT NOON, 2 TABLETS AT 4PM, AND 2 TABLETS AT 8PM 340 tablet 0   Cholecalciferol (VITAMIN D-3) 5000 UNITS TABS Take 1 tablet by mouth daily.     lamoTRIgine (LAMICTAL) 100 MG tablet Take 1 tablet (100 mg total) by mouth 2 (two) times daily. 60 tablet 1   lithium carbonate (LITHOBID) 300 MG CR tablet Take 1 tablet (300 mg total) by mouth at bedtime. 90 tablet 1   LORazepam (ATIVAN) 2 MG tablet TAKE 1/2 TABLET BY MOUTH EVERY 6 HOURS AS NEEDED FOR ANXIETY (Patient taking differently: 1/2 tab daily) 60 tablet 2   losartan (COZAAR) 50 MG tablet Take 50 mg by mouth daily. Pt. Is unsure of Mg.     Pimavanserin Tartrate (NUPLAZID) 34 MG CAPS Take 1 capsule (34 mg total) by mouth at bedtime. 30 capsule 0   Ascorbic Acid (VITAMIN C) 1000 MG tablet Take 1,000 mg by mouth 2 (two) times daily. (Patient not taking: Reported on 01/02/2022)     B Complex-C (B-COMPLEX WITH VITAMIN C) tablet Take 1 tablet by mouth daily. (Patient not taking: Reported on 01/02/2022)     magnesium oxide (MAG-OX) 400 MG tablet Take 400 mg by mouth daily. (Patient not taking: Reported on 01/02/2022)     niacin 500 MG tablet Take 500 mg by mouth daily with breakfast. (Patient not taking: Reported on 01/02/2022)     Omega-3 Fatty Acids (FISH OIL BURP-LESS PO) Take 1 capsule by mouth  in the morning and at bedtime. (Patient not taking: Reported on 01/02/2022)     Opicapone 50 MG CAPS Take 50 mg by mouth daily. (Patient  not taking: Reported on 01/02/2022) 30 capsule 0   OXcarbazepine (TRILEPTAL) 150 MG tablet Take 1 tablet (150 mg total) by mouth 2 (two) times daily. 60 tablet 1   Potassium 99 MG TABS Take 1 tablet by mouth daily. (Patient not taking: Reported on 01/02/2022)     pramipexole (MIRAPEX) 0.5 MG tablet TAKE ONE TABLET BY MOUTH THREE TIMES A DAY (Patient not taking: Reported on 01/02/2022) 270 tablet 0   pravastatin (PRAVACHOL) 80 MG tablet Take 80 mg by mouth daily before breakfast. (Patient not taking: Reported on 01/02/2022)     No current facility-administered medications for this visit.    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 -"pinkish tan""no odor"   Multiple body piercings    Tremor    RT HAND    Family History  Problem Relation Age of Onset   Leukemia Mother    Healthy Sister    Healthy Sister    Healthy Son    Healthy Son    Healthy Daughter     Social History   Socioeconomic History   Marital status: Widowed    Spouse name: Not on file   Number of children: 3   Years of education: Not on file   Highest education level: 12th grade  Occupational History   Not on file  Tobacco Use   Smoking status: Never   Smokeless tobacco: Never  Vaping Use   Vaping Use: Never used  Substance and Sexual Activity   Alcohol use: No    Alcohol/week: 1.0 standard drink of alcohol    Types: 1 Glasses of wine per week    Comment: no alcohol in 2 years   Drug use: No   Sexual activity: Yes  Other Topics Concern   Not on file  Social History Narrative   Right handed   2 Story home    Lives with spouse and son   Social Determinants of Health   Financial Resource Strain: Not on file  Food Insecurity: Not on file  Transportation Needs:  Not on file  Physical Activity: Not on file  Stress: Not on file  Social Connections: Not on file  Intimate Partner Violence: Not on file    Past Medical History, Surgical history, Social history, and Family history were reviewed and updated as appropriate.   Please see review of systems for further details on the patient's review from today.   Objective:   Physical Exam:  There were no vitals taken for this visit.  Physical Exam Neurological:     Mental Status: He is alert and oriented to person, place, and time.     Cranial Nerves: No dysarthria.     Motor: Weakness and tremor present.     Coordination: Coordination abnormal.     Gait: Gait abnormal.     Comments: Fidgety with left leg  Psychiatric:        Attention and Perception: Attention and perception normal.        Mood and Affect: Mood is anxious and depressed.  Speech: Speech normal. Speech is not rapid and pressured or slurred.        Behavior: Behavior is not agitated or slowed. Behavior is cooperative.        Thought Content: Thought content normal. Thought content is not paranoid or delusional. Thought content does not include homicidal or suicidal ideation. Thought content does not include suicidal plan.        Cognition and Memory: Memory normal. Cognition is impaired.     Comments: Insight fair and judgment fair to poor      Lab Review:     Component Value Date/Time   NA 139 12/15/2021 1146   K 3.6 12/15/2021 1146   CL 106 12/15/2021 1146   CO2 25 12/15/2021 1124   GLUCOSE 111 (H) 12/15/2021 1146   BUN 8 12/15/2021 1146   CREATININE 0.60 (L) 12/15/2021 1146   CALCIUM 9.3 12/15/2021 1124   PROT 6.7 12/15/2021 1124   ALBUMIN 4.2 12/15/2021 1124   AST 16 12/15/2021 1124   ALT 6 12/15/2021 1124   ALKPHOS 72 12/15/2021 1124   BILITOT 1.1 12/15/2021 1124   GFRNONAA >60 12/15/2021 1124   GFRAA >90 10/06/2012 1130       Component Value Date/Time   WBC 9.0 12/15/2021 1124   RBC 4.17 (L)  12/15/2021 1124   HGB 13.6 12/15/2021 1146   HCT 40.0 12/15/2021 1146   PLT 211 12/15/2021 1124   MCV 97.4 12/15/2021 1124   MCH 33.3 12/15/2021 1124   MCHC 34.2 12/15/2021 1124   RDW 12.6 12/15/2021 1124   LYMPHSABS 1.0 12/15/2021 1124   MONOABS 0.9 12/15/2021 1124   EOSABS 0.1 12/15/2021 1124   BASOSABS 0.1 12/15/2021 1124    Lithium Lvl  Date Value Ref Range Status  09/30/2021 0.22 (L) 0.60 - 1.20 mmol/L Final    Comment:    Performed at [facility], [place] [address], [place], [place] [address]     No results found for: "PHENYTOIN", "PHENOBARB", "VALPROATE", "CBMZ"   .res Assessment: Plan:    Bipolar I disorder, most recent episode depressed ([place]) - Plan: OXcarbazepine (TRILEPTAL) 150 MG tablet  Generalized anxiety disorder  Panic disorder with agoraphobia  Claustrophobia  Mild cognitive impairment  Insomnia due to mental condition  Parkinson's disease with dyskinesia and fluctuating manifestations   Dependent and help seeking behaviors.  Greater than 50% of 30 non min face to face time with patient was spent on counseling and coordination of care. We discussed Anxiety and insomnia have worsened and remained poorly controlled.  Situation is driving a lot of this bc his fear of being alone.   Excessive sleepiness most likely related to Sinemet.     Stopped Seroquel and switched to Nuplazid in hopes of less sleepiness.  Give nuplazid more time.  Discussed potential metabolic side effects associated with atypical antipsychotics, as well as potential risk for movement side effects. Have tried to stop Seroquel in hopes of less sleepiness and less risk of dystonia  Chronic insomnia is worse from anxiety. More panic to unmanageable degree.  More dependence behavior bc of anxiety  lorazepam 2 mg  LED prn 1/2 of 2 mg tablet TID prn   Call if sleep worsens or mania. They did not stop oxcarbazepine as previously disc, still taking 150 BID  Needs  asst living.  Cont PT  Supportive therapy and health direction given severe dx PD and malignant melanoma.  And dealing with family's lack of attention. Disc risk of PD meds affecting  psych status including  Risk of PD.  We discussed the short-term risks associated with benzodiazepines including sedation and increased fall risk among others.  Discussed long-term side effect risk including dependence, potential withdrawal symptoms, and the potential eventual dose-related risk of dementia.  But recent studies from 2020 dispute this association between benzodiazepines and dementia risk. Newer studies in 2020 do not support an association with dementia.  He is taking more of this.  Disc costs of Rx and how to manage for ex with [employer] he forgot to look into this and he was reminded about it today.  He was given written information about it.   Patient agrees with this plan.  4 weeks  Lynder Parents, MD, DFAPA     Please see After Visit Summary for patient specific instructions.  Future Appointments  Date Time Provider [place]  03/04/2022 11:00 AM [name], [name], MD CP-CP None     No orders of the defined types were placed in this encounter.      -------------------------------

## 2022-01-05 ENCOUNTER — Telehealth: Payer: Self-pay | Admitting: Psychiatry

## 2022-01-05 NOTE — Telephone Encounter (Signed)
Where should the Nuplazid RX go?

## 2022-01-05 NOTE — Telephone Encounter (Signed)
Please review.Is there a certain pharmacy that you know of for the nuplazid rx ?

## 2022-01-05 NOTE — Telephone Encounter (Signed)
Son, Harrell Gave, called today at 10:10 to request more samples of the Nuplazid.  He will be out of them tolday.  I did pull a box of samples for him. He has not heard back from [place] about to pick up a prescription for this medication. He was not able to fill it at [facility] because they couldn't get the medication.  He was told we would call him to let him know which pharmacy had the medication and that we would send a prescription to that pharmacy.  He has not heard back from [place]. Please advice him about this.

## 2022-01-07 NOTE — Telephone Encounter (Signed)
Son stated he has medicare and united healthcare

## 2022-01-07 NOTE — Telephone Encounter (Signed)
Did he get insurance?

## 2022-01-07 NOTE — Telephone Encounter (Signed)
Pt has medicare for his visits is what I was told but his pharmacy is not covered. I believe Dr. Clovis Pu discussed this with him at last visit? Unless something has changed?

## 2022-01-08 NOTE — Telephone Encounter (Signed)
Working on an option for pt to receive his Nuplazid. Contacted his Medicare A and B and they confirmed he only has medical coverage since 02/2017. It shows he has a supplement/AARP with [employer] but when contacted they show no coverage for anything with pharmacy benefits.   Reviewing options of pt. Assistance. Will contact son with further information.  Also need to make sure pt is seeing progress with medication with no side effects, he may need to stay on samples longer to check with the benefit of the medication.

## 2022-01-08 NOTE — Telephone Encounter (Signed)
Pls get the name of the Nuplazid rep from admin staff and call that person to see if they have a grant program or pt assistance for this patient to get Nuplazid.  He does not have a pharm benefits provider.

## 2022-01-09 NOTE — Telephone Encounter (Signed)
We already have pt assistance for pt. See other phone message.

## 2022-01-09 NOTE — Telephone Encounter (Signed)
Pt Assistance is an option but need to wait until pt is seeing progress before we get pt/family to sign forms and complete. Will update son with information.

## 2022-01-13 NOTE — Telephone Encounter (Signed)
Bill Guerrero has called the rep to get more samples. Until we see how patient responds to the medication she is holding patient assistance paperwork. Will contact patient/son to see how he is doing.

## 2022-01-13 NOTE — Telephone Encounter (Signed)
Leda Gauze said she has the form for the patient/son to apply for patient assistance but wants to be sure that the medication is working for him before completing the process. She will contact rep for more samples.

## 2022-01-14 NOTE — Telephone Encounter (Signed)
Tried calling patient to see how he was doing on the Nuplazid. His VM is full. Called son and son said he wasn't sure, but didn't think he was any better or any worse. Son said patient called him this morning and said he has been too weak the last 2 days to get up from his chair. Son thinks this may be a progression of his Parkinson's, "which patient doesn't want to hear". Patient was getting PT, but son doesn't know if he still is. Pulled more samples of Nuplazid.  He has F/U 1/31.

## 2022-01-15 NOTE — Telephone Encounter (Signed)
Rep says if we fill out the "Nuplazid form"  and send that then his med should get covered at no charge.  We can wait on this a little while to hear back from the family as to how he is doing.

## 2022-01-21 ENCOUNTER — Telehealth: Payer: Self-pay | Admitting: Psychiatry

## 2022-01-21 NOTE — Telephone Encounter (Signed)
At this point I do not think it likely that his psychiatric medications or the cause of this sleepiness.  He is likely that it is the Parkinson's medications.  However we will continue to try to reduce any psychiatric medications to see if it makes a difference.  Have him stop the oxcarbazepine and do not exceed lorazepam 1 mg 3 times daily.  Call us after Christmas and let us know how he is doing.

## 2022-01-21 NOTE — Telephone Encounter (Signed)
Hridaan's health care giver, Hinton Dyer, called with Bill Guerrero and reported that Bill Guerrero is very sleepy all the time and he is now so weak that he can walk.  His legs are so weak.  He stays in his chair 22 hrs.  When he is sleeping, he is not resting. He can't even get up to go to the bathroom, he is so weak.  This has been going on for a while.  Stopped the Seroquel thinking that was part of the problem, and discontinued other medication and added the Nuplazid but nothing has changed.  He is desperate for help with this.  Please call with advise.  Please call Dana's # [phone].  She will be with him and he can listen and participate in the conversation as he is able.  Appt 1/31

## 2022-01-21 NOTE — Telephone Encounter (Signed)
Please see message. I talked with son recently when I was unable to reach patient and son thinks sx are a progression of his Parkinson's, but son said patient doesn't want to hear it. Patient was getting some PT, but son is unsure if he is still getting it.

## 2022-01-22 NOTE — Telephone Encounter (Signed)
Hinton Dyer notified of recommendations.

## 2022-02-06 ENCOUNTER — Telehealth: Payer: Self-pay | Admitting: Psychiatry

## 2022-02-06 NOTE — Telephone Encounter (Signed)
Called patient and his mailbox is full.  Put a note in the bag with samples for patient to contact us and let us know how he is doing with the medication.

## 2022-02-06 NOTE — Telephone Encounter (Signed)
Pt's son LVM @ 2:26p  He has POA.  Wants to get the refills of the samples we have been providing to his dad (he didn't name the medication), other than to say they can't find it anywhere.  Next appt 1/31

## 2022-02-06 NOTE — Telephone Encounter (Signed)
Called son and asked if he had felt patient was seeing any benefit. Son said he didn't think so, but that if I asked the patient he might tell me differently. It is difficult to get in touch with the patient, but I will call him to get his feedback. I pulled what samples we had available (4 weeks) and let son know. If patient feels like he is getting benefit paperwork can be done for assistance.

## 2022-02-21 ENCOUNTER — Other Ambulatory Visit: Payer: Self-pay | Admitting: Psychiatry

## 2022-02-21 DIAGNOSIS — F3130 Bipolar disorder, current episode depressed, mild or moderate severity, unspecified: Secondary | ICD-10-CM

## 2022-02-21 DIAGNOSIS — F4001 Agoraphobia with panic disorder: Secondary | ICD-10-CM

## 2022-02-25 ENCOUNTER — Telehealth: Payer: Self-pay | Admitting: Psychiatry

## 2022-02-25 NOTE — Telephone Encounter (Signed)
Pended.

## 2022-02-25 NOTE — Telephone Encounter (Signed)
Requesting refill on the following medications. Lamotrigine and Ativan. Pharmacy is:  [facility] [id number] - [place] [address], [place]   Phone: [phone]  Fax: [phone]

## 2022-02-25 NOTE — Telephone Encounter (Signed)
Filled 10/10 appt 1/31

## 2022-03-04 ENCOUNTER — Ambulatory Visit: Payer: Medicare Other | Admitting: Psychiatry

## 2022-03-31 ENCOUNTER — Other Ambulatory Visit: Payer: Self-pay | Admitting: Psychiatry

## 2022-03-31 DIAGNOSIS — F4001 Agoraphobia with panic disorder: Secondary | ICD-10-CM

## 2022-04-02 ENCOUNTER — Ambulatory Visit: Payer: Medicare Other | Admitting: Psychiatry

## 2022-04-30 ENCOUNTER — Other Ambulatory Visit: Payer: Self-pay | Admitting: Psychiatry

## 2022-04-30 DIAGNOSIS — F3130 Bipolar disorder, current episode depressed, mild or moderate severity, unspecified: Secondary | ICD-10-CM

## 2022-04-30 DIAGNOSIS — F4001 Agoraphobia with panic disorder: Secondary | ICD-10-CM

## 2022-05-14 ENCOUNTER — Ambulatory Visit: Payer: Medicare Other | Admitting: Psychiatry

## 2022-06-03 ENCOUNTER — Other Ambulatory Visit: Payer: Self-pay | Admitting: Psychiatry

## 2022-06-03 DIAGNOSIS — F4001 Agoraphobia with panic disorder: Secondary | ICD-10-CM

## 2022-06-03 DIAGNOSIS — F3130 Bipolar disorder, current episode depressed, mild or moderate severity, unspecified: Secondary | ICD-10-CM

## 2022-06-19 ENCOUNTER — Emergency Department (HOSPITAL_COMMUNITY): Payer: Medicare Other

## 2022-06-19 ENCOUNTER — Emergency Department (HOSPITAL_COMMUNITY)
Admission: EM | Admit: 2022-06-19 | Discharge: 2022-06-20 | Disposition: A | Discharge status: Home / Self Care | Payer: Medicare Other | Attending: Emergency Medicine | Admitting: Emergency Medicine

## 2022-06-19 ENCOUNTER — Encounter (HOSPITAL_COMMUNITY): Payer: Self-pay

## 2022-06-19 ENCOUNTER — Other Ambulatory Visit: Payer: Self-pay

## 2022-06-19 DIAGNOSIS — M25521 Pain in right elbow: Secondary | ICD-10-CM | POA: Diagnosis not present

## 2022-06-19 DIAGNOSIS — Z79899 Other long term (current) drug therapy: Secondary | ICD-10-CM | POA: Diagnosis not present

## 2022-06-19 DIAGNOSIS — I10 Essential (primary) hypertension: Secondary | ICD-10-CM | POA: Diagnosis not present

## 2022-06-19 DIAGNOSIS — M25511 Pain in right shoulder: Secondary | ICD-10-CM | POA: Insufficient documentation

## 2022-06-19 DIAGNOSIS — K8020 Calculus of gallbladder without cholecystitis without obstruction: Secondary | ICD-10-CM | POA: Diagnosis not present

## 2022-06-19 DIAGNOSIS — Y92410 Unspecified street and highway as the place of occurrence of the external cause: Secondary | ICD-10-CM | POA: Diagnosis not present

## 2022-06-19 DIAGNOSIS — Z23 Encounter for immunization: Secondary | ICD-10-CM | POA: Diagnosis not present

## 2022-06-19 DIAGNOSIS — G20C Parkinsonism, unspecified: Secondary | ICD-10-CM | POA: Diagnosis not present

## 2022-06-19 DIAGNOSIS — I700 Atherosclerosis of aorta: Secondary | ICD-10-CM | POA: Insufficient documentation

## 2022-06-19 DIAGNOSIS — M545 Low back pain, unspecified: Secondary | ICD-10-CM | POA: Diagnosis present

## 2022-06-19 DIAGNOSIS — M79601 Pain in right arm: Secondary | ICD-10-CM | POA: Insufficient documentation

## 2022-06-19 DIAGNOSIS — Y93I9 Activity, other involving external motion: Secondary | ICD-10-CM | POA: Insufficient documentation

## 2022-06-19 LAB — CBC
HCT: 40 % (ref 39.0–52.0)
Hemoglobin: 13.3 g/dL (ref 13.0–17.0)
MCH: 31.7 pg (ref 26.0–34.0)
MCHC: 33.3 g/dL (ref 30.0–36.0)
MCV: 95.5 fL (ref 80.0–100.0)
Platelets: 218 10*3/uL (ref 150–400)
RBC: 4.19 MIL/uL — ABNORMAL LOW (ref 4.22–5.81)
RDW: 13.1 % (ref 11.5–15.5)
WBC: 10.5 10*3/uL (ref 4.0–10.5)
nRBC: 0 % (ref 0.0–0.2)

## 2022-06-19 LAB — COMPREHENSIVE METABOLIC PANEL
ALT: 6 U/L (ref 0–44)
AST: 15 U/L (ref 15–41)
Albumin: 4 g/dL (ref 3.5–5.0)
Alkaline Phosphatase: 92 U/L (ref 38–126)
Anion gap: 13 (ref 5–15)
BUN: 14 mg/dL (ref 8–23)
CO2: 21 mmol/L — ABNORMAL LOW (ref 22–32)
Calcium: 9.2 mg/dL (ref 8.9–10.3)
Chloride: 103 mmol/L (ref 98–111)
Creatinine, Ser: 0.7 mg/dL (ref 0.61–1.24)
GFR, Estimated: >60 mL/min (ref >60)
Glucose, Bld: 108 mg/dL — ABNORMAL HIGH (ref 70–99)
Potassium: 3.5 mmol/L (ref 3.5–5.1)
Sodium: 137 mmol/L (ref 135–145)
Total Bilirubin: 0.7 mg/dL (ref 0.3–1.2)
Total Protein: 6.6 g/dL (ref 6.5–8.1)

## 2022-06-19 LAB — SAMPLE TO BLOOD BANK

## 2022-06-19 MED ORDER — CARBIDOPA-LEVODOPA ER 50-200 MG PO TBCR: 1.0000 | EXTENDED_RELEASE_TABLET | Freq: Every day | ORAL | Status: DC

## 2022-06-19 MED ORDER — CARBIDOPA-LEVODOPA ER 50-200 MG PO TBCR
1.0000 | EXTENDED_RELEASE_TABLET | Freq: Once | ORAL | Status: AC
  Administered 2022-06-19: 1 via ORAL
  Filled 2022-06-19: qty 1

## 2022-06-19 MED ORDER — TETANUS-DIPHTH-ACELL PERTUSSIS 5-2.5-18.5 LF-MCG/0.5 IM SUSY
0.5000 mL | PREFILLED_SYRINGE | Freq: Once | INTRAMUSCULAR | Status: AC
  Administered 2022-06-19: 0.5 mL via INTRAMUSCULAR
  Filled 2022-06-19: qty 0.5

## 2022-06-19 MED ORDER — IOHEXOL 350 MG/ML SOLN
75.0000 mL | Freq: Once | INTRAVENOUS | Status: AC | PRN
  Administered 2022-06-19: 75 mL via INTRAVENOUS

## 2022-06-19 MED ORDER — FENTANYL CITRATE PF 50 MCG/ML IJ SOSY
50.0000 ug | PREFILLED_SYRINGE | Freq: Once | INTRAMUSCULAR | Status: AC
  Administered 2022-06-19: 50 ug via INTRAVENOUS
  Filled 2022-06-19: qty 1
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
## 2022-06-20 NOTE — ED Notes (Signed)
Ptar called 

## 2022-06-20 NOTE — Discharge Instructions (Signed)
No significant traumatic injuries were found on your workup tonight.  Please follow-up with your regular doctor.
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
## 2022-06-22 LAB — CBG MONITORING, ED: Glucose-Capillary: 95 mg/dL (ref 70–99)

## 2022-06-30 ENCOUNTER — Ambulatory Visit: Payer: Medicare Other | Admitting: Psychiatry

## 2022-07-01 NOTE — Progress Notes (Signed)
No show

## 2022-07-06 ENCOUNTER — Other Ambulatory Visit: Payer: Self-pay | Admitting: Psychiatry

## 2022-07-06 DIAGNOSIS — F3130 Bipolar disorder, current episode depressed, mild or moderate severity, unspecified: Secondary | ICD-10-CM

## 2022-07-06 DIAGNOSIS — F4001 Agoraphobia with panic disorder: Secondary | ICD-10-CM

## 2022-07-31 ENCOUNTER — Telehealth: Payer: Self-pay | Admitting: Psychiatry

## 2022-07-31 NOTE — Telephone Encounter (Signed)
Danielle at [facility] called at 2:10p requesting the med list for this pt.  She said she is transferring meds from another pharmacy and wants to make sure she has them all so they can pack them up and send them to him.  No upcoming appt scheduled.

## 2022-07-31 NOTE — Telephone Encounter (Signed)
Fax number [phone]

## 2022-08-03 NOTE — Telephone Encounter (Signed)
Faxed med list to pharmacy, but patient is past due for FU, has not been seen by [place] since December.

## 2022-08-11 ENCOUNTER — Other Ambulatory Visit: Payer: Self-pay | Admitting: Psychiatry

## 2022-08-11 DIAGNOSIS — F3130 Bipolar disorder, current episode depressed, mild or moderate severity, unspecified: Secondary | ICD-10-CM

## 2022-08-11 NOTE — Telephone Encounter (Signed)
He is ill with Parkinson's .  Ok to schedule virtual.  He NS at last appt.  Call to [facility]

## 2022-08-14 NOTE — Telephone Encounter (Signed)
Patient scheduled 8/26

## 2022-08-17 ENCOUNTER — Other Ambulatory Visit: Payer: Self-pay | Admitting: Psychiatry

## 2022-08-17 DIAGNOSIS — F3130 Bipolar disorder, current episode depressed, mild or moderate severity, unspecified: Secondary | ICD-10-CM

## 2022-08-20 NOTE — Telephone Encounter (Signed)
Clarisse Gouge, can you try to close this encounter?  It says I don't have access to do it (so I assume Ebonee does not either)

## 2022-09-11 ENCOUNTER — Other Ambulatory Visit: Payer: Self-pay | Admitting: Psychiatry

## 2022-09-11 DIAGNOSIS — F3130 Bipolar disorder, current episode depressed, mild or moderate severity, unspecified: Secondary | ICD-10-CM

## 2022-09-22 ENCOUNTER — Emergency Department (HOSPITAL_BASED_OUTPATIENT_CLINIC_OR_DEPARTMENT_OTHER)
Admission: EM | Admit: 2022-09-22 | Discharge: 2022-09-22 | Disposition: A | Discharge status: Home / Self Care | Payer: Medicare Other | Attending: Emergency Medicine | Admitting: Emergency Medicine

## 2022-09-22 ENCOUNTER — Encounter (HOSPITAL_BASED_OUTPATIENT_CLINIC_OR_DEPARTMENT_OTHER): Payer: Self-pay

## 2022-09-22 ENCOUNTER — Other Ambulatory Visit: Payer: Self-pay

## 2022-09-22 DIAGNOSIS — R42 Dizziness and giddiness: Secondary | ICD-10-CM | POA: Insufficient documentation

## 2022-09-22 DIAGNOSIS — I10 Essential (primary) hypertension: Secondary | ICD-10-CM | POA: Insufficient documentation

## 2022-09-22 DIAGNOSIS — G20C Parkinsonism, unspecified: Secondary | ICD-10-CM | POA: Diagnosis not present

## 2022-09-22 DIAGNOSIS — K0889 Other specified disorders of teeth and supporting structures: Secondary | ICD-10-CM | POA: Diagnosis not present

## 2022-09-22 DIAGNOSIS — Z79899 Other long term (current) drug therapy: Secondary | ICD-10-CM | POA: Insufficient documentation

## 2022-09-22 DIAGNOSIS — H1031 Unspecified acute conjunctivitis, right eye: Secondary | ICD-10-CM | POA: Diagnosis not present

## 2022-09-22 DIAGNOSIS — R519 Headache, unspecified: Secondary | ICD-10-CM | POA: Diagnosis present

## 2022-09-22 DIAGNOSIS — B029 Zoster without complications: Secondary | ICD-10-CM | POA: Insufficient documentation

## 2022-09-22 DIAGNOSIS — L0100 Impetigo, unspecified: Secondary | ICD-10-CM

## 2022-09-22 MED ORDER — OXYCODONE HCL 5 MG PO TABS
5.0000 mg | ORAL_TABLET | Freq: Four times a day (QID) | ORAL | 0 refills | Status: DC | PRN
Start: 2022-09-22 — End: 2022-09-28

## 2022-09-22 MED ORDER — TETRACAINE HCL 0.5 % OP SOLN
2.0000 [drp] | Freq: Once | OPHTHALMIC | Status: AC
  Administered 2022-09-22: 2 [drp] via OPHTHALMIC
  Filled 2022-09-22: qty 4

## 2022-09-22 MED ORDER — MUPIROCIN CALCIUM 2 % EX CREA: 1.0000 | TOPICAL_CREAM | Freq: Two times a day (BID) | CUTANEOUS | 0 refills | Status: AC

## 2022-09-22 MED ORDER — IBUPROFEN 400 MG PO TABS
600.0000 mg | ORAL_TABLET | Freq: Once | ORAL | Status: AC
  Administered 2022-09-22: 600 mg via ORAL
  Filled 2022-09-22: qty 1

## 2022-09-22 MED ORDER — OXYCODONE-ACETAMINOPHEN 5-325 MG PO TABS
1.0000 | ORAL_TABLET | Freq: Once | ORAL | Status: AC
  Administered 2022-09-22: 1 via ORAL
  Filled 2022-09-22: qty 1

## 2022-09-22 MED ORDER — FLUORESCEIN SODIUM 1 MG OP STRP
1.0000 | ORAL_STRIP | Freq: Once | OPHTHALMIC | Status: AC
  Administered 2022-09-22: 1 via OPHTHALMIC
  Filled 2022-09-22: qty 1

## 2022-09-22 MED ORDER — MUPIROCIN CALCIUM 2 % EX CREA: 1.0000 | TOPICAL_CREAM | Freq: Two times a day (BID) | CUTANEOUS | 0 refills | Status: DC

## 2022-09-22 MED ORDER — VALACYCLOVIR HCL 500 MG PO TABS
1000.0000 mg | ORAL_TABLET | Freq: Once | ORAL | Status: AC
  Administered 2022-09-22: 1000 mg via ORAL
  Filled 2022-09-22: qty 2

## 2022-09-22 MED ORDER — VALACYCLOVIR HCL 1 G PO TABS: 1000.0000 mg | ORAL_TABLET | Freq: Three times a day (TID) | ORAL | 0 refills | Status: DC

## 2022-09-22 MED ORDER — VALACYCLOVIR HCL 1 G PO TABS: 1000.0000 mg | ORAL_TABLET | Freq: Three times a day (TID) | ORAL | 0 refills | Status: AC

## 2022-09-22 MED ORDER — OXYCODONE HCL 5 MG PO TABS: 5.0000 mg | ORAL_TABLET | Freq: Four times a day (QID) | ORAL | 0 refills | Status: DC | PRN

## 2022-09-22 MED ORDER — VALACYCLOVIR HCL 500 MG PO TABS: 500.0000 mg | ORAL_TABLET | Freq: Once | ORAL | Status: DC

## 2022-09-22 NOTE — Discharge Instructions (Signed)
You were seen in the emergency department for your facial pain and your rash.  It appears that you likely have shingles and may have a component of infection called impetigo.  I have given you an antiviral that you should take as prescribed as well as a topical antibiotic cream that you can apply.  You can take Tylenol and Motrin every 6 hours as needed for pain and I given you oxycodone to take for breakthrough pain.  You should follow-up with your primary doctor in the next few days to have your symptoms rechecked.  You should return to the emergency department if you have significantly worsening eye pain or vision loss, fevers or any other new or concerning symptoms.

## 2022-09-22 NOTE — ED Triage Notes (Signed)
Pt to ED via [facility] from [place] c/o right side dental pain x 4 days, progressively getting worse. Today noticed swelling to right side face.  Last VS: 150/90, hr 64, 96%RA  No medications given by EMS. Hx Parkinson

## 2022-09-22 NOTE — ED Notes (Signed)
Pt discharge instructions reviewed with pt and with son, La Cabreros. Pt and son verbalized understanding of instructions, prescription and medication review, pain management, and follow-up care. Pt son will be transporting pt back to his facility. Pt CAOx4 and in NAD at time of discharge.

## 2022-09-22 NOTE — ED Provider Notes (Signed)
[place] EMERGENCY DEPARTMENT AT [facility] Provider Note   CSN: [id number] Arrival date & time: 09/22/22  1006     History  Chief Complaint  Patient presents with   Dental Pain    Bill Guerrero is a 72 y.o. male.  Patient is a 72 year old male with a past medical history of Parkinson's and hypertension presenting to the emergency department with right sided tooth and facial pain.  The patient states that a few days ago he initially noticed some pain in his right lower molars and then started to have pain in his right upper molars.  He states of the last 2 days he started to feel pain around his right temple and right eye and when he woke up this morning his right eye was crusted shut.  He denies any pain in his eyeball or foreign body sensation.  He denies any vision changes.  He denies any headache or fever.  He denies any drainage or swelling in his mouth.  The history is provided by the patient.  Dental Pain      Home Medications Prior to Admission medications   Medication Sig Start Date End Date Taking? Authorizing Provider  mupirocin cream (BACTROBAN) 2 % Apply 1 Application topically 2 (two) times daily. 09/22/22  Yes Theresia Lo, Benetta Spar K, DO  oxyCODONE (ROXICODONE) 5 MG immediate release tablet Take 1 tablet (5 mg total) by mouth every 6 (six) hours as needed. 09/22/22  Yes Theresia Lo, Benetta Spar K, DO  valACYclovir (VALTREX) 1000 MG tablet Take 1 tablet (1,000 mg total) by mouth 3 (three) times daily. 09/22/22  Yes Kingsley, Benetta Spar K, DO  AMBULATORY NON FORMULARY MEDICATION Upright walker Dx: [name] 10/27/19   [name], Octaviano Batty, DO  Ascorbic Acid (VITAMIN C) 1000 MG tablet Take 1,000 mg by mouth 2 (two) times daily. Patient not taking: Reported on 01/02/2022    [provider]  B Complex-C (B-COMPLEX WITH VITAMIN C) tablet Take 1 tablet by mouth daily. Patient not taking: Reported on 01/02/2022    [provider]  carbidopa-levodopa (SINEMET CR)  50-200 MG tablet TAKE ONE TABLET BY MOUTH EVERY NIGHT AT BEDTIME 12/12/20   [name], Rebecca S, DO  carbidopa-levodopa (SINEMET IR) 25-100 MG tablet TAKE 2 TABLETS BY MOUTH AT 8AM, 2 TABLETS AT NOON, 2 TABLETS AT 4PM, AND 2 TABLETS AT 8PM 04/28/21   [name], Octaviano Batty, DO  Cholecalciferol (VITAMIN D-3) 5000 UNITS TABS Take 1 tablet by mouth daily.    [provider]  lamoTRIgine (LAMICTAL) 100 MG tablet TAKE 1 TABLET BY MOUTH 2 TIMES A DAY 07/06/22   [name], Steva Ready., MD  lithium carbonate (LITHOBID) 300 MG ER tablet TAKE 1 TABLET BY MOUTH AT BEDTIME 09/13/22   [name], Steva Ready., MD  LORazepam (ATIVAN) 2 MG tablet TAKE ONE HALF TABLET BY MOUTH EVERY 8 HOURS AS NEEDED FOR ANXIETY 07/06/22   [name], Steva Ready., MD  losartan (COZAAR) 50 MG tablet Take 50 mg by mouth daily. Pt. Is unsure of Mg.    [provider]  magnesium oxide (MAG-OX) 400 MG tablet Take 400 mg by mouth daily. Patient not taking: Reported on 01/02/2022    [provider]  niacin 500 MG tablet Take 500 mg by mouth daily with breakfast. Patient not taking: Reported on 01/02/2022    [provider]  Omega-3 Fatty Acids (FISH OIL BURP-LESS PO) Take 1 capsule by mouth in the morning and at bedtime. Patient not taking: Reported on 01/02/2022  [provider]  Opicapone 50 MG CAPS Take 50 mg by mouth daily. Patient not taking: Reported on 01/02/2022 12/03/20   [name], Octaviano Batty, DO  OXcarbazepine (TRILEPTAL) 150 MG tablet TAKE 1 TABLET BY MOUTH 2 TIMES DAILY 08/11/22   [name], Steva Ready., MD  Pimavanserin Tartrate (NUPLAZID) 34 MG CAPS Take 1 capsule (34 mg total) by mouth at bedtime. 12/11/21   [name], Steva Ready., MD  Potassium 99 MG TABS Take 1 tablet by mouth daily. Patient not taking: Reported on 01/02/2022    [provider]  pramipexole (MIRAPEX) 0.5 MG tablet TAKE ONE TABLET BY MOUTH THREE TIMES A DAY Patient not taking: Reported on 01/02/2022 04/03/21   [name], Octaviano Batty, DO  pravastatin  (PRAVACHOL) 80 MG tablet Take 80 mg by mouth daily before breakfast. Patient not taking: Reported on 01/02/2022    [provider]  QUEtiapine (SEROQUEL XR) 200 MG 24 hr tablet TAKE 1 TABLET BY MOUTH AT BEDTIME 08/11/22   [name], Steva Ready., MD      Allergies    Patient has no known allergies.    Review of Systems   Review of Systems  Physical Exam Updated Vital Signs BP 136/83 (BP Location: Right Arm)   Pulse 73   Temp 97.9 F (36.6 C) (Oral)   Resp 18   Ht 6\' 2"  (1.88 m)   Wt 104.3 kg   SpO2 98%   BMI 29.53 kg/m  Physical Exam Vitals and nursing note reviewed.  Constitutional:      General: He is not in acute distress.    Appearance: Normal appearance.  HENT:     Head: Normocephalic and atraumatic.      Nose: Nose normal.     Mouth/Throat:     Mouth: Mucous membranes are moist.     Pharynx: Oropharynx is clear.      Comments: No trismus, no submandibular swelling, no tongue swelling, tolerating secretions, normal phonation Eyes:     Extraocular Movements: Extraocular movements intact.     Pupils: Pupils are equal, round, and reactive to light.     Comments: Mild R eye conjunctival injection with small amount of eye drainage Fluorescein staining negative bilaterally  IOP 12 on R, 11 on L  Cardiovascular:     Rate and Rhythm: Normal rate and regular rhythm.     Heart sounds: Normal heart sounds.  Pulmonary:     Effort: Pulmonary effort is normal.     Breath sounds: Normal breath sounds.  Abdominal:     General: Abdomen is flat.  Musculoskeletal:        General: Normal range of motion.     Cervical back: Normal range of motion and neck supple.  Lymphadenopathy:     Cervical: Cervical adenopathy (on right) present.  Skin:    General: Skin is warm and dry.  Neurological:     Mental Status: He is alert and oriented to person, place, and time. Mental status is at baseline.  Psychiatric:        Mood and Affect: Mood normal.        Behavior: Behavior  normal.     ED Results / Procedures / Treatments   Labs (all labs ordered are listed, but only abnormal results are displayed) Labs Reviewed - No data to display  EKG None  Radiology No results found.  Procedures Procedures    Medications Ordered in ED Medications  valACYclovir (VALTREX) tablet 1,000 mg (has no administration in time range)  ibuprofen (ADVIL) tablet 600 mg (600 mg Oral Given 09/22/22 1136)  oxyCODONE-acetaminophen (PERCOCET/ROXICET) 5-325 MG per tablet 1 tablet (1 tablet Oral Given 09/22/22 1136)  fluorescein ophthalmic strip 1 strip (1 strip Both Eyes Given by Other 09/22/22 1139)  tetracaine (PONTOCAINE) 0.5 % ophthalmic solution 2 drop (2 drops Both Eyes Given by Other 09/22/22 1138)    ED Course/ Medical Decision Making/ A&P                                 Medical Decision Making This patient presents to the ED with chief complaint(s) of R tooth/facial pain with pertinent past medical history of Parkinson's, hypertension which further complicates the presenting complaint. The complaint involves an extensive differential diagnosis and also carries with it a high risk of complications and morbidity.    The differential diagnosis includes mild dental tenderness concerning for possible dental abscess though no palpable fluctuance or swelling and multiple tender teeth making this less likely, does have a rash in the face concerning for possible shingles versus impetigo, conjunctivitis, ocular shingles  Additional history obtained: Additional history obtained from family Records reviewed Care Everywhere/External Records  ED Course and Reassessment: On patient's arrival he is hemodynamically stable in no acute distress.  He did have some conjunctival injection on the right with a blistering appearing rash to his right side of his cheek concerning for possible shingles.  He did have fluorescein staining that showed no ocular involvement, acuity was normal and IOP's  were normal.  He had mild dental tenderness to percussion but no palpable fluctuance and no obvious dental abscess.  Concern more for shingles versus impetigo and will be started on valacyclovir.  He was given pain control and was recommended close primary care follow-up.  Independent labs interpretation:  N/A  Independent visualization of imaging: - N/A  Consultation: - Consulted or discussed management/test interpretation w/ external professional: N/A  Consideration for admission or further workup: Patient has no emergent conditions requiring admission or further work-up at this time and is stable for discharge home with primary care follow-up  Social Determinants of health: N/A    Risk Prescription drug management.          Final Clinical Impression(s) / ED Diagnoses Final diagnoses:  Herpes zoster without complication  Impetigo    Rx / DC Orders ED Discharge Orders          Ordered    valACYclovir (VALTREX) 1000 MG tablet  3 times daily        09/22/22 1320    oxyCODONE (ROXICODONE) 5 MG immediate release tablet  Every 6 hours PRN        09/22/22 1320    mupirocin cream (BACTROBAN) 2 %  2 times daily        09/22/22 1320              Raven, [place] K, DO 09/22/22 1321

## 2022-09-28 ENCOUNTER — Ambulatory Visit (INDEPENDENT_AMBULATORY_CARE_PROVIDER_SITE_OTHER): Payer: Medicare Other | Admitting: Psychiatry

## 2022-09-28 ENCOUNTER — Encounter: Payer: Self-pay | Admitting: Psychiatry

## 2022-09-28 DIAGNOSIS — F4001 Agoraphobia with panic disorder: Secondary | ICD-10-CM

## 2022-09-28 DIAGNOSIS — G3184 Mild cognitive impairment, so stated: Secondary | ICD-10-CM

## 2022-09-28 DIAGNOSIS — F40240 Claustrophobia: Secondary | ICD-10-CM

## 2022-09-28 DIAGNOSIS — F411 Generalized anxiety disorder: Secondary | ICD-10-CM | POA: Diagnosis not present

## 2022-09-28 DIAGNOSIS — G20B2 Parkinson's disease with dyskinesia, with fluctuations: Secondary | ICD-10-CM

## 2022-09-28 DIAGNOSIS — F3130 Bipolar disorder, current episode depressed, mild or moderate severity, unspecified: Secondary | ICD-10-CM | POA: Diagnosis not present

## 2022-09-28 DIAGNOSIS — F5105 Insomnia due to other mental disorder: Secondary | ICD-10-CM

## 2022-09-28 MED ORDER — ESCITALOPRAM OXALATE 5 MG PO TABS
5.0000 mg | ORAL_TABLET | Freq: Every day | ORAL | 1 refills | Status: DC
Start: 2022-09-28 — End: 2022-12-04

## 2022-09-28 NOTE — Progress Notes (Signed)
Bill Guerrero [id number] 11/26/1950 72 y.o.  Virtual Visit via Telephone Note  I connected with pt by telephone and verified that I am speaking with the correct person using two identifiers.   I discussed the limitations, risks, security and privacy concerns of performing an evaluation and management service by telephone and the availability of in person appointments. I also discussed with the patient that there may be a patient responsible charge related to this service. The patient expressed understanding and agreed to proceed.  I discussed the assessment and treatment plan with the patient. The patient was provided an opportunity to ask questions and all were answered. The patient agreed with the plan and demonstrated an understanding of the instructions.   The patient was advised to call back or seek an in-person evaluation if the symptoms worsen or if the condition fails to improve as anticipated.  I provided 30 minutes of non-face-to-face time during this encounter. The call started at 200 and ended at 230. The patient was located at home DT immobility and the provider was located office.   Subjective:   Patient ID:  Bill Guerrero is a 72 y.o. (DOB 01/31/1951) male.  Chief Complaint:  Chief Complaint  Patient presents with   Follow-up   Depression   Anxiety   Sleeping Problem    HPI Bill Guerrero presents to the office today for follow-up of mood disorder, anxiety, and chronic insomnia.  He is chronically stressed and down due to the combination of dealing with Parkinson's disease and history of cancer.  seen in march 2021.  No meds were changed. The following was noted: Started playing banjo and taking lessons.  Practices daily.   Stress many things he can no longer do physically.  Can't do housework like before.  Getting him down.  Pushing himself to walk with a walker.  Pain in walking without walker.   Has done PT and finished. Accidentally dropped dose  of Trileptal for ? Time frame without a problem to 150 mg daily.  No mood swings noticed.   "Made it longer than they thought I would".  Still in remission with melanoma with follow up tomorrow.   But hard to get around.  Still has bad days emotionally up to a week at a time.  Anxiety is worse than depression.  Easily overwhelmed.   Pattern of EMA at 3 am and then falls asleep watching TV.  Become a habit. Then naps.   Occ forgets Seroquel.  09/18/19 appt with the following noted: Bad day with weakness and PD.  Fell yesterday down 3-4 steps. It's  Getting worse. Not getting better on the banjo.   Started [place].  Interest in music daily.   It's so much fun. Hallucinated on amantadine and fell and stopped. Little Ativan. No current treatment for cancer. Questions about timing of quetiapine and gets dizzy and weak Plan: Consider reducing quetiapine ER to minimize SE but risk increase mood problems. Currently seems to be doing ok with depression and mood swings.  Yes bc he gets up at night, REDUCE quetiapine to ER 200 mg nightly.  01/18/20 appt with following noted: Asks for jury excuse due to Parkinson's Dz with cognitive impairment, alertness and poor memory. Tries to walk every other day.  Latley more back pain has gotten him really down.  Then can think what's the purpose of life.  His family is not supportive.  Wife and son not very helpful.  Had bad feelings about it.  Frustrated with family.  Down for a couple of weeks.  PD progressed last 6 mos and harder at times to move.   Was falling a lot 6 mos ago, but not in the last 6 mos. No difference with reduction in Seroquel and still feels real hung over in the day. Plan: Apparently no worse with reduction quetiapine to ER 200 mg nightly,  DT drowsiness reduce again to 150 mg ER HS  05/01/2020 appointment with the following noted:  More anxiety, but no worsening manic sx.  Sleep MN to 3 AM and then after a few days sleep more.  Same  pattern as 6 mos or longer.  No one else notices any changes with reduction.  He notices no changes with PD.  The off times with PD have been more abrupt.  Takes a long time to do things.  Family just thinks he's slow. No changes with mood. 10 years ago slept 8 hour night.  Not that sleepy during the day lately. Rare lorazepam. Plan: Reduce oxcarbazepine to 1/2 tablet at night for 2 weeks and if sleep is unchanged then stop it. The goal is to minimize meds that might interfere with Parkinson's medications or increased risk of falling.  06/18/2020 phone call from patient's wife stating mist he was having more anxiety and panic attacks at night and wondering if it was related to med changes. MD response was that it was possible that oxcarbazepine discontinuance could lead to more anxiety or even mania.  He should let us know if he was having any manic symptoms.  Reviewed the purpose of the med change was to minimize drug interactions with Parkinson's disease medications and therefore rather than restarting the oxcarbazepine we would add lorazepam 0.5 to 1 mg nightly or every 8 hours as needed anxiety.  And to let us know if that failed.  07/02/2020 appointment with the following noted: About every night 6-7 PM get "dreads".  Dreading wife going to bed and him being alone.  To the point of crying and begging wife to stay up with him.  For the last week using it regularly in the afternoon about 2 mg lorazepam.   New problems since stopping the oxcarbazepine.   PD sx are not better off the Trileptal.  Overall sx seems to get a little worse. Satisfied with meds.  No other new concerns.  Rarely uses Ativan except when goes to doctors' offices or with CT scans Dt claustrophobia. Pt reports that mood is Anxious and describes anxiety as Moderate. Anxiety symptoms include: Excessive Worry,. Pt reports has interrupted sleep, not much change off Trileptal. Pt reports that appetite is good. Pt reports that energy is  lethargic and no change. Concentration is down slightly. Suicidal thoughts:  denied by patient.  Less dep and anxiety than last visit. Melanoma responded really well to immunotherapy cancer treatment.   Discussed concerns about the cost of Seroquel Exar and how bad he feels if he runs out of the medication. Plan: Apparently no worse with reduction quetiapine to 150 mg ER HS . Continue it. Less drowsiness now  Call if sleep worsens or mania. Restart oxcarbazepine to 1/2 tablet at night for 1 weeks and then 1 each evening..To stop the sense of dread and anxiety in the evening.  08/02/2020 urgent appointment with the following noted: Several recent phone calls complaining of increased anxiety unresolved by attempts to adjust medication over the phone. Worst panic attacks I've ever had, now daily.  Start early afternoon over  worry he'll be alone at night bc wife in bed and he's awake.  Crying.  Can get a claustrophobic feeling and fear of not being able to move and see his feet. Near blackout spells. Out of nowhere.   Only lasts seconds.   Unrealistic fear that he'll die despite positive exams that don't support the fear. Plan: Apparently worse with reduction quetiapine to 150 mg ER HS bc severe anxiety with fear he'll die.  Nothing better with less medication and in fact worse. Increase quetiapine back to ER 200 mg HS. Less drowsiness now  Call if sleep worsens or mania. Increase oxcarbazepine to 1 tablet in AM and  each evening..To stop the sense of dread and anxiety in the evening.   08/09/2020 phone call:  RTC  Couple good days and now anxiety and panic hit and shaking and afraid to be alone tonight.  Scared of big panic attacks and doesn't want to be alone.  1 week ago increased quetiapine ER 200 HS and oxcarb to 150 mg 1 BID.  Tired and not enough sleep DT panic.  Afraid of being alone. Takes lorazepam prn 2 mg .  Most days taking 2 daily.  Doesn't make him sleepy.  Took it at 4 pm today.     Increase oxcarbazepine to 1 in AM  and 2 at 2 PM and then take 2 mg lorazepam about an hour before anxiety worsens about 3 PM.  Disc Cog techniques.  Bill Staggers, MD, [facility]  09/23/2020 appointment with the following noted: Anxiety worse if anything and especially night.  No sleep since Thursday.  Sleeps in chair bc back pain.  Sleeps downstairs away from wife who goes to bed at 8 pm.  Alone a lot and doesn't like it.   Taking 2 mg lorazepam daily for the last couple of weeks. Takes 20-30 min to kick in. Still some panic but not nightly and serious about once per week. Trouble sleeping bc is anxious and it can hit wiithot reason or build over time.  Usually comes on fast. [facility] started taking him out weekly and that helps. Trying to walk but it's gotten harder.   Rare napping. Jittery feeling in the morning. Plan: Apparently worse with reduction quetiapine to 150 mg ER HS bc severe anxiety with fear he'll die.  Nothing better with less medication and in fact worse. Continue quetiapine back to ER 200 mg HS vs switch to IR.  Extensive discussion about differences. Yes for better sleep switch to IR.    FALL precautions. Take lorazepam 2 mg every evening at 6 PM. Less drowsiness now  Call if sleep worsens or mania. Continue oxcarbazepine to 1 tablet in AM and  each evening..To stop the sense of dread and anxiety in the evening.  02/24/2021 phone call: [facility] Pt's wife called reporting Pt is crying and saying he wants to die. Nurse contact with wife: This message was sent as high priority but pt is stable right now. After she gave him 2 of the Lorazepam he did calm down, but remains nervous. Pt request she call. He does have apt Wednesday. Pt's wife reports pt is very anxious and nervous and doesn't like to be left alone in a room or by himself at the house. Very fearful. She reports she stayed home with him today but will be returning to work tomorrow. She reports this has been happening off  and on for 2 weeks, pt is sleeping well at night. No other symptoms  reported.   I instructed her okay to give lorazepam later this evening or night if needed and sounded like while she is at work he will also need it for being alone. Informed her I would touch base with Dr. Jennelle Human and if he recommended anything prior to apt. She didn't feel there probably was anything.  MD response: Patient is apparently not suicidal just highly distressed.  Agree with lorazepam 2 mg 3 times daily until the appointment in 2 days. May consider clonidine  02/26/2021 appointment with the following noted: Even after Ativan 2 mg anxiety is bad and never been this bad.  Can't stand to be alone, even if wife in the house but sleeping upstairs.  Yesterday beg wife for help.  Afraid he'll die in his sleep. Only Ativan 2 mg TID once.   Kind of drugged feeling for the past few weeks. Taking Seroquel 200 mg HS, lamotrigine 200 mg daily, oxcarbazepine 150 BID, pramipexole 0.5 mg TID Sleep better with switch to Seroquel IR 200 mg HS Plan: More panic to unmanageable degree.  More dependence behavior bc of anxiety Disc off label use of clonidine for bipolar and anxiety. Push fluids and fall precautions Clonidine 0.1 mg tablet 1/2 at night for 2 nights, then 1/2 tablet twice daily for 4 nights, then 1/2 in the AM and 1 tablet at night for 4 nights then 1 twice daily Continue quetiapine  200 mg HS IR.   FALL precautions. Take lorazepam 2 mg TID until better with clonidine   03/21/2021 TC  complaining of feeling drugged with a combination of clonidine and lorazepam.  He was encouraged to gradually reduce lorazepam 2 mg tablet to 1/2 tablet 3 times daily and 2 mg at bedtime.  04/18/2021 telephone note: Note RTC [phone].  Started getting depressed again about 4 days ago.  No SI but purposelessness.   04-07-21 neuro started reducing pramipexole to switch bc hypersexual thoughts causing problems with his wife.  Right now depression  worse than anxiety.  Doesn't want to be left alone.  Trouble keeping up meds.    Can't tell if clonidine helps anxiety but is taking lorazepam only a couple of times.  Accidentally stopped lamotrigine at some point and doesn't know when. Restart lamotrigine and increase to 100 mg BID  Counseled patient regarding potential benefits, risks, and side effects of Lamictal to include potential risk of Stevens-Johnson syndrome. Advised patient to stop taking Lamictal and contact office immediately if rash develops and to seek urgent medical attention if rash is severe and/or spreading quickly. Will start Lamictal 25 mg daily for 2 weeks, then increase to 50 mg daily for 2 weeks, then 100 mg daily for 2 weeks, then 150 mg daily for mood symptoms.   Add low dose lithium to speed recover and for SI 300 mg daily.  He agrees to the plan  Bill Staggers, MD, [facility]       04/29/2021 appointment with the following noted: seen with sister Not good with more depression the last couple of weeks.  Cry daily.  Tells sister he has SI and thoughts scare him.  Doesn't think he would do it.  Feels like he's sick and might die.  Still afraid of sleeping alone downstairs. Both scared to die and sometimes wants to die. Taking lihtium 300 mg HS, clonidine 0.1 mg BID, lamotrigine 100 BID, Trileptal 150 BID, quetiapine 200 mg HS, less lorazepam. Looking at asst living facility and it's hard to imagine it. Got a cat.  Still having falls, almost daily mostly afternoon and early evenings. Plan: increase quetiapine  300 mg HS IR.    . 05/23/21 appt noted: also spoke to [facility] there and helping. Wife DM and poor self care and died in middle of night at 72 yo. Problems with sleep.  Still panic at night and afraid to be alone like before. When gets sleepy feels drugged and that gets scary to him.  Will call relatives sisters and sons trying to have someone come sit with him.   Stay on edge of panic starting about 6 PM. In process  of getting MCD so he can get assisted living.  Cannot take care of himself.d Unable to get up in middle of the night for months. Plan: Switch back to quetiapine ER 200 mg for more gradual onset and improve his ability to get up in the middle of the night.  Try to use LED  06/12/21 appt noted:  seen with sister, Nicholaus Corolla and weak 30 min before coming to visit.  Was late with meds and that might have ben the trigger.  Fear of falling and has fallen a number of times.  Usually uses the walker.   46 yo son moving out next week and pt biggest fear is being by himself. Not sleeping much at night bc panicky when son goes to bed.  Chest heaviness.  Trying to get MCD so he can get into assisted living.  Still working on that. Sort of question what I have to live for bc losses.  No sui intent or plan.  Questions his purpose. Not much to enjoy.  Haven't read a book in 5-6 years bc poor concentration. Plan: Wean clonidine. Disc risk SSRI causing mood swings but few options left to manage anxiety so trial low dose Sertraline 25 mg daily for 1 week, then 50 mg daily.  07/15/2021 appointment with the following noted: with sister Sheralyn Boatman On sertraline 50 mg daily, quetiapine XR 200 mg daily, oxcarbazepine 150 twice daily lithium 300 nightly, lamotrigine 100 mg twice daily Weaned off clonidine. Passed out the other day after getting dizzy. Often feels dizzy.  Several falls lately. Hadn't felt much different with anxiety.  Scary alone at night bc fear of falling.  Gets fear when sun starts going down.   D in law says he's had some paranoia.  She thinks it's worse in the last month.  Neighbor has checked on him that he didn't know.  He had some paranoid thoughts about the neighbor and someone else.  09/30/2021 no show  12/16/2021 appointment noted: With [facility] [place]. For 4 nights stopped been off quetiapine and started Nuplazid. No SE noticed.   Felt better for a couple of days then not the last couple of days.    Has been having sudden onset sleepiness drugged in afternoon maybe not as strong as it was but keeps him from activity.  Wants to walk outside but can't make himself do it.  Don't like being inside and less active.  This is his biggest complaint. Depression is not worse but ongoing anxiety over the last couple of months.   Sleep most of the night.   Taking lorazepam 1 mg PM only usually.  Not taking much during the day.  Aid says he is taking lorazepam some during the day.   Aid says he intermittently wants to go to the hospital and is erratic in behavior.  Called EMS 3 times in last week bc weakness and excessive  sleepiness.  .   She reports at times he will do things for himself and at times he won't do things for himself.   Still afraid to be alone.  But is often alone.  Calls people to try to get them to stay with him. Anxiety worse as evening approaches. Aid says he perked up when he knew MD was going to call. Denies SI, no SA Current psych meds Nuplazid HS, lamotrigine 100 mg BID,lithium 300 mg HS.  Apparently off Trileptal. Plan: Stopped Seroquel and switched to Nuplazid in hopes of less sleepiness.  Give nuplazid more time.  01/02/22 appt noted:  with son, emergent work in appt On Nuplazid about 2 weeks. Also on lithium 300, lorazepam 1 mg TID-QID, lamotrigine 100 mg daily, oxcarbazepine 150 BID.  Off Seroquel.   A change in CBA-LVO dose recently too. Not a lot of difference but a couple of good days at the beginning of the day.  But then later in day is sleepy and can't function the rest of the day. Got very constipated and took ExLax. Has gone to PT and thinks he's doing well witgh that.   Pretty well adjusted where he is now.  Likes it pretty well and likes the people and they like him, but may have to move to asst living and dreads it.  Has a cat.  Cat can interfere with sleep and will have to get rid of it. Can go from walking around the building to [facility] within an hour.   Still has  panic and some change in type of worry.  Still can get scared before dark if alone.  Will get really sad then also and will still cry at night  06/30/22 no show  09/28/22 appt noted: Going through hard time.  Confined to lift chair 22 hours daily.   Gets real anxious sundown.  Scared being by myself.  When awakens it takes an hour to get going.   Legs weak with short steps and freezing.   Anxiety is worse than depression.   Taking naps.   Caregiver administers meds.  He's not sure what he is taking.  Sister comes twice weekly.  Meds apparently : lamotrigine 100 BID, Trileptal 150 BID, lorazepam 2 mg 1/2 BID. He can't really go out of the house now DT PD.   In a retirement community.  Doesn't get any help with health.  I need additional help.   Dx shingles lately on face with pain.   Some STM.    PD followed by Dr. Arbutus Leas dx about 2016 Vaccinated.  Past Psychiatric Medication Trials:  Seroquel XR 300 Nuplazid Trileptal, lamotrigine 100 BID Lithium tremor Klonopin Ativan Clonidine 0.1 BID Sertraline ? Paranoia; 50 NR  Review of Systems:  Review of Systems  Constitutional:  Positive for fatigue.  Cardiovascular:  Negative for chest pain and palpitations.  Genitourinary:        Urinary incontinence and using Depends  Musculoskeletal:  Positive for arthralgias, back pain and gait problem.  Neurological:  Positive for dizziness, tremors and weakness. Negative for syncope and speech difficulty.       Balance problems from PD. Falling.  Psychiatric/Behavioral:  Positive for dysphoric mood and sleep disturbance. Negative for agitation, behavioral problems, confusion, decreased concentration, hallucinations, self-injury and suicidal ideas. The patient is nervous/anxious. The patient is not hyperactive.   Frequent falls are a problem.  Can't pick himself up without help.  Medications: I have reviewed the patient's current medications.  Current Outpatient  Medications  Medication Sig  Dispense Refill   AMBULATORY NON FORMULARY MEDICATION Upright walker Dx: [name] 1 Device 0   Ascorbic Acid (VITAMIN C) 1000 MG tablet Take 1,000 mg by mouth 2 (two) times daily.     B Complex-C (B-COMPLEX WITH VITAMIN C) tablet Take 1 tablet by mouth daily.     carbidopa-levodopa (SINEMET CR) 50-200 MG tablet TAKE ONE TABLET BY MOUTH EVERY NIGHT AT BEDTIME 90 tablet 1   carbidopa-levodopa (SINEMET IR) 25-100 MG tablet TAKE 2 TABLETS BY MOUTH AT 8AM, 2 TABLETS AT NOON, 2 TABLETS AT 4PM, AND 2 TABLETS AT 8PM 340 tablet 0   Cholecalciferol (VITAMIN D-3) 5000 UNITS TABS Take 1 tablet by mouth daily.     escitalopram (LEXAPRO) 5 MG tablet Take 1 tablet (5 mg total) by mouth daily. 30 tablet 1   lamoTRIgine (LAMICTAL) 100 MG tablet TAKE 1 TABLET BY MOUTH 2 TIMES A DAY 60 tablet 2   lithium carbonate (LITHOBID) 300 MG ER tablet TAKE 1 TABLET BY MOUTH AT BEDTIME 90 tablet 0   LORazepam (ATIVAN) 2 MG tablet TAKE ONE HALF TABLET BY MOUTH EVERY 8 HOURS AS NEEDED FOR ANXIETY 45 tablet 2   losartan (COZAAR) 50 MG tablet Take 50 mg by mouth daily. Pt. Is unsure of Mg.     magnesium oxide (MAG-OX) 400 MG tablet Take 400 mg by mouth daily.     mupirocin cream (BACTROBAN) 2 % Apply 1 Application topically 2 (two) times daily. 15 g 0   niacin 500 MG tablet Take 500 mg by mouth daily with breakfast.     Omega-3 Fatty Acids (FISH OIL BURP-LESS PO) Take 1 capsule by mouth in the morning and at bedtime.     Opicapone 50 MG CAPS Take 50 mg by mouth daily. 30 capsule 0   OXcarbazepine (TRILEPTAL) 150 MG tablet TAKE 1 TABLET BY MOUTH 2 TIMES DAILY 60 tablet 0   Pimavanserin Tartrate (NUPLAZID) 34 MG CAPS Take 1 capsule (34 mg total) by mouth at bedtime. 30 capsule 0   Potassium 99 MG TABS Take 1 tablet by mouth daily.     pramipexole (MIRAPEX) 0.5 MG tablet TAKE ONE TABLET BY MOUTH THREE TIMES A DAY 270 tablet 0   pravastatin (PRAVACHOL) 80 MG tablet Take 80 mg by mouth daily before breakfast.     valACYclovir (VALTREX)  1000 MG tablet Take 1 tablet (1,000 mg total) by mouth 3 (three) times daily. 21 tablet 0   No current facility-administered medications for this visit.    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 -"pinkish tan""no odor"   Multiple body piercings    Tremor    RT HAND    Family History  Problem Relation Age of Onset   Leukemia Mother    Healthy Sister    Healthy Sister    Healthy Son    Healthy Son    Healthy Daughter     Social History   Socioeconomic History   Marital status: Widowed    Spouse name: Not on file   Number of children: 3   Years of education: Not on file   Highest education level: 12th grade  Occupational History   Not on file  Tobacco Use   Smoking status: Never   Smokeless tobacco: Never  Vaping Use   Vaping status: Never Used  Substance and Sexual Activity   Alcohol use: No    Alcohol/week: 1.0 standard drink of alcohol    Types: 1 Glasses of wine per week    Comment: no alcohol in 2 years   Drug use: No   Sexual activity: Yes  Other Topics Concern   Not on file  Social History Narrative   Right handed   2 Story home    Lives with spouse and son   Social Determinants of Health   Financial Resource Strain: Not on file  Food Insecurity: No Food Insecurity (09/16/2021)   Received from [facility], [facility]   Hunger Vital Sign    Worried About Running Out of Food in the Last Year: Never true    Ran Out of Food in the Last Year: Never true  Transportation Needs: Not on file  Physical Activity: Not on file  Stress: Not on file  Social Connections: Unknown (08/29/2021)   Received from [facility], [facility]   Social Network    Social Network: Not on file  Intimate Partner Violence:  Unknown (08/29/2021)   Received from [facility], [facility]   HITS    Physically Hurt: Not on file    Insult or Talk Down To: Not on file    Threaten Physical Harm: Not on file    Scream or Curse: Not on file    Past Medical History, Surgical history, Social history, and Family history were reviewed and updated as appropriate.   Please see review of systems for further details on the patient's review from today.   Objective:   Physical Exam:  There were no vitals taken for this visit.  Physical Exam Neurological:     Mental Status: He is alert and oriented to person, place, and time.     Cranial Nerves: No dysarthria.     Motor: Weakness and tremor present.     Coordination: Coordination abnormal.     Gait: Gait abnormal.     Comments: Fidgety with left leg  Psychiatric:        Attention and Perception: Attention and perception normal.        Mood and Affect: Mood is anxious and depressed.        Speech: Speech normal. Speech is not rapid and pressured or slurred.        Behavior: Behavior is not agitated or slowed. Behavior is cooperative.        Thought Content: Thought content normal. Thought content is not paranoid or delusional. Thought content does not include homicidal or suicidal ideation. Thought content does not include suicidal plan.        Cognition and Memory: Memory normal. Cognition is impaired.     Comments: Insight fair and judgment fair to poor Chronic PM anxiety      Lab Review:     Component Value Date/Time   NA 137 06/19/2022 2213   K 3.5 06/19/2022 2213   CL 103 06/19/2022 2213   CO2 21 (L) 06/19/2022 2213   GLUCOSE 108 (H) 06/19/2022 2213   BUN 14 06/19/2022 2213   CREATININE 0.70 06/19/2022 2213   CALCIUM 9.2 06/19/2022 2213   PROT 6.6 06/19/2022 2213   ALBUMIN 4.0 06/19/2022 2213   AST 15 06/19/2022 2213   ALT 6 06/19/2022 2213   ALKPHOS 92 06/19/2022 2213   BILITOT 0.7 06/19/2022 2213  GFRNONAA >60 06/19/2022 2213   GFRAA >90  10/06/2012 1130       Component Value Date/Time   WBC 10.5 06/19/2022 2213   RBC 4.19 (L) 06/19/2022 2213   HGB 13.3 06/19/2022 2213   HCT 40.0 06/19/2022 2213   PLT 218 06/19/2022 2213   MCV 95.5 06/19/2022 2213   MCH 31.7 06/19/2022 2213   MCHC 33.3 06/19/2022 2213   RDW 13.1 06/19/2022 2213   LYMPHSABS 1.0 12/15/2021 1124   MONOABS 0.9 12/15/2021 1124   EOSABS 0.1 12/15/2021 1124   BASOSABS 0.1 12/15/2021 1124    Lithium Lvl  Date Value Ref Range Status  09/30/2021 0.22 (L) 0.60 - 1.20 mmol/L Final    Comment:    Performed at [facility], [address] [place], [place] [address]     No results found for: "PHENYTOIN", "PHENOBARB", "VALPROATE", "CBMZ"   .res Assessment: Plan:    Generalized anxiety disorder - Plan: escitalopram (LEXAPRO) 5 MG tablet  Bipolar I disorder, most recent episode depressed (HCC)  Panic disorder with agoraphobia - Plan: escitalopram (LEXAPRO) 5 MG tablet  Claustrophobia  Mild cognitive impairment  Insomnia due to mental condition  Parkinson's disease with dyskinesia and fluctuating manifestations   Dependent and help seeking behaviors.  30 non min face to face time with patient was spent on counseling and coordination of care. We discussed Anxiety and insomnia have worsened and remained poorly controlled.  Situation is driving a lot of this bc his fear of being alone.   Excessive sleepiness most likely related to Sinemet.     Stopped Seroquel and switched to Nuplazid in hopes of less sleepiness and to avoid worsening PD.  Mild visual hallucinations.    Discussed potential metabolic side effects associated with atypical antipsychotics, as well as potential risk for movement side effects. Have tried to stop Seroquel in hopes of less sleepiness and less risk of dystonia  Chronic insomnia is worse from anxiety. More panic to unmanageable degree.  More dependence behavior bc of anxiety  lorazepam 2 mg  LED prn 1/2  of 2 mg tablet TID prn   Call if sleep worsens or mania. They did not stop oxcarbazepine as previously disc, still taking 150 BID  Needs asst living.  Cont PT  Supportive therapy and health direction given severe dx PD and malignant melanoma.  And dealing with family's lack of attention. Disc risk of PD meds affecting psych status including  Risk of PD.  We discussed the short-term risks associated with benzodiazepines including sedation and increased fall risk among others.  Discussed long-term side effect risk including dependence, potential withdrawal symptoms, and the potential eventual dose-related risk of dementia.  But recent studies from 2020 dispute this association between benzodiazepines and dementia risk. Newer studies in 2020 do not support an association with dementia.  He is taking more of this.  Disc costs of Rx and how to manage for ex with [facility] he forgot to look into this and he was reminded about it today.  He was given written information about it.  Trial low dose Lexapro for anxiety.  Disc risk cycling and psychosis Lexapro 5 mg daily for anxiety   Patient agrees with this plan.  4 weeks  Bill Staggers, MD, DFAPA     Please see After Visit Summary for patient specific instructions.  No future appointments.    No orders of the defined types were placed in this encounter.      -------------------------------

## 2022-09-28 NOTE — Patient Instructions (Signed)
Escitalopram 5 mg once daily for anxiety

## 2022-09-29 ENCOUNTER — Other Ambulatory Visit: Payer: Self-pay | Admitting: Psychiatry

## 2022-09-29 ENCOUNTER — Telehealth: Payer: Self-pay | Admitting: Psychiatry

## 2022-09-29 DIAGNOSIS — F3130 Bipolar disorder, current episode depressed, mild or moderate severity, unspecified: Secondary | ICD-10-CM

## 2022-09-29 NOTE — Telephone Encounter (Signed)
Pt was seen yesterday with these instructions:  lorazepam 2 mg LED prn 1/2 of 2 mg tablet TID prn   Son reporting that he is getting 1/2 BID and 1 whole pill at night, so getting 1/2 tablet more than prescribed above. Son wants to verify dosing.

## 2022-09-29 NOTE — Telephone Encounter (Signed)
The way son is dosing it is fine if he needs that much.  BC his anxiety is worse in the evening take the second dose  early enough to help the evening anxiety.  I know it won't eliminate his anxiety.

## 2022-09-29 NOTE — Telephone Encounter (Signed)
Son, Thayer Ohm, called needing clarification on the dose/directions for Steve's Lorazepam.  [name] is saying that Dr. Jennelle Human told him yesterday he can take more being up to three 1/2 pills per day but according the nurse administrating his medications she said she is already giving 1/2 pill 2 a day and 1 full pill later in the day so that is more than [name] is saying he can take per Dr. Jennelle Human. He wants to get this dosing clarified so everyone is on the same page.  Please call to discuss

## 2022-10-08 ENCOUNTER — Other Ambulatory Visit: Payer: Self-pay | Admitting: Psychiatry

## 2022-10-08 DIAGNOSIS — F4001 Agoraphobia with panic disorder: Secondary | ICD-10-CM

## 2022-11-30 ENCOUNTER — Ambulatory Visit (INDEPENDENT_AMBULATORY_CARE_PROVIDER_SITE_OTHER): Payer: Self-pay | Admitting: Psychiatry

## 2022-11-30 DIAGNOSIS — Z91199 Patient's noncompliance with other medical treatment and regimen due to unspecified reason: Secondary | ICD-10-CM

## 2022-11-30 NOTE — Progress Notes (Signed)
No show

## 2022-12-04 ENCOUNTER — Other Ambulatory Visit: Payer: Self-pay | Admitting: Psychiatry

## 2022-12-04 DIAGNOSIS — F4001 Agoraphobia with panic disorder: Secondary | ICD-10-CM

## 2022-12-04 DIAGNOSIS — F411 Generalized anxiety disorder: Secondary | ICD-10-CM

## 2022-12-04 NOTE — Telephone Encounter (Signed)
Lf 09/26; lv 08/26 - NS 10/28- Changed rf to 0.

## 2022-12-04 NOTE — Telephone Encounter (Signed)
Pt is seriously ill with Parkinson's and has difficulty getting here.  Will agree to rf his meds.

## 2022-12-08 ENCOUNTER — Other Ambulatory Visit: Payer: Self-pay | Admitting: Psychiatry

## 2022-12-08 DIAGNOSIS — F3130 Bipolar disorder, current episode depressed, mild or moderate severity, unspecified: Secondary | ICD-10-CM

## 2022-12-08 NOTE — Telephone Encounter (Signed)
LF 08/14; LV 08/26; requested him to be rescheduled. Kept 90 day per Traci's request.

## 2022-12-08 NOTE — Telephone Encounter (Signed)
Please call patient to schedule appt. LV 08/26, no showed 10/28.

## 2022-12-10 NOTE — Telephone Encounter (Signed)
[facility]- no voice mail

## 2022-12-15 ENCOUNTER — Other Ambulatory Visit: Payer: Self-pay | Admitting: Psychiatry

## 2022-12-15 DIAGNOSIS — F4001 Agoraphobia with panic disorder: Secondary | ICD-10-CM

## 2022-12-15 NOTE — Telephone Encounter (Signed)
Lv 08/26 ; lf 10/18

## 2022-12-15 NOTE — Telephone Encounter (Signed)
Changed started date to 11/15

## 2022-12-15 NOTE — Telephone Encounter (Signed)
Please schedule pt an appt

## 2023-01-18 ENCOUNTER — Other Ambulatory Visit: Payer: Self-pay | Admitting: Psychiatry

## 2023-01-18 DIAGNOSIS — F3130 Bipolar disorder, current episode depressed, mild or moderate severity, unspecified: Secondary | ICD-10-CM

## 2023-01-18 NOTE — Telephone Encounter (Signed)
Please schedule pt an appt

## 2023-01-22 NOTE — Telephone Encounter (Addendum)
Called pt on 12/20 @ 3:25p; mailbox was full

## 2023-02-19 ENCOUNTER — Other Ambulatory Visit: Payer: Self-pay

## 2023-02-19 ENCOUNTER — Other Ambulatory Visit: Payer: Self-pay | Admitting: Psychiatry

## 2023-02-19 DIAGNOSIS — F4001 Agoraphobia with panic disorder: Secondary | ICD-10-CM

## 2023-02-19 DIAGNOSIS — F3130 Bipolar disorder, current episode depressed, mild or moderate severity, unspecified: Secondary | ICD-10-CM

## 2023-02-19 MED ORDER — LORAZEPAM 2 MG PO TABS
1.0000 mg | ORAL_TABLET | Freq: Three times a day (TID) | ORAL | 1 refills | Status: DC | PRN
Start: 2023-02-19 — End: 2023-02-19

## 2023-02-19 MED ORDER — LORAZEPAM 2 MG PO TABS
1.0000 mg | ORAL_TABLET | Freq: Three times a day (TID) | ORAL | 1 refills | Status: DC | PRN
Start: 2023-02-19 — End: 2023-04-08

## 2023-02-19 NOTE — Telephone Encounter (Signed)
Lf 12/16 lv 08/26

## 2023-03-02 ENCOUNTER — Other Ambulatory Visit: Payer: Self-pay | Admitting: Psychiatry

## 2023-03-02 DIAGNOSIS — F4001 Agoraphobia with panic disorder: Secondary | ICD-10-CM

## 2023-03-02 DIAGNOSIS — F411 Generalized anxiety disorder: Secondary | ICD-10-CM

## 2023-03-02 NOTE — Telephone Encounter (Signed)
Please schedule pt an apt.

## 2023-03-08 NOTE — Telephone Encounter (Signed)
Pt has appt 4/3

## 2023-03-15 ENCOUNTER — Other Ambulatory Visit: Payer: Self-pay | Admitting: Psychiatry

## 2023-03-15 DIAGNOSIS — F3130 Bipolar disorder, current episode depressed, mild or moderate severity, unspecified: Secondary | ICD-10-CM

## 2023-03-22 ENCOUNTER — Other Ambulatory Visit: Payer: Self-pay | Admitting: Psychiatry

## 2023-03-22 DIAGNOSIS — F3130 Bipolar disorder, current episode depressed, mild or moderate severity, unspecified: Secondary | ICD-10-CM

## 2023-04-08 ENCOUNTER — Other Ambulatory Visit: Payer: Self-pay | Admitting: Psychiatry

## 2023-04-08 DIAGNOSIS — F4001 Agoraphobia with panic disorder: Secondary | ICD-10-CM

## 2023-04-28 ENCOUNTER — Other Ambulatory Visit: Payer: Self-pay | Admitting: Psychiatry

## 2023-04-28 DIAGNOSIS — F4001 Agoraphobia with panic disorder: Secondary | ICD-10-CM

## 2023-05-06 ENCOUNTER — Telehealth: Payer: Medicare Other | Admitting: Psychiatry

## 2023-05-07 ENCOUNTER — Telehealth: Admitting: Psychiatry

## 2023-05-14 ENCOUNTER — Other Ambulatory Visit: Payer: Self-pay | Admitting: Psychiatry

## 2023-05-14 DIAGNOSIS — F4001 Agoraphobia with panic disorder: Secondary | ICD-10-CM

## 2023-05-14 NOTE — Telephone Encounter (Signed)
 Pt has been a no show for last 2 appts. Please call to schedule FU.

## 2023-05-19 NOTE — Telephone Encounter (Signed)
 [name]-sister requesting apt on a Friday after 1:00. She will be bringing him to apt. Bill Guerrero # [phone] & work: [phone]

## 2023-05-20 ENCOUNTER — Emergency Department (HOSPITAL_BASED_OUTPATIENT_CLINIC_OR_DEPARTMENT_OTHER)

## 2023-05-20 ENCOUNTER — Emergency Department (HOSPITAL_BASED_OUTPATIENT_CLINIC_OR_DEPARTMENT_OTHER)
Admission: EM | Admit: 2023-05-20 | Discharge: 2023-05-20 | Disposition: A | Discharge status: Home / Self Care | Attending: Emergency Medicine | Admitting: Emergency Medicine

## 2023-05-20 ENCOUNTER — Other Ambulatory Visit: Payer: Self-pay

## 2023-05-20 DIAGNOSIS — S0101XA Laceration without foreign body of scalp, initial encounter: Secondary | ICD-10-CM | POA: Diagnosis not present

## 2023-05-20 DIAGNOSIS — S0990XA Unspecified injury of head, initial encounter: Secondary | ICD-10-CM | POA: Diagnosis present

## 2023-05-20 DIAGNOSIS — Y9301 Activity, walking, marching and hiking: Secondary | ICD-10-CM | POA: Diagnosis not present

## 2023-05-20 DIAGNOSIS — G20C Parkinsonism, unspecified: Secondary | ICD-10-CM | POA: Insufficient documentation

## 2023-05-20 DIAGNOSIS — W010XXA Fall on same level from slipping, tripping and stumbling without subsequent striking against object, initial encounter: Secondary | ICD-10-CM | POA: Diagnosis not present

## 2023-05-20 DIAGNOSIS — W19XXXA Unspecified fall, initial encounter: Secondary | ICD-10-CM

## 2023-05-20 DIAGNOSIS — M542 Cervicalgia: Secondary | ICD-10-CM | POA: Diagnosis not present

## 2023-05-20 LAB — BASIC METABOLIC PANEL WITH GFR
Anion gap: 9 (ref 5–15)
BUN: 21 mg/dL (ref 8–23)
CO2: 23 mmol/L (ref 22–32)
Calcium: 9.3 mg/dL (ref 8.9–10.3)
Chloride: 106 mmol/L (ref 98–111)
Creatinine, Ser: 1 mg/dL (ref 0.61–1.24)
GFR, Estimated: >60 mL/min (ref >60)
Glucose, Bld: 99 mg/dL (ref 70–99)
Potassium: 4 mmol/L (ref 3.5–5.1)
Sodium: 138 mmol/L (ref 135–145)

## 2023-05-20 LAB — CBC
HCT: 38.1 % — ABNORMAL LOW (ref 39.0–52.0)
Hemoglobin: 12.8 g/dL — ABNORMAL LOW (ref 13.0–17.0)
MCH: 32.7 pg (ref 26.0–34.0)
MCHC: 33.6 g/dL (ref 30.0–36.0)
MCV: 97.2 fL (ref 80.0–100.0)
Platelets: 215 10*3/uL (ref 150–400)
RBC: 3.92 MIL/uL — ABNORMAL LOW (ref 4.22–5.81)
RDW: 13.1 % (ref 11.5–15.5)
WBC: 7.8 10*3/uL (ref 4.0–10.5)
nRBC: 0 % (ref 0.0–0.2)

## 2023-05-20 MED ORDER — LIDOCAINE-EPINEPHRINE (PF) 2 %-1:200000 IJ SOLN
10.0000 mL | Freq: Once | INTRAMUSCULAR | Status: AC
  Administered 2023-05-20: 10 mL
  Filled 2023-05-20: qty 20

## 2023-05-20 NOTE — Discharge Instructions (Addendum)
 CT imaging of your head and neck was normal.  You have 4 staples in place in your head.  Wash area gently with warm soapy water whenever you shower but do not place any hair product in this area.  Staples should be removed in about 7 days by primary care provider, urgent care or back in the emergency department..  You may take Tylenol for pain.  Please not hesitate to return if the worrisome signs and symptoms we discussed become apparent.

## 2023-05-20 NOTE — ED Notes (Addendum)
 EDP at bedside caring for laceration Pending dc

## 2023-05-20 NOTE — ED Notes (Signed)
 Pt wound and surrounding area cleansed with sterile water. Bleeding controlled at this time.

## 2023-05-20 NOTE — ED Triage Notes (Signed)
 Parkinson's tripped and fell while walking. Right head lac top of skull. Moist gauze applied to lac. Ha and Neck pain- collar in place.

## 2023-05-20 NOTE — ED Provider Notes (Signed)
 [place] EMERGENCY DEPARTMENT AT [facility] Provider Note   CSN: [id number] Arrival date & time: 05/20/23  1756     History  Chief Complaint  Patient presents with   [facility] Laceration    Bill Guerrero is a 73 y.o. male.   Fall  Head Laceration   73 year old male presents emergency department with complaints of fall.  Patient states he has history of Parkinson's disease and has difficulty walking at baseline.  States he was walking in his house and slowly fell like he was leaning towards his right side.  Was unable to correct causing him to fall.  States that he hit his head on the ground but denies blood thinner use, LOC.  Denies any visual service, gait abnormality from baseline, slurred speech, facial droop, weakness/sensory deficits in upper lower extremities.  Also complaining of some neck pain.  Denies any chest pain, shortness of breath, abdominal pain, nausea, vomiting, back pain otherwise.  States he is up-to-date on Tdap.  States he was only on the ground for a minute or 2 before he got up from the ground independent of assistance.  Past medical history significant for bipolar 1 disorder, chronic low back pain, Parkinson disease, hyperlipidemia  Home Medications Prior to Admission medications   Medication Sig Start Date End Date Taking? Authorizing Provider  AMBULATORY NON FORMULARY MEDICATION Upright walker Dx: [name] 10/27/19   [name], Von Grumbling, DO  Ascorbic Acid (VITAMIN C) 1000 MG tablet Take 1,000 mg by mouth 2 (two) times daily.    [provider]  B Complex-C (B-COMPLEX WITH VITAMIN C) tablet Take 1 tablet by mouth daily.    [provider]  carbidopa -levodopa  (SINEMET  CR) 50-200 MG tablet TAKE ONE TABLET BY MOUTH EVERY NIGHT AT BEDTIME 12/12/20   [name], Rebecca S, DO  carbidopa -levodopa  (SINEMET  IR) 25-100 MG tablet TAKE 2 TABLETS BY MOUTH AT 8AM, 2 TABLETS AT NOON, 2 TABLETS AT 4PM, AND 2 TABLETS AT 8PM 04/28/21   [name], Von Grumbling, DO   Cholecalciferol (VITAMIN D-3) 5000 UNITS TABS Take 1 tablet by mouth daily.    [provider]  escitalopram  (LEXAPRO ) 5 MG tablet TAKE 1 TABLET BY MOUTH DAILY 03/02/23   [name], Kennedy Peabody., MD  lamoTRIgine  (LAMICTAL ) 100 MG tablet TAKE 1 TABLET BY MOUTH 2 TIMES A DAY 03/22/23   [name], Kennedy Peabody., MD  lithium  carbonate (LITHOBID ) 300 MG ER tablet TAKE 1 TABLET BY MOUTH AT BEDTIME 03/15/23   [name], Kennedy Peabody., MD  LORazepam  (ATIVAN ) 2 MG tablet TAKE A HALF TABLET BY MOUTH EVERY 8 HOURS AS NEEDED FOR ANXIETY 05/14/23   [name], Kennedy Peabody., MD  losartan (COZAAR) 50 MG tablet Take 50 mg by mouth daily. Pt. Is unsure of Mg.    [provider]  magnesium  oxide (MAG-OX) 400 MG tablet Take 400 mg by mouth daily.    [provider]  mupirocin  cream (BACTROBAN ) 2 % Apply 1 Application topically 2 (two) times daily. 09/22/22   Kingsley, Victoria K, DO  niacin  500 MG tablet Take 500 mg by mouth daily with breakfast.    [provider]  Omega-3 Fatty Acids (FISH OIL BURP-LESS PO) Take 1 capsule by mouth in the morning and at bedtime.    [provider]  Opicapone  50 MG CAPS Take 50 mg by mouth daily. 12/03/20   [name], Von Grumbling, DO  OXcarbazepine  (TRILEPTAL ) 150 MG tablet TAKE 1 TABLET BY MOUTH 2 TIMES DAILY 08/11/22  [name], Kennedy Peabody., MD  Pimavanserin Tartrate  (NUPLAZID ) 34 MG CAPS Take 1 capsule (34 mg total) by mouth at bedtime. 12/11/21   [name], Kennedy Peabody., MD  Potassium 99 MG TABS Take 1 tablet by mouth daily.    [provider]  pramipexole  (MIRAPEX ) 0.5 MG tablet TAKE ONE TABLET BY MOUTH THREE TIMES A DAY 04/03/21   [name], Von Grumbling, DO  pravastatin  (PRAVACHOL ) 80 MG tablet Take 80 mg by mouth daily before breakfast.    [provider]  valACYclovir  (VALTREX ) 1000 MG tablet Take 1 tablet (1,000 mg total) by mouth 3 (three) times daily. 09/22/22   Kingsley, Victoria K, DO      Allergies    Patient has no known allergies.    Review of Systems    Review of Systems  All other systems reviewed and are negative.   Physical Exam Updated Vital Signs BP (!) 150/80   Pulse (!) 54   Temp 98.1 F (36.7 C) (Oral)   Resp 18   SpO2 97%  Physical Exam Vitals and nursing note reviewed.  Constitutional:      General: He is not in acute distress.    Appearance: He is well-developed.  HENT:     Head: Normocephalic.     Comments: Swelling appreciated right parietal region.  3.0V shaped laceration appreciated in area. Eyes:     Conjunctiva/sclera: Conjunctivae normal.  Cardiovascular:     Rate and Rhythm: Normal rate and regular rhythm.     Heart sounds: No murmur heard. Pulmonary:     Effort: Pulmonary effort is normal. No respiratory distress.     Breath sounds: Normal breath sounds.  Abdominal:     Palpations: Abdomen is soft.     Tenderness: There is no abdominal tenderness.  Musculoskeletal:        General: No swelling.     Cervical back: Neck supple.     Comments: Midline tenderness cervical spine with paraspinal tenderness noted bilaterally in cervical region.  No midline tenderness thoracic or lumbar spine.  No chest wall tenderness.  Full range of motion bilateral upper lower extremities without overlying tenderness.  Skin:    General: Skin is warm and dry.     Capillary Refill: Capillary refill takes less than 2 seconds.  Neurological:     Mental Status: He is alert.     Comments: Alert and oriented to self, place, time and event.   Speech is fluent, clear without dysarthria or dysphasia.   Moves all 4 extremities without difficulty. Sensation intact in upper/lower extremities   Able to ambulate independently CN I not tested  CN II not tested CN III, IV, VI PERRLA and EOMs intact bilaterally  CN V Intact sensation to sharp and light touch to the face  CN VII facial movements symmetric  CN VIII not tested  CN IX, X no uvula deviation, symmetric rise of soft palate  CN XI 5/5 SCM and trapezius strength  bilaterally  CN XII Midline tongue protrusion, symmetric L/R movements     Psychiatric:        Mood and Affect: Mood normal.     ED Results / Procedures / Treatments   Labs (all labs ordered are listed, but only abnormal results are displayed) Labs Reviewed  CBC - Abnormal; Notable for the following components:      Result Value   RBC 3.92 (*)    Hemoglobin 12.8 (*)    HCT 38.1 (*)    All other  components within normal limits  BASIC METABOLIC PANEL WITH GFR    EKG None  Radiology CT Head Wo Contrast Addendum Date: 05/20/2023 ADDENDUM REPORT: 05/20/2023 23:07 CLINICAL DATA:  Parkinson's disease, fall, scalp laceration EXAM: CT CERVICAL SPINE WITHOUT CONTRAST TECHNIQUE: Multidetector CT imaging of the cervical spine was performed without contrast. Multiplanar CT image reconstructions were also generated. RADIATION DOSE REDUCTION: This exam was performed according to the departmental dose-optimization program which includes automated exposure control, adjustment of the mA and/or kV according to patient size and/or use of iterative reconstruction technique. COMPARISON:  06/19/2022 FINDINGS: CT CERVICAL SPINE FINDINGS Alignment: Normal. Skull base and vertebrae: Craniocervical alignment is normal. Degenerative ankylosis of the atlantodental articulation. Anterior cervical discectomy and fusion with instrumentation C5-6 has been performed solid incorporation of interbody bone graft. No acute fracture of the cervical spine. Bulky anterior disc osteophytes are seen at C3-C5 and C6-7 resulting in mass effect upon the posterior hypopharynx. Soft tissues and spinal canal: No prevertebral fluid or swelling. No visible canal hematoma. Disc levels: Bulky disc osteophytes and mild disc space narrowing at C6-7 is present in keeping with changes of moderate degenerative disc disease. Prevertebral soft tissues are thickened on sagittal reformats. No high-grade canal stenosis. Multilevel facet and to a  lesser extent uncovertebral arthrosis is present resulting in multilevel severe neuroforaminal narrowing, most severe on the right at C2-3, C3-4, and C4-5 and on the left at C3-4, C4-5, and C7-T1 Upper chest: Excluded Other: None IMPRESSION: 1. No acute fracture or malalignment of the cervical spine. 2. Multilevel degenerative disc disease and facet arthrosis resulting in multilevel severe neuroforaminal narrowing as described above. 3. Anterior cervical discectomy and fusion at C5-6 has been performed. 4. Bulky anterior disc osteophytes at C3-C5 and C6-7 resulting in mass effect upon the posterior hypopharynx. Electronically Signed   By: Worthy Heads M.D.   On: 05/20/2023 23:07   Result Date: 05/20/2023 CLINICAL DATA:  Head trauma, moderate-severe; Neck trauma (Age >= 65y). Parkinson's disease, fall, scalp laceration EXAM: CT HEAD WITHOUT CONTRAST TECHNIQUE: Contiguous axial images were obtained from the base of the skull through the vertex without intravenous contrast. RADIATION DOSE REDUCTION: This exam was performed according to the departmental dose-optimization program which includes automated exposure control, adjustment of the mA and/or kV according to patient size and/or use of iterative reconstruction technique. COMPARISON:  06/19/2022 FINDINGS: Brain: Normal anatomic configuration. Parenchymal volume loss is commensurate with the patient's age. Stable mild periventricular white matter changes are present likely reflecting the sequela of small vessel ischemia. No abnormal intra or extra-axial mass lesion or fluid collection. No abnormal mass effect or midline shift. No evidence of acute intracranial hemorrhage or infarct. Ventricular size is normal. Cerebellum unremarkable. Vascular: No asymmetric hyperdense vasculature at the skull base. Skull: Intact Sinuses/Orbits: Paranasal sinuses are clear. Orbits are unremarkable. Other: Mastoid air cells and middle ear cavities are clear. Moderate right  parietal scalp hematoma with punctate gas noted. IMPRESSION: 1. No acute intracranial abnormality. No calvarial fracture. 2. Moderate right parietal scalp hematoma. Electronically Signed: By: Worthy Heads M.D. On: 05/20/2023 20:28   CT Cervical Spine Wo Contrast Addendum Date: 05/20/2023 ADDENDUM REPORT: 05/20/2023 23:07 CLINICAL DATA:  Parkinson's disease, fall, scalp laceration EXAM: CT CERVICAL SPINE WITHOUT CONTRAST TECHNIQUE: Multidetector CT imaging of the cervical spine was performed without contrast. Multiplanar CT image reconstructions were also generated. RADIATION DOSE REDUCTION: This exam was performed according to the departmental dose-optimization program which includes automated exposure control, adjustment of the mA and/or kV according  to patient size and/or use of iterative reconstruction technique. COMPARISON:  06/19/2022 FINDINGS: CT CERVICAL SPINE FINDINGS Alignment: Normal. Skull base and vertebrae: Craniocervical alignment is normal. Degenerative ankylosis of the atlantodental articulation. Anterior cervical discectomy and fusion with instrumentation C5-6 has been performed solid incorporation of interbody bone graft. No acute fracture of the cervical spine. Bulky anterior disc osteophytes are seen at C3-C5 and C6-7 resulting in mass effect upon the posterior hypopharynx. Soft tissues and spinal canal: No prevertebral fluid or swelling. No visible canal hematoma. Disc levels: Bulky disc osteophytes and mild disc space narrowing at C6-7 is present in keeping with changes of moderate degenerative disc disease. Prevertebral soft tissues are thickened on sagittal reformats. No high-grade canal stenosis. Multilevel facet and to a lesser extent uncovertebral arthrosis is present resulting in multilevel severe neuroforaminal narrowing, most severe on the right at C2-3, C3-4, and C4-5 and on the left at C3-4, C4-5, and C7-T1 Upper chest: Excluded Other: None IMPRESSION: 1. No acute fracture or  malalignment of the cervical spine. 2. Multilevel degenerative disc disease and facet arthrosis resulting in multilevel severe neuroforaminal narrowing as described above. 3. Anterior cervical discectomy and fusion at C5-6 has been performed. 4. Bulky anterior disc osteophytes at C3-C5 and C6-7 resulting in mass effect upon the posterior hypopharynx. Electronically Signed   By: Worthy Heads M.D.   On: 05/20/2023 23:07   Result Date: 05/20/2023 CLINICAL DATA:  Head trauma, moderate-severe; Neck trauma (Age >= 65y). Parkinson's disease, fall, scalp laceration EXAM: CT HEAD WITHOUT CONTRAST TECHNIQUE: Contiguous axial images were obtained from the base of the skull through the vertex without intravenous contrast. RADIATION DOSE REDUCTION: This exam was performed according to the departmental dose-optimization program which includes automated exposure control, adjustment of the mA and/or kV according to patient size and/or use of iterative reconstruction technique. COMPARISON:  06/19/2022 FINDINGS: Brain: Normal anatomic configuration. Parenchymal volume loss is commensurate with the patient's age. Stable mild periventricular white matter changes are present likely reflecting the sequela of small vessel ischemia. No abnormal intra or extra-axial mass lesion or fluid collection. No abnormal mass effect or midline shift. No evidence of acute intracranial hemorrhage or infarct. Ventricular size is normal. Cerebellum unremarkable. Vascular: No asymmetric hyperdense vasculature at the skull base. Skull: Intact Sinuses/Orbits: Paranasal sinuses are clear. Orbits are unremarkable. Other: Mastoid air cells and middle ear cavities are clear. Moderate right parietal scalp hematoma with punctate gas noted. IMPRESSION: 1. No acute intracranial abnormality. No calvarial fracture. 2. Moderate right parietal scalp hematoma. Electronically Signed: By: Worthy Heads M.D. On: 05/20/2023 20:28    Procedures .Laceration  Repair  Date/Time: 05/20/2023 11:53 PM  Performed by: [place] Butter, PA Authorized by: [place] Butter, PA   Consent:    Consent obtained:  Verbal   Consent given by:  Patient   Risks, benefits, and alternatives were discussed: yes     Risks discussed:  Infection, need for additional repair, nerve damage, poor wound healing, poor cosmetic result and pain   Alternatives discussed:  No treatment and delayed treatment Universal protocol:    Procedure explained and questions answered to patient or proxy's satisfaction: yes     Patient identity confirmed:  Verbally with patient Anesthesia:    Anesthesia method:  Local infiltration   Local anesthetic:  Lidocaine  2% WITH epi Laceration details:    Location:  Scalp   Length (cm):  3.1 Pre-procedure details:    Preparation:  Patient was prepped and draped in usual sterile fashion and  imaging obtained to evaluate for foreign bodies Exploration:    Limited defect created (wound extended): no     Hemostasis achieved with:  Direct pressure   Imaging obtained comment:  Ct   Imaging outcome: foreign body not noted     Wound exploration: wound explored through full range of motion and entire depth of wound visualized     Contaminated: no   Treatment:    Area cleansed with:  Saline   Amount of cleaning:  Standard   Irrigation solution:  Sterile water   Irrigation volume:  300cc   Irrigation method:  Syringe   Visualized foreign bodies/material removed: no     Debridement:  None   Undermining:  None   Scar revision: no   Skin repair:    Repair method:  Staples   Number of staples:  4 Approximation:    Approximation:  Close Repair type:    Repair type:  Simple Post-procedure details:    Dressing:  Open (no dressing)   Procedure completion:  Tolerated well, no immediate complications     Medications Ordered in ED Medications  lidocaine -EPINEPHrine (XYLOCAINE  W/EPI) 2 %-1:200000 (PF) injection 10 mL (10 mLs Infiltration Given  by Other 05/20/23 2213)    ED Course/ Medical Decision Making/ A&P                                 Medical Decision Making Amount and/or Complexity of Data Reviewed Labs: ordered. Radiology: ordered.  Risk Prescription drug management.   This patient presents to the ED for concern of fall, this involves an extensive number of treatment options, and is a complaint that carries with it a high risk of complications and morbidity.  The differential diagnosis includes CVA, fracture, strain/pain, dislocation, ligament/tendon injury, neurovascular labs, pneumothorax, other   Co morbidities that complicate the patient evaluation  See HPI   Additional history obtained:  Additional history obtained from EMR External records from outside source obtained and reviewed including hospital records   Lab Tests:  I Ordered, and personally interpreted labs.  The pertinent results include: No leukocytosis.  Anemia with hemoglobin of 12.8.  Platelets within range.  No chart notes.  No renal dysfunction.   Imaging Studies ordered:  I ordered imaging studies including CT head/cervical spine I independently visualized and interpreted imaging which showed no acute intracranial abnormality.  No calvarial fracture.  Moderate right parietal scalp hematoma.  No acute intracranial abnormality.  No acute fracture or traumatic subluxation of cervical spine.  Multilevel degenerative disc disease.  Anterior cervical discectomy and fusion C5-C6.  Bulky anterior osteophytes disc of C3-5 and C6-C7. I agree with the radiologist interpretation  Cardiac Monitoring: / EKG:  The patient was maintained on a cardiac monitor.  I personally viewed and interpreted the cardiac monitored which showed an underlying rhythm of: Sinus rhythm   Consultations Obtained:  N/a   Problem List / ED Course / Critical interventions / Medication management  Fall, laceration I ordered medication including lidocaine  with epi    Reevaluation of the patient after these medicines showed that the patient improved I have reviewed the patients home medicines and have made adjustments as needed   Social Determinants of Health:  Denies tobacco, illicit drug use.   Test / Admission - Considered:  Fall, laceration Vitals signs significant for hypertension blood pressure 145/80. Otherwise within normal range and stable throughout visit. Laboratory/imaging studies significant for: See above 73 year old  male presents emergency department with complaints of fall.  Patient states he has history of Parkinson's disease and has difficulty walking at baseline.  States he was walking in his house and slowly fell like he was leaning towards his right side.  Was unable to correct causing him to fall.  States that he hit his head on the ground but denies blood thinner use, LOC.  Denies any visual service, gait abnormality from baseline, slurred speech, facial droop, weakness/sensory deficits in upper lower extremities.  Also complaining of some neck pain.  Denies any chest pain, shortness of breath, abdominal pain, nausea, vomiting, back pain otherwise.  States he is up-to-date on Tdap.  States he was only on the ground for a minute or 2 before he got up from the ground independent of assistance. On exam, nonfocal neuroexam from patient's baseline.  3.2 cm laceration appreciated on right parietal region.  Laceration repaired manner as above.  CT imaging of both head and cervical spine obtained which was negative for any acute traumatic injury.  Patient reassured by findings.  Will recommend local wound care at home and follow-up in 1 week's time for staple removal.  Treatment plan discussed with patient and he acknowledged understanding was agreeable to said plan.  Patient overall well-appearing, afebrile in no acute distress. Worrisome signs and symptoms were discussed with the patient, and the patient acknowledged understanding to return to  the ED if noticed. Patient was stable upon discharge.          Final Clinical Impression(s) / ED Diagnoses Final diagnoses:  None    Rx / DC Orders ED Discharge Orders     None         [place] Butter, [place] 05/20/23 2354    Nicklas Barns, MD 05/21/23 1524

## 2023-05-20 NOTE — ED Notes (Signed)
 [facility] called for possible addendum to CT C-Spine.

## 2023-06-02 ENCOUNTER — Other Ambulatory Visit: Payer: Self-pay | Admitting: Psychiatry

## 2023-06-02 DIAGNOSIS — F3130 Bipolar disorder, current episode depressed, mild or moderate severity, unspecified: Secondary | ICD-10-CM

## 2023-06-02 NOTE — Telephone Encounter (Signed)
 Call pt or his son to get him on schedule.  Virtual ok.  He has Parkinson's.

## 2023-06-07 ENCOUNTER — Other Ambulatory Visit: Payer: Self-pay | Admitting: Psychiatry

## 2023-06-07 DIAGNOSIS — F4001 Agoraphobia with panic disorder: Secondary | ICD-10-CM

## 2023-06-07 DIAGNOSIS — F411 Generalized anxiety disorder: Secondary | ICD-10-CM

## 2023-06-13 ENCOUNTER — Other Ambulatory Visit: Payer: Self-pay

## 2023-06-13 ENCOUNTER — Emergency Department (HOSPITAL_COMMUNITY)
Admission: EM | Admit: 2023-06-13 | Discharge: 2023-06-14 | Disposition: A | Discharge status: Home / Self Care | Attending: Emergency Medicine | Admitting: Emergency Medicine

## 2023-06-13 ENCOUNTER — Emergency Department (HOSPITAL_COMMUNITY)

## 2023-06-13 DIAGNOSIS — R531 Weakness: Secondary | ICD-10-CM | POA: Insufficient documentation

## 2023-06-13 DIAGNOSIS — G20A1 Parkinson's disease without dyskinesia, without mention of fluctuations: Secondary | ICD-10-CM | POA: Insufficient documentation

## 2023-06-13 DIAGNOSIS — R42 Dizziness and giddiness: Secondary | ICD-10-CM | POA: Diagnosis present

## 2023-06-13 DIAGNOSIS — F419 Anxiety disorder, unspecified: Secondary | ICD-10-CM | POA: Diagnosis not present

## 2023-06-13 DIAGNOSIS — K8020 Calculus of gallbladder without cholecystitis without obstruction: Secondary | ICD-10-CM | POA: Insufficient documentation

## 2023-06-13 DIAGNOSIS — I10 Essential (primary) hypertension: Secondary | ICD-10-CM | POA: Diagnosis not present

## 2023-06-13 DIAGNOSIS — K760 Fatty (change of) liver, not elsewhere classified: Secondary | ICD-10-CM | POA: Insufficient documentation

## 2023-06-13 DIAGNOSIS — I700 Atherosclerosis of aorta: Secondary | ICD-10-CM | POA: Diagnosis not present

## 2023-06-13 LAB — CBC WITH DIFFERENTIAL/PLATELET
Abs Immature Granulocytes: 0.15 10*3/uL — ABNORMAL HIGH (ref 0.00–0.07)
Basophils Absolute: 0.1 10*3/uL (ref 0.0–0.1)
Basophils Relative: 1 %
Eosinophils Absolute: 0.2 10*3/uL (ref 0.0–0.5)
Eosinophils Relative: 3 %
HCT: 40.6 % (ref 39.0–52.0)
Hemoglobin: 13.1 g/dL (ref 13.0–17.0)
Immature Granulocytes: 2 %
Lymphocytes Relative: 15 %
Lymphs Abs: 1.1 10*3/uL (ref 0.7–4.0)
MCH: 32.8 pg (ref 26.0–34.0)
MCHC: 32.3 g/dL (ref 30.0–36.0)
MCV: 101.8 fL — ABNORMAL HIGH (ref 80.0–100.0)
Monocytes Absolute: 0.6 10*3/uL (ref 0.1–1.0)
Monocytes Relative: 9 %
Neutro Abs: 4.8 10*3/uL (ref 1.7–7.7)
Neutrophils Relative %: 70 %
Platelets: 225 10*3/uL (ref 150–400)
RBC: 3.99 MIL/uL — ABNORMAL LOW (ref 4.22–5.81)
RDW: 13 % (ref 11.5–15.5)
WBC: 6.9 10*3/uL (ref 4.0–10.5)
nRBC: 0 % (ref 0.0–0.2)

## 2023-06-13 LAB — URINALYSIS, W/ REFLEX TO CULTURE (INFECTION SUSPECTED)
Bacteria, UA: NONE SEEN
Bilirubin Urine: NEGATIVE
Glucose, UA: NEGATIVE mg/dL
Hgb urine dipstick: NEGATIVE
Ketones, ur: 5 mg/dL — AB
Leukocytes,Ua: NEGATIVE
Nitrite: NEGATIVE
Protein, ur: NEGATIVE mg/dL
Specific Gravity, Urine: 1.012 (ref 1.005–1.030)
pH: 6 (ref 5.0–8.0)

## 2023-06-13 LAB — COMPREHENSIVE METABOLIC PANEL WITH GFR
ALT: 10 U/L (ref 0–44)
AST: 18 U/L (ref 15–41)
Albumin: 4.6 g/dL (ref 3.5–5.0)
Alkaline Phosphatase: 87 U/L (ref 38–126)
Anion gap: 10 (ref 5–15)
BUN: 28 mg/dL — ABNORMAL HIGH (ref 8–23)
CO2: 23 mmol/L (ref 22–32)
Calcium: 9.3 mg/dL (ref 8.9–10.3)
Chloride: 103 mmol/L (ref 98–111)
Creatinine, Ser: 0.76 mg/dL (ref 0.61–1.24)
GFR, Estimated: >60 mL/min (ref >60)
Glucose, Bld: 125 mg/dL — ABNORMAL HIGH (ref 70–99)
Potassium: 4.2 mmol/L (ref 3.5–5.1)
Sodium: 136 mmol/L (ref 135–145)
Total Bilirubin: 1 mg/dL (ref 0.0–1.2)
Total Protein: 7.3 g/dL (ref 6.5–8.1)

## 2023-06-13 LAB — LIPASE, BLOOD: Lipase: 38 U/L (ref 11–51)

## 2023-06-13 MED ORDER — LITHIUM CARBONATE ER 300 MG PO TBCR
300.0000 mg | EXTENDED_RELEASE_TABLET | Freq: Every day | ORAL | Status: DC
  Administered 2023-06-13: 300 mg via ORAL
  Filled 2023-06-13: qty 1

## 2023-06-13 MED ORDER — MAGNESIUM OXIDE -MG SUPPLEMENT 400 (240 MG) MG PO TABS: 400.0000 mg | ORAL_TABLET | Freq: Every day | ORAL | Status: DC

## 2023-06-13 MED ORDER — LACTATED RINGERS IV BOLUS
500.0000 mL | Freq: Once | INTRAVENOUS | Status: AC
  Administered 2023-06-13: 500 mL via INTRAVENOUS

## 2023-06-13 MED ORDER — OXCARBAZEPINE 150 MG PO TABS
150.0000 mg | ORAL_TABLET | Freq: Two times a day (BID) | ORAL | Status: DC
  Administered 2023-06-14 (×2): 150 mg via ORAL
  Filled 2023-06-13 (×2): qty 1

## 2023-06-13 MED ORDER — CARBIDOPA-LEVODOPA ER 50-200 MG PO TBCR
1.0000 | EXTENDED_RELEASE_TABLET | Freq: Every day | ORAL | Status: DC
  Filled 2023-06-13: qty 1

## 2023-06-13 MED ORDER — IOHEXOL 300 MG/ML  SOLN
100.0000 mL | Freq: Once | INTRAMUSCULAR | Status: AC | PRN
  Administered 2023-06-13: 100 mL via INTRAVENOUS

## 2023-06-13 MED ORDER — PIMAVANSERIN TARTRATE 34 MG PO CAPS: 1.0000 | ORAL_CAPSULE | Freq: Every evening | ORAL | Status: DC | PRN

## 2023-06-13 MED ORDER — LORAZEPAM 1 MG PO TABS
1.0000 mg | ORAL_TABLET | Freq: Once | ORAL | Status: AC
  Administered 2023-06-13: 1 mg via ORAL
  Filled 2023-06-13: qty 1

## 2023-06-13 MED ORDER — LORAZEPAM 1 MG PO TABS
2.0000 mg | ORAL_TABLET | Freq: Three times a day (TID) | ORAL | Status: DC | PRN
  Administered 2023-06-14: 2 mg via ORAL
  Filled 2023-06-13: qty 2

## 2023-06-13 MED ORDER — ESCITALOPRAM OXALATE 10 MG PO TABS
5.0000 mg | ORAL_TABLET | Freq: Every day | ORAL | Status: DC
  Administered 2023-06-14: 5 mg via ORAL
  Filled 2023-06-13: qty 1

## 2023-06-13 MED ORDER — LAMOTRIGINE 100 MG PO TABS
100.0000 mg | ORAL_TABLET | Freq: Two times a day (BID) | ORAL | Status: DC
  Administered 2023-06-14 (×2): 100 mg via ORAL
  Filled 2023-06-13 (×2): qty 1

## 2023-06-13 MED ORDER — CARBIDOPA-LEVODOPA 25-100 MG PO TABS
2.0000 | ORAL_TABLET | Freq: Four times a day (QID) | ORAL | Status: DC
Start: 2023-06-14 — End: 2023-06-15
  Administered 2023-06-14: 2 via ORAL
  Filled 2023-06-13 (×6): qty 2

## 2023-06-13 MED ORDER — OPICAPONE 50 MG PO CAPS: 50.0000 mg | ORAL_CAPSULE | Freq: Every day | ORAL | Status: DC

## 2023-06-13 MED ORDER — PRAVASTATIN SODIUM 20 MG PO TABS: 80.0000 mg | ORAL_TABLET | Freq: Every day | ORAL | Status: DC

## 2023-06-13 MED ORDER — PRAMIPEXOLE DIHYDROCHLORIDE 0.25 MG PO TABS
0.5000 mg | ORAL_TABLET | Freq: Three times a day (TID) | ORAL | Status: DC
  Administered 2023-06-14 (×3): 0.5 mg via ORAL
  Filled 2023-06-13 (×4): qty 2

## 2023-06-13 MED ORDER — LOSARTAN POTASSIUM 50 MG PO TABS
50.0000 mg | ORAL_TABLET | Freq: Every day | ORAL | Status: DC
  Administered 2023-06-14: 50 mg via ORAL
  Filled 2023-06-13: qty 1

## 2023-06-13 MED ORDER — CARBIDOPA-LEVODOPA 25-100 MG PO TABS: 1.0000 | ORAL_TABLET | Freq: Three times a day (TID) | ORAL | Status: DC

## 2023-06-13 NOTE — ED Triage Notes (Signed)
 Pt called out due to increased weakness over the past 2-3 days. Pt does have parkinsons. He also has complaints of dark urine and feeling like his stomach is distended

## 2023-06-13 NOTE — ED Notes (Signed)
 Pt assisted to use the urinal. Condom catheter placed at this time as well.

## 2023-06-13 NOTE — ED Provider Notes (Signed)
 [place] EMERGENCY DEPARTMENT AT [facility] Provider Note   CSN: [id number] Arrival date & time: 06/13/23  1742     History  Chief Complaint  Patient presents with   Weakness    Bill Guerrero is a 73 y.o. male.   Weakness Patient is a 73 year old close the ED for any complaints of increased weakness and dizziness described as "light headedness" over the last 3 days combined with dark urine and distended abdomen.  Past medical history of Parkinson's disease, bipolar 1 disorder, HTN, arthritis.  Patient notes that he is very anxious.  Seen by psychiatry for his anxiety as well as evaluated by neurology for Parkinson's currently taking carbidopa  levodopa .  Reported that his urine was "black and tarry.".  States that he has felt slightly more unstable recently and felt clammy.  Denies headache, vision changes, chest pain, shortness of breath, abdominal pain, diarrhea.    Home Medications Prior to Admission medications   Medication Sig Start Date End Date Taking? Authorizing Provider  AMBULATORY NON FORMULARY MEDICATION Upright walker Dx: [name] 10/27/19   [name], Von Grumbling, DO  Ascorbic Acid (VITAMIN C) 1000 MG tablet Take 1,000 mg by mouth 2 (two) times daily.    [provider]  B Complex-C (B-COMPLEX WITH VITAMIN C) tablet Take 1 tablet by mouth daily.    [provider]  carbidopa -levodopa  (SINEMET  CR) 50-200 MG tablet TAKE ONE TABLET BY MOUTH EVERY NIGHT AT BEDTIME 12/12/20   [name], Von Grumbling, DO  carbidopa -levodopa  (SINEMET  IR) 25-100 MG tablet TAKE 2 TABLETS BY MOUTH AT 8AM, 2 TABLETS AT NOON, 2 TABLETS AT 4PM, AND 2 TABLETS AT 8PM 04/28/21   [name], Von Grumbling, DO  Cholecalciferol (VITAMIN D-3) 5000 UNITS TABS Take 1 tablet by mouth daily.    [provider]  escitalopram  (LEXAPRO ) 5 MG tablet TAKE 1 TABLET BY MOUTH DAILY 06/07/23   [name], Kennedy Peabody., MD  lamoTRIgine  (LAMICTAL ) 100 MG tablet TAKE 1 TABLET BY MOUTH 2 TIMES A DAY 06/04/23   [name],  Kennedy Peabody., MD  lithium  carbonate (LITHOBID ) 300 MG ER tablet TAKE 1 TABLET BY MOUTH AT BEDTIME 03/15/23   [name], Kennedy Peabody., MD  LORazepam  (ATIVAN ) 2 MG tablet TAKE A HALF TABLET BY MOUTH EVERY 8 HOURS AS NEEDED FOR ANXIETY 06/07/23   [name], Kennedy Peabody., MD  losartan (COZAAR) 50 MG tablet Take 50 mg by mouth daily. Pt. Is unsure of Mg.    [provider]  magnesium  oxide (MAG-OX) 400 MG tablet Take 400 mg by mouth daily.    [provider]  mupirocin  cream (BACTROBAN ) 2 % Apply 1 Application topically 2 (two) times daily. 09/22/22   Kingsley, Victoria K, DO  niacin  500 MG tablet Take 500 mg by mouth daily with breakfast.    [provider]  Omega-3 Fatty Acids (FISH OIL BURP-LESS PO) Take 1 capsule by mouth in the morning and at bedtime.    [provider]  Opicapone  50 MG CAPS Take 50 mg by mouth daily. 12/03/20   [name], Von Grumbling, DO  OXcarbazepine  (TRILEPTAL ) 150 MG tablet TAKE 1 TABLET BY MOUTH 2 TIMES DAILY 08/11/22   [name], Kennedy Peabody., MD  Pimavanserin Tartrate  (NUPLAZID ) 34 MG CAPS Take 1 capsule (34 mg total) by mouth at bedtime. 12/11/21   [name], Kennedy Peabody., MD  Potassium 99 MG TABS Take 1 tablet by mouth daily.    [provider]  pramipexole  (MIRAPEX ) 0.5 MG tablet TAKE  ONE TABLET BY MOUTH THREE TIMES A DAY 04/03/21   [name], Von Grumbling, DO  pravastatin  (PRAVACHOL ) 80 MG tablet Take 80 mg by mouth daily before breakfast.    [provider]  valACYclovir  (VALTREX ) 1000 MG tablet Take 1 tablet (1,000 mg total) by mouth 3 (three) times daily. 09/22/22   Kingsley, Victoria K, DO      Allergies    Patient has no known allergies.    Review of Systems   Review of Systems  Neurological:  Positive for weakness.  All other systems reviewed and are negative.   Physical Exam Updated Vital Signs BP 133/84 (BP Location: Left Arm)   Pulse 88   Temp 98.2 F (36.8 C) (Oral)   Resp 20   Ht 6\' 2"  (1.88 m)   Wt 104.3 kg   SpO2 100%   BMI 29.53  kg/m  Physical Exam Vitals and nursing note reviewed.  Constitutional:      General: He is not in acute distress.    Appearance: Normal appearance. He is not ill-appearing.  HENT:     Head: Normocephalic and atraumatic.  Eyes:     Extraocular Movements: Extraocular movements intact.     Conjunctiva/sclera: Conjunctivae normal.  Cardiovascular:     Rate and Rhythm: Normal rate and regular rhythm.     Pulses: Normal pulses.     Heart sounds: Normal heart sounds. No murmur heard.    No friction rub. No gallop.  Pulmonary:     Effort: Pulmonary effort is normal. No respiratory distress.     Breath sounds: Normal breath sounds. No stridor. No wheezing, rhonchi or rales.  Abdominal:     General: Abdomen is flat. There is no distension.     Palpations: Abdomen is soft.     Tenderness: There is no abdominal tenderness. There is no right CVA tenderness, left CVA tenderness or guarding.  Skin:    General: Skin is warm and dry.  Neurological:     General: No focal deficit present.     Mental Status: He is alert and oriented to person, place, and time. Mental status is at baseline.     Sensory: No sensory deficit.     Coordination: Coordination normal.  Psychiatric:     Comments: Patient is very anxious upon evaluation, both hands are shaking which he states are due to severity     ED Results / Procedures / Treatments   Labs (all labs ordered are listed, but only abnormal results are displayed) Labs Reviewed  COMPREHENSIVE METABOLIC PANEL WITH GFR - Abnormal; Notable for the following components:      Result Value   Glucose, Bld 125 (*)    BUN 28 (*)    All other components within normal limits  CBC WITH DIFFERENTIAL/PLATELET - Abnormal; Notable for the following components:   RBC 3.99 (*)    MCV 101.8 (*)    Abs Immature Granulocytes 0.15 (*)    All other components within normal limits  URINALYSIS, W/ REFLEX TO CULTURE (INFECTION SUSPECTED) - Abnormal; Notable for the  following components:   Ketones, ur 5 (*)    All other components within normal limits  LIPASE, BLOOD    EKG None  Radiology CT ABDOMEN PELVIS W CONTRAST Result Date: 06/13/2023 CLINICAL DATA:  Abdominal pain, acute, nonlocalized Weakness. EXAM: CT ABDOMEN AND PELVIS WITH CONTRAST TECHNIQUE: Multidetector CT imaging of the abdomen and pelvis was performed using the standard protocol following bolus administration of intravenous contrast. RADIATION DOSE REDUCTION:  This exam was performed according to the departmental dose-optimization program which includes automated exposure control, adjustment of the mA and/or kV according to patient size and/or use of iterative reconstruction technique. CONTRAST:  OMNIPAQUE  IOHEXOL  300 MG/ML  SOLN COMPARISON:  06/19/2022 FINDINGS: Lower chest: No basilar airspace disease or pleural effusion. Hepatobiliary: Mild diffuse hepatic steatosis. No focal liver abnormality. Small gallstones on prior exam are again seen, but partially obscured by motion on the current exam. No abnormal gallbladder distention or pericholecystic inflammation. No biliary dilatation. Pancreas: No ductal dilatation or inflammation. Spleen: Normal in size without focal abnormality. Adrenals/Urinary Tract: No adrenal nodule. No hydronephrosis or renal calculi. No evidence of renal inflammation. Small right renal cyst. No further follow-up imaging is recommended. Unremarkable urinary bladder. Stomach/Bowel: No abnormal gastric distension. No small bowel obstruction or inflammatory change. Moderate volume of stool throughout the colon with colonic redundancy. The appendix is not visualized. No colonic inflammation. Vascular/Lymphatic: Aorto bi-iliac atherosclerosis. No aortic aneurysm. The portal vein is patent. No suspicious abdominopelvic adenopathy. Reproductive: Obscured by streak artifact from left hip arthroplasty. Penile prosthesis partially included in the field of view. Other: No free air,  free fluid, or intra-abdominal fluid collection. Minimal fat containing umbilical hernia. Musculoskeletal: No acute osseous findings. Left hip arthroplasty and postsurgical change in the lumbar spine. There are no acute or suspicious osseous abnormalities. Right hip osteoarthritis. Diffuse lumbar spondylosis. IMPRESSION: 1. No acute abnormality or explanation for abdominal pain. 2. Moderate colonic stool burden. 3. Cholelithiasis without cholecystitis. 4. Mild hepatic steatosis. Aortic Atherosclerosis (ICD10-I70.0). Electronically Signed   By: Chadwick Colonel M.D.   On: 06/13/2023 21:27   CT Head Wo Contrast Result Date: 06/13/2023 CLINICAL DATA:  Headache, new onset. Increased weakness over the past 2-3 days. EXAM: CT HEAD WITHOUT CONTRAST TECHNIQUE: Contiguous axial images were obtained from the base of the skull through the vertex without intravenous contrast. RADIATION DOSE REDUCTION: This exam was performed according to the departmental dose-optimization program which includes automated exposure control, adjustment of the mA and/or kV according to patient size and/or use of iterative reconstruction technique. COMPARISON:  CT head 05/20/2023. FINDINGS: Brain: No acute intracranial hemorrhage. No CT evidence of acute infarct. No edema, mass effect, or midline shift. The basilar cisterns are patent. Ventricles: The ventricles are normal. Vascular: Atherosclerotic calcifications of the carotid siphons. No hyperdense vessel. Skull: No acute or aggressive finding. Orbits: Orbits are symmetric. Sinuses: Mild mucosal thickening in the left ethmoid sinus. Other: Small focus of soft tissue swelling in the right parietal scalp at the site of prior hematoma, significantly decreased from prior. Mastoid air cells are clear. IMPRESSION: No CT evidence of acute intracranial abnormality. Mild soft tissue swelling in the right parietal scalp at the site of prior hematoma, significantly decreased from prior. Electronically  Signed   By: Denny Flack M.D.   On: 06/13/2023 20:47    Procedures Procedures    Medications Ordered in ED Medications  LORazepam  (ATIVAN ) tablet 1 mg (has no administration in time range)  carbidopa -levodopa  (SINEMET  CR) 50-200 MG per tablet controlled release 1 tablet (has no administration in time range)  carbidopa -levodopa  (SINEMET  IR) 25-100 MG per tablet immediate release 1 tablet (has no administration in time range)  escitalopram  (LEXAPRO ) tablet 5 mg (has no administration in time range)  lamoTRIgine  (LAMICTAL ) tablet 100 mg (has no administration in time range)  lithium  carbonate (LITHOBID ) ER tablet 300 mg (has no administration in time range)  LORazepam  (ATIVAN ) tablet 2 mg (has no administration in time  range)  losartan (COZAAR) tablet 50 mg (has no administration in time range)  magnesium  oxide (MAG-OX) tablet 400 mg (has no administration in time range)  OXcarbazepine  (TRILEPTAL ) tablet 150 mg (has no administration in time range)  Opicapone  CAPS 50 mg (has no administration in time range)  Pimavanserin Tartrate  CAPS 34 mg (has no administration in time range)  pramipexole  (MIRAPEX ) tablet 0.5 mg (has no administration in time range)  pravastatin  (PRAVACHOL ) tablet 80 mg (has no administration in time range)  iohexol  (OMNIPAQUE ) 300 MG/ML solution 100 mL (100 mLs Intravenous Contrast Given 06/13/23 1957)  lactated ringers  bolus 500 mL (500 mLs Intravenous New Bag/Given 06/13/23 2219)    ED Course/ Medical Decision Making/ A&P                                 Medical Decision Making Amount and/or Complexity of Data Reviewed Radiology: ordered.  Risk Prescription drug management.   This patient is a 73 year old male who presents to the ED for concern of dark urine, dizziness, generalized weakness x 3 days noting that his abdomen feels distended.  On physical exam, patient is in no acute distress, afebrile, alert and orient x 4, speaking in full sentences,  nontachypneic, nontachycardic.  Patient's abdomen does not appear distended and is nontender to palpation in all 4 quadrants.  LCTAB.  No murmur.  no CVA tenderness.  No lower leg swelling noted.  Unremarkable physical exam.   With patient symptoms, labs and imaging ordered .  CT head was unremarkable and CT abdomen was also unremarkable for any acute abnormalities.  Patient's urine did not show any signs of infection or abnormality.    On reevaluation, patient states that he is feeling better but does feel that he is little dehydrated.  He is wishing to be admitted however do not find any reason for him to be admitted at this time.  He states that he is anxious because he lives by himself.  Will provide him fluids and reevaluate, anticipating that he will go home as there is no reason according to labs or imaging at this time to have him admitted to the hospital.  Upon reevaluation, patient states that he is adamant that he be admitted to the hospital due to being very anxious and not feeling confident in his ability to get around the house and does not have anyone that can stay with him tonight.  Will order PT evaluation and have him board here and tomorrow will evaluate if he feels capable of going home and or be able to find someone to stay with him.   Differential diagnoses prior to evaluation: The emergent differential diagnosis includes, but is not limited to, UTI, nephrolithiasis, tumor, infection, intracranial bleed, central vertigo, hypovolemia, metabolic disturbance. This is not an exhaustive differential.   Past Medical History / Co-morbidities / Social History: Bipolar 1 disorder, Parkinson's disease chronic low back pain, HTN, arthritis,  Additional history: Chart reviewed. Pertinent results include: Last seen on 05/20/2023 for fall.  For which she had CT imaging done at that time.  Lab Tests/Imaging studies: I personally interpreted labs/imaging and the pertinent results include:    CBC unremarkable CMP shows mildly elevated BUN at 28 but otherwise unremarkable UA unremarkable Lipase unremarkable CT abdomen Shows moderate colonic stool burden as well as cholelithiasis without cholecystitis and mild hepatic steatosis .CT head shows mild soft tissue swelling in the right parietal scalp at the  site of prior hematoma which is decreased from prior but otherwise unremarkable  I agree with the radiologist interpretation.  Medications: I ordered medication including LR.  I have reviewed the patients home medicines and have made adjustments as needed.   Disposition: After consideration of the diagnostic results and the patients response to treatment, I feel that the patient would benefit boarding, PT evaluation and reevaluation tomorrow to see if anyone else is able to stay at home with him.   Final Clinical Impression(s) / ED Diagnoses Final diagnoses:  Anxiety  Weakness    Rx / DC Orders ED Discharge Orders     None         Hayes Lipps, PA-C 06/13/23 2230    Afton Horse T, DO 06/17/23 [phone]

## 2023-06-13 NOTE — ED Notes (Signed)
 Lab will add lipase to existing blood work.

## 2023-06-14 DIAGNOSIS — F419 Anxiety disorder, unspecified: Secondary | ICD-10-CM | POA: Diagnosis not present

## 2023-06-14 MED ORDER — OXYCODONE-ACETAMINOPHEN 5-325 MG PO TABS
1.0000 | ORAL_TABLET | Freq: Once | ORAL | Status: AC
  Administered 2023-06-14: 1 via ORAL
  Filled 2023-06-14: qty 1

## 2023-06-14 NOTE — Progress Notes (Signed)
 Per chart review, pt has Medicare A/B without [facility] waiver and does not have a qualifying inpatient stay. Patient is unable to be placed into SNF from Emergency Department. RNCM to assist with arranging home health.

## 2023-06-14 NOTE — Discharge Instructions (Addendum)
 Private Duty/ Pay Resources:   [facility]: [phone] [facility] Services: [phone] [facility]: [phone] [facility] Care: [phone] [facility]: [phone] [email or web address]

## 2023-06-14 NOTE — ED Notes (Addendum)
 Pt c/o back pain rated 10/10 with no relief when repositioned. Notified ED MD who placed med orders. See eMAR. Allowed pt sit on side of bed with nurse supervision to help relieve pressure. Pt verbalized some relief.

## 2023-06-14 NOTE — Evaluation (Signed)
 Physical Therapy Evaluation Patient Details Name: Bill Guerrero MRN: [id number] DOB: [birth year] Today's Date: 06/14/2023  History of Present Illness  Pt is 74 yo male admitted on 06/13/23 with increased weakness.  Pt with no qualifying inpatient stay.  Pt with hx including Parkinson's, bipolar disorder, HTN, arthritis.  Clinical Impression  Pt admitted with above diagnosis. Pt expressing at baseline his mobility and adls are gradually declining and becoming difficult.  He resides at ILF where he doesn't really have any assist from staff, has difficulty making it to the dining hall, and sister checks on him 3 x week. He expressed loss of interest/energy for adls and iadls.  Also reports having "Parkinsons Attacks" in which he may be immobile for 2-12 hours.  Noting that pt does not have admitting dx and spoke with TOC who reports not SNF candidate b/c not inpatient 3 days with traditional Medicare.  Pt was able to transfer with CGA and ambulated 37' with RW and CGA.  Pt presenting likely near his current baseline but baseline is gradually declining from Parkinsons.  Does appear that pt needs more assistance at home - discussed options to consider aides or higher level of care like ALF for the future -pt asking what options are available - deferred to [facility] team.  As pt not SNF candidate - would recommend HHPT and increased support at this time.  Pt currently with functional limitations due to the deficits listed below (see PT Problem List). Pt will benefit from acute skilled PT to increase their independence and safety with mobility to allow discharge.           If plan is discharge home, recommend the following: A little help with walking and/or transfers;A little help with bathing/dressing/bathroom;Assistance with cooking/housework;Help with stairs or ramp for entrance   Can travel by private vehicle        Equipment Recommendations None recommended by PT  Recommendations for Other Services        Functional Status Assessment Patient has had a recent decline in their functional status and demonstrates the ability to make significant improvements in function in a reasonable and predictable amount of time.     Precautions / Restrictions Precautions Precautions: Fall      Mobility  Bed Mobility Overal bed mobility: Needs Assistance Bed Mobility: Supine to Sit, Sit to Supine     Supine to sit: Supervision, Used rails Sit to supine: Supervision, Used rails        Transfers Overall transfer level: Needs assistance Equipment used: Rolling walker (2 wheels) Transfers: Sit to/from Stand Sit to Stand: Contact guard assist                Ambulation/Gait Ambulation/Gait assistance: Contact guard assist Gait Distance (Feet): 85 Feet Assistive device: Rolling walker (2 wheels) Gait Pattern/deviations: Step-through pattern, Decreased stride length, Trunk flexed Gait velocity: decreased     General Gait Details: cues for RW proximity and posture; reports fatigues easily  Stairs            Wheelchair Mobility     Tilt Bed    Modified Rankin (Stroke Patients Only)       Balance Overall balance assessment: Needs assistance Sitting-balance support: No upper extremity supported Sitting balance-Leahy Scale: Good     Standing balance support: Bilateral upper extremity supported, Single extremity supported Standing balance-Leahy Scale: Poor Standing balance comment: Steady wtih RW or rail  Pertinent Vitals/Pain Pain Assessment Pain Assessment: No/denies pain    Home Living Family/patient expects to be discharged to:: Private residence Living Arrangements: Alone Available Help at Discharge: Family;Available PRN/intermittently (sister checks a few times a week, no assistance at facility) Type of Home: Independent living facility Home Access: Elevator       Home Layout: One level Home Equipment: Shower  seat;Grab bars - tub/shower;Rolling Environmental consultant (2 wheels);Electric scooter;Lift chair Additional Comments: upright rollator    Prior Function Prior Level of Function : Needs assist             Mobility Comments: Reports sleeps in lift chair.  States gradual decline in mobility from Parkinson's particularly the last month.  He was able to ambulate 1-2 miles 2 years ago, then limited to facility distances, now has difficulty getting to dining hall.  Denies falls.  Does reports has "Parkinsons Attacks," than he can feel coming, has to get in his chair, then may be stuck there for 2-12 hr. ADLs Comments: Reports doing ADLs but getting difficult.  Sister helps with groceries/driving.  Has lost interest in ADLs, IADLs.     Extremity/Trunk Assessment   Upper Extremity Assessment Upper Extremity Assessment: Overall WFL for tasks assessed    Lower Extremity Assessment Lower Extremity Assessment: LLE deficits/detail;RLE deficits/detail RLE Deficits / Details: ROM WFL; MMT 5/5 but fatigues easily LLE Deficits / Details: ROM WFL; MMT 5/5 but fatigues easily    Cervical / Trunk Assessment Cervical / Trunk Assessment: Kyphotic  Communication        Cognition Arousal: Alert Behavior During Therapy: Flat affect   PT - Cognitive impairments: No apparent impairments                       PT - Cognition Comments: Pt expressing disinterest in daily activities         Cueing       General Comments      Exercises     Assessment/Plan    PT Assessment Patient needs continued PT services  PT Problem List Decreased strength;Pain;Decreased range of motion;Decreased activity tolerance;Decreased balance;Decreased mobility;Decreased knowledge of use of DME       PT Treatment Interventions Therapeutic exercise;DME instruction;Gait training;Neuromuscular re-education;Functional mobility training;Therapeutic activities;Patient/family education;Balance training    PT Goals (Current  goals can be found in the Care Plan section)  Acute Rehab PT Goals Patient Stated Goal: "I don't know what I need, where I need to go."  Expressed needing more assistance PT Goal Formulation: With patient Time For Goal Achievement: 06/28/23 Potential to Achieve Goals: Fair    Frequency Min 2X/week     Co-evaluation               AM-PAC PT "6 Clicks" Mobility  Outcome Measure Help needed turning from your back to your side while in a flat bed without using bedrails?: A Little Help needed moving from lying on your back to sitting on the side of a flat bed without using bedrails?: A Little Help needed moving to and from a bed to a chair (including a wheelchair)?: A Little Help needed standing up from a chair using your arms (e.g., wheelchair or bedside chair)?: A Little Help needed to walk in hospital room?: A Little Help needed climbing 3-5 steps with a railing? : A Little 6 Click Score: 18    End of Session Equipment Utilized During Treatment: Gait belt Activity Tolerance: Patient tolerated treatment well Patient left: in bed (in ED hallway)  Nurse Communication: Mobility status PT Visit Diagnosis: Other abnormalities of gait and mobility (R26.89);Muscle weakness (generalized) (M62.81)    Time: 1123-1202 PT Time Calculation (min) (ACUTE ONLY): 39 min   Charges:   PT Evaluation $PT Eval Low Complexity: 1 Low PT Treatments $Gait Training: 8-22 mins $Therapeutic Activity: 8-22 mins PT General Charges $$ ACUTE PT VISIT: 1 Visit         [name], PT Acute Rehab [facility] [phone]   [name] 06/14/2023, 12:18 PM

## 2023-06-14 NOTE — Care Management (Addendum)
 Transition of Care [facility]) - Emergency Department Mini Assessment   Patient Details  Name: Bill Guerrero MRN: [id number] Date of Birth: [birth year]  Transition of Care [facility]) CM/SW Contact:    Naoma Bacca, RN Phone Number: 06/14/2023, 12:55 PM   Clinical Narrative: Per chart review patient currently in [facility] ED for complaint of weakness over the past 2-3 days w/ PMH of Parkinson's. This RNCM spoke with patient at bedside to offer choice for [facility] services. Patient reports he resides at [facility] and prefers not to use [facility] services. Patient chose [facility]. Patient reports prior to admission he has the following DME: wheelchair, lift chair.  This RNCM notified EDP for [facility] and face to face orders. Awaiting orders. This RNCM spoke with Cindie with [facility] who will follow patient for HHPT/HHA services.   Transportation at discharge: patient will call his sister  -2:34pm Received message from RN requesting this RNCM give Dana patient's caregiver a call [phone]. Spoke with Bernabe Brew who reports she is patient's Engineer, drilling. Bernabe Brew reports she does not work with [employer] however received a call from [facility] re: a referral that was made. Bernabe Brew request a call once patient is discharged. This RNCM advised HH services have been coordinated. [facility] will call post discharge to set up HHPT/OT, HHA.  HH orders are in EPIC.   - 6:36p This RNCM spoke with caregiver Bernabe Brew to advise patient getting close to discharge. Bernabe Brew reports she has set up his medications at his home/ [place].    No additional TOC needs.  ED Mini Assessment: What brought you to the Emergency Department? : Patient c/o increased weakness over the past 2-3 days  Barriers to Discharge: Barriers Resolved  Barrier interventions: coordinating HH services     Interventions which prevented an admission or readmission: Home Health Consult or Services    Patient Contact and  Communications        ,          Patient states their goals for this hospitalization and ongoing recovery are:: TO feel better CMS Medicare.gov Compare Post Acute Care list provided to:: Patient Choice offered to / list presented to : Patient  Admission diagnosis:  weakness Patient Active Problem List   Diagnosis Date Noted   Chronic midline low back pain without sciatica 12/06/2017   Lumbar post-laminectomy syndrome 12/06/2017   Cervical post-laminectomy syndrome 12/06/2017   Parkinson's disease (HCC) 12/06/2017   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   Hyperlipemia    Bipolar 1 disorder (HCC)    PCP:  Gerrianne Krauss, PA-C Pharmacy:   [facility] [place]  [place], [place] - [address] [facility] [address] [facility] [address] [place] [address] Phone: [phone] Fax: [phone]  [facility] [id number] [place], [place] - [address] [place] [address] Phone: [phone] Fax: [phone]  [facility] - [place], [place] - [address] [place] [address] Phone: [phone] Fax: [phone]

## 2023-06-14 NOTE — Discharge Planning (Signed)
 RNCM consulted regarding home health services.  Pt resides at [facility] that provides home health services through [facility].  RNCM spoke with Bernabe Brew of [facility], who is aware of pt and will add him to the roster for services.  [facility] provides assistance with ADLs and mobility.  TOC will continue to follow for any additional needs identified by PT.   Daden Mahany J. Rachel Budds, RN, BSN, NCM  Transitions of Care  Nurse Case Manager  [facility] Emergency Departments  Operative Services  [phone]

## 2023-06-14 NOTE — ED Provider Notes (Signed)
 Emergency Medicine Observation Re-evaluation Note  Bill Guerrero is a 73 y.o. male, seen on rounds today.  Pt initially presented to the ED for complaints of Weakness Currently, the patient is resting comfortably.  Physical Exam  BP 126/68 (BP Location: Left Arm)   Pulse 76   Temp 98.2 F (36.8 C) (Oral)   Resp 18   Ht 6\' 2"  (1.88 m)   Wt 104.3 kg   SpO2 97%   BMI 29.53 kg/m  Physical Exam Vitals and nursing note reviewed.  Constitutional:      General: He is not in acute distress.    Appearance: He is well-developed.  HENT:     Head: Normocephalic and atraumatic.  Eyes:     Conjunctiva/sclera: Conjunctivae normal.  Cardiovascular:     Rate and Rhythm: Normal rate and regular rhythm.     Heart sounds: No murmur heard. Pulmonary:     Effort: Pulmonary effort is normal. No respiratory distress.  Musculoskeletal:        General: No swelling.     Cervical back: Neck supple.  Skin:    General: Skin is warm and dry.  Neurological:     Mental Status: He is alert.  Psychiatric:        Mood and Affect: Mood normal.     ED Course / MDM  EKG:EKG Interpretation Date/Time:  Sunday Jun 13 2023 18:05:58 EDT Ventricular Rate:  101 PR Interval:    QRS Duration:  122 QT Interval:  407 QTC Calculation: 528 R Axis:   -30  Text Interpretation: Atrial flutter LVH with IVCD and secondary repol abnrm Prolonged QT interval Otherwise no significant change Confirmed by Albertus Hughs [phone]) on 06/14/2023 8:08:57 AM  I have reviewed the labs performed to date as well as medications administered while in observation.  Recent changes in the last 24 hours include TOC evaluation.  Patient has home health and everything he needs and patient would like to be discharged..  Plan  Current plan is for discharge.    Karlyn Overman, MD 06/14/23 [phone]

## 2023-06-14 NOTE — Progress Notes (Signed)
 PT Cancellation Note  Patient Details Name: Bill Guerrero MRN: [id number] DOB: 1951/01/30   Cancelled Treatment:    Reason Eval/Treat Not Completed: Other (comment) Checked at 10 and pt just got breakfast, returned 1037 and pt still eating.  Will f/u as able.  [name], PT Acute Rehab [facility] Rehab [phone]   [name] 06/14/2023, 10:41 AM

## 2023-06-15 ENCOUNTER — Other Ambulatory Visit: Payer: Self-pay | Admitting: Psychiatry

## 2023-06-15 DIAGNOSIS — F3130 Bipolar disorder, current episode depressed, mild or moderate severity, unspecified: Secondary | ICD-10-CM

## 2023-07-08 ENCOUNTER — Other Ambulatory Visit: Payer: Self-pay | Admitting: Psychiatry

## 2023-07-08 DIAGNOSIS — F4001 Agoraphobia with panic disorder: Secondary | ICD-10-CM

## 2023-08-04 ENCOUNTER — Encounter: Payer: Self-pay | Admitting: Psychiatry

## 2023-08-04 ENCOUNTER — Ambulatory Visit: Admitting: Psychiatry

## 2023-08-04 DIAGNOSIS — F4001 Agoraphobia with panic disorder: Secondary | ICD-10-CM | POA: Diagnosis not present

## 2023-08-04 DIAGNOSIS — F40240 Claustrophobia: Secondary | ICD-10-CM | POA: Diagnosis not present

## 2023-08-04 DIAGNOSIS — G3184 Mild cognitive impairment, so stated: Secondary | ICD-10-CM

## 2023-08-04 DIAGNOSIS — F411 Generalized anxiety disorder: Secondary | ICD-10-CM

## 2023-08-04 DIAGNOSIS — F3130 Bipolar disorder, current episode depressed, mild or moderate severity, unspecified: Secondary | ICD-10-CM | POA: Diagnosis not present

## 2023-08-04 DIAGNOSIS — F5105 Insomnia due to other mental disorder: Secondary | ICD-10-CM

## 2023-08-04 DIAGNOSIS — G20B2 Parkinson's disease with dyskinesia, with fluctuations: Secondary | ICD-10-CM

## 2023-08-04 MED ORDER — LORAZEPAM 2 MG PO TABS: 1.0000 mg | ORAL_TABLET | Freq: Three times a day (TID) | ORAL | 3 refills | Status: DC | PRN

## 2023-08-04 MED ORDER — OXCARBAZEPINE 150 MG PO TABS: 150.0000 mg | ORAL_TABLET | Freq: Two times a day (BID) | ORAL | 1 refills | Status: DC

## 2023-08-04 MED ORDER — LITHIUM CARBONATE ER 300 MG PO TBCR
300.0000 mg | EXTENDED_RELEASE_TABLET | Freq: Every day | ORAL | 1 refills | Status: DC
Start: 2023-08-04 — End: 2023-09-10

## 2023-08-04 MED ORDER — LAMOTRIGINE 100 MG PO TABS: 100.0000 mg | ORAL_TABLET | Freq: Two times a day (BID) | ORAL | 1 refills | Status: DC

## 2023-08-04 NOTE — Progress Notes (Signed)
 Bill Guerrero [id number] 12/20/1950 73 y.o.   Subjective:   Patient ID:  Bill Guerrero is a 73 y.o. (DOB [birth year]) male.  Chief Complaint:  Chief Complaint  Patient presents with   Follow-up   Depression   Anxiety   Memory Loss   Stress    HPI Bill Guerrero presents to the office today for follow-up of mood disorder, anxiety, and chronic insomnia.  He is chronically stressed and down due to the combination of dealing with Parkinson's disease and history of cancer.  seen in march 2021.  No meds were changed. The following was noted: Started playing banjo and taking lessons.  Practices daily.   Stress many things he can no longer do physically.  Can't do housework like before.  Getting him down.  Pushing himself to walk with a walker.  Pain in walking without walker.   Has done PT and finished. Accidentally dropped dose of Trileptal  for ? Time frame without a problem to 150 mg daily.  No mood swings noticed.   Made it longer than they thought I would.  Still in remission with melanoma with follow up tomorrow.   But hard to get around.  Still has bad days emotionally up to a week at a time.  Anxiety is worse than depression.  Easily overwhelmed.   Pattern of EMA at 3 am and then falls asleep watching TV.  Become a habit. Then naps.   Occ forgets Seroquel .  09/18/19 appt with the following noted: Bad day with weakness and PD.  Fell yesterday down 3-4 steps. It's  Getting worse. Not getting better on the banjo.   Started [place].  Interest in music daily.   It's so much fun. Hallucinated on amantadine  and fell and stopped. Little Ativan . No current treatment for cancer. Questions about timing of quetiapine  and gets dizzy and weak Plan: Consider reducing quetiapine  ER to minimize SE but risk increase mood problems. Currently seems to be doing ok with depression and mood swings.  Yes bc he gets up at night, REDUCE quetiapine  to ER 200 mg nightly.  01/18/20  appt with following noted: Asks for jury excuse due to Parkinson's Dz with cognitive impairment, alertness and poor memory. Tries to walk every other day.  Latley more back pain has gotten him really down.  Then can think what's the purpose of life.  His family is not supportive.  Wife and son not very helpful.  Had bad feelings about it.  Frustrated with family.  Down for a couple of weeks.  PD progressed last 6 mos and harder at times to move.   Was falling a lot 6 mos ago, but not in the last 6 mos. No difference with reduction in Seroquel  and still feels real hung over in the day. Plan: Apparently no worse with reduction quetiapine  to ER 200 mg nightly,  DT drowsiness reduce again to 150 mg ER HS  05/01/2020 appointment with the following noted:  More anxiety, but no worsening manic sx.  Sleep MN to 3 AM and then after a few days sleep more.  Same pattern as 6 mos or longer.  No one else notices any changes with reduction.  He notices no changes with PD.  The off times with PD have been more abrupt.  Takes a long time to do things.  Family just thinks he's slow. No changes with mood. 10 years ago slept 8 hour night.  Not that sleepy during the day lately. Rare lorazepam . Plan:  Reduce oxcarbazepine  to 1/2 tablet at night for 2 weeks and if sleep is unchanged then stop it. The goal is to minimize meds that might interfere with Parkinson's medications or increased risk of falling.  06/18/2020 phone call from patient's wife stating mist he was having more anxiety and panic attacks at night and wondering if it was related to med changes. MD response was that it was possible that oxcarbazepine  discontinuance could lead to more anxiety or even mania.  He should let us  know if he was having any manic symptoms.  Reviewed the purpose of the med change was to minimize drug interactions with Parkinson's disease medications and therefore rather than restarting the oxcarbazepine  we would add lorazepam  0.5 to 1  mg nightly or every 8 hours as needed anxiety.  And to let us  know if that failed.  07/02/2020 appointment with the following noted: About every night 6-7 PM get dreads.  Dreading wife going to bed and him being alone.  To the point of crying and begging wife to stay up with him.  For the last week using it regularly in the afternoon about 2 mg lorazepam .   New problems since stopping the oxcarbazepine .   PD sx are not better off the Trileptal .  Overall sx seems to get a little worse. Satisfied with meds.  No other new concerns.  Rarely uses Ativan  except when goes to doctors' offices or with CT scans Dt claustrophobia. Pt reports that mood is Anxious and describes anxiety as Moderate. Anxiety symptoms include: Excessive Worry,. Pt reports has interrupted sleep, not much change off Trileptal . Pt reports that appetite is good. Pt reports that energy is lethargic and no change. Concentration is down slightly. Suicidal thoughts:  denied by patient.  Less dep and anxiety than last visit. Melanoma responded really well to immunotherapy cancer treatment.   Discussed concerns about the cost of Seroquel  Exar and how bad he feels if he runs out of the medication. Plan: Apparently no worse with reduction quetiapine  to 150 mg ER HS . Continue it. Less drowsiness now  Call if sleep worsens or mania. Restart oxcarbazepine  to 1/2 tablet at night for 1 weeks and then 1 each evening..To stop the sense of dread and anxiety in the evening.  08/02/2020 urgent appointment with the following noted: Several recent phone calls complaining of increased anxiety unresolved by attempts to adjust medication over the phone. Worst panic attacks I've ever had, now daily.  Start early afternoon over worry he'll be alone at night bc wife in bed and he's awake.  Crying.  Can get a claustrophobic feeling and fear of not being able to move and see his feet. Near blackout spells. Out of nowhere.   Only lasts seconds.   Unrealistic  fear that he'll die despite positive exams that don't support the fear. Plan: Apparently worse with reduction quetiapine  to 150 mg ER HS bc severe anxiety with fear he'll die.  Nothing better with less medication and in fact worse. Increase quetiapine  back to ER 200 mg HS. Less drowsiness now  Call if sleep worsens or mania. Increase oxcarbazepine  to 1 tablet in AM and  each evening..To stop the sense of dread and anxiety in the evening.   08/09/2020 phone call:  RTC  Couple good days and now anxiety and panic hit and shaking and afraid to be alone tonight.  Scared of big panic attacks and doesn't want to be alone.  1 week ago increased quetiapine  ER 200 HS and oxcarb  to 150 mg 1 BID.  Tired and not enough sleep DT panic.  Afraid of being alone. Takes lorazepam  prn 2 mg .  Most days taking 2 daily.  Doesn't make him sleepy.  Took it at 4 pm today.    Increase oxcarbazepine  to 1 in AM  and 2 at 2 PM and then take 2 mg lorazepam  about an hour before anxiety worsens about 3 PM.  Disc Cog techniques.  Lorene Macintosh, MD, [facility]  09/23/2020 appointment with the following noted: Anxiety worse if anything and especially night.  No sleep since Thursday.  Sleeps in chair bc back pain.  Sleeps downstairs away from wife who goes to bed at 8 pm.  Alone a lot and doesn't like it.   Taking 2 mg lorazepam  daily for the last couple of weeks. Takes 20-30 min to kick in. Still some panic but not nightly and serious about once per week. Trouble sleeping bc is anxious and it can hit wiithot reason or build over time.  Usually comes on fast. [facility] started taking him out weekly and that helps. Trying to walk but it's gotten harder.   Rare napping. Jittery feeling in the morning. Plan: Apparently worse with reduction quetiapine  to 150 mg ER HS bc severe anxiety with fear he'll die.  Nothing better with less medication and in fact worse. Continue quetiapine  back to ER 200 mg HS vs switch to IR.  Extensive  discussion about differences. Yes for better sleep switch to IR.    FALL precautions. Take lorazepam  2 mg every evening at 6 PM. Less drowsiness now  Call if sleep worsens or mania. Continue oxcarbazepine  to 1 tablet in AM and  each evening..To stop the sense of dread and anxiety in the evening.  02/24/2021 phone call: Lorrin Pt's wife called reporting Pt is crying and saying he wants to die. Nurse contact with wife: This message was sent as high priority but pt is stable right now. After she gave him 2 of the Lorazepam  he did calm down, but remains nervous. Pt request she call. He does have apt Wednesday. Pt's wife reports pt is very anxious and nervous and doesn't like to be left alone in a room or by himself at the house. Very fearful. She reports she stayed home with him today but will be returning to work tomorrow. She reports this has been happening off and on for 2 weeks, pt is sleeping well at night. No other symptoms reported.   I instructed her okay to give lorazepam  later this evening or night if needed and sounded like while she is at work he will also need it for being alone. Informed her I would touch base with Dr. Macintosh and if he recommended anything prior to apt. She didn't feel there probably was anything.  MD response: Patient is apparently not suicidal just highly distressed.  Agree with lorazepam  2 mg 3 times daily until the appointment in 2 days. May consider clonidine   02/26/2021 appointment with the following noted: Even after Ativan  2 mg anxiety is bad and never been this bad.  Can't stand to be alone, even if wife in the house but sleeping upstairs.  Yesterday beg wife for help.  Afraid he'll die in his sleep. Only Ativan  2 mg TID once.   Kind of drugged feeling for the past few weeks. Taking Seroquel  200 mg HS, lamotrigine  200 mg daily, oxcarbazepine  150 BID, pramipexole  0.5 mg TID Sleep better with switch to Seroquel  IR 200  mg HS Plan: More panic to unmanageable degree.   More dependence behavior bc of anxiety Disc off label use of clonidine  for bipolar and anxiety. Push fluids and fall precautions Clonidine  0.1 mg tablet 1/2 at night for 2 nights, then 1/2 tablet twice daily for 4 nights, then 1/2 in the AM and 1 tablet at night for 4 nights then 1 twice daily Continue quetiapine   200 mg HS IR.   FALL precautions. Take lorazepam  2 mg TID until better with clonidine    03/21/2021 TC  complaining of feeling drugged with a combination of clonidine  and lorazepam .  He was encouraged to gradually reduce lorazepam  2 mg tablet to 1/2 tablet 3 times daily and 2 mg at bedtime.  04/18/2021 telephone note: Note RTC [id number].  Started getting depressed again about 4 days ago.  No SI but purposelessness.   04-07-21 neuro started reducing pramipexole  to switch bc hypersexual thoughts causing problems with his wife.  Right now depression worse than anxiety.  Doesn't want to be left alone.  Trouble keeping up meds.    Can't tell if clonidine  helps anxiety but is taking lorazepam  only a couple of times.  Accidentally stopped lamotrigine  at some point and doesn't know when. Restart lamotrigine  and increase to 100 mg BID  Counseled patient regarding potential benefits, risks, and side effects of Lamictal  to include potential risk of Stevens-Johnson syndrome. Advised patient to stop taking Lamictal  and contact office immediately if rash develops and to seek urgent medical attention if rash is severe and/or spreading quickly. Will start Lamictal  25 mg daily for 2 weeks, then increase to 50 mg daily for 2 weeks, then 100 mg daily for 2 weeks, then 150 mg daily for mood symptoms.   Add low dose lithium  to speed recover and for SI 300 mg daily.  He agrees to the plan  Lorene Macintosh, MD, [facility]       04/29/2021 appointment with the following noted: seen with sister Not good with more depression the last couple of weeks.  Cry daily.  Tells sister he has SI and thoughts scare him.  Doesn't  think he would do it.  Feels like he's sick and might die.  Still afraid of sleeping alone downstairs. Both scared to die and sometimes wants to die. Taking lihtium 300 mg HS, clonidine  0.1 mg BID, lamotrigine  100 BID, Trileptal  150 BID, quetiapine  200 mg HS, less lorazepam . Looking at asst living facility and it's hard to imagine it. Got a cat. Still having falls, almost daily mostly afternoon and early evenings. Plan: increase quetiapine   300 mg HS IR.    . 05/23/21 appt noted: also spoke to [facility] there and helping. Wife DM and poor self care and died in middle of night at 74 yo. Problems with sleep.  Still panic at night and afraid to be alone like before. When gets sleepy feels drugged and that gets scary to him.  Will call relatives sisters and sons trying to have someone come sit with him.   Stay on edge of panic starting about 6 PM. In process of getting MCD so he can get assisted living.  Cannot take care of himself.d Unable to get up in middle of the night for months. Plan: Switch back to quetiapine  ER 200 mg for more gradual onset and improve his ability to get up in the middle of the night.  Try to use LED  06/12/21 appt noted:  seen with sister, Andree Benne and weak 30 min before coming  to visit.  Was late with meds and that might have ben the trigger.  Fear of falling and has fallen a number of times.  Usually uses the walker.   50 yo son moving out next week and pt biggest fear is being by himself. Not sleeping much at night bc panicky when son goes to bed.  Chest heaviness.  Trying to get MCD so he can get into assisted living.  Still working on that. Sort of question what I have to live for bc losses.  No sui intent or plan.  Questions his purpose. Not much to enjoy.  Haven't read a book in 5-6 years bc poor concentration. Plan: Wean clonidine . Disc risk SSRI causing mood swings but few options left to manage anxiety so trial low dose Sertraline  25 mg daily for 1 week,  then 50 mg daily.  07/15/2021 appointment with the following noted: with sister Andree On sertraline  50 mg daily, quetiapine  XR 200 mg daily, oxcarbazepine  150 twice daily lithium  300 nightly, lamotrigine  100 mg twice daily Weaned off clonidine . Passed out the other day after getting dizzy. Often feels dizzy.  Several falls lately. Hadn't felt much different with anxiety.  Scary alone at night bc fear of falling.  Gets fear when sun starts going down.   D in law says he's had some paranoia.  She thinks it's worse in the last month.  Neighbor has checked on him that he didn't know.  He had some paranoid thoughts about the neighbor and someone else.  09/30/2021 no show  12/16/2021 appointment noted: With Aid Dana. For 4 nights stopped been off quetiapine  and started Nuplazid . No SE noticed.   Felt better for a couple of days then not the last couple of days.   Has been having sudden onset sleepiness drugged in afternoon maybe not as strong as it was but keeps him from activity.  Wants to walk outside but can't make himself do it.  Don't like being inside and less active.  This is his biggest complaint. Depression is not worse but ongoing anxiety over the last couple of months.   Sleep most of the night.   Taking lorazepam  1 mg PM only usually.  Not taking much during the day.  Aid says he is taking lorazepam  some during the day.   Aid says he intermittently wants to go to the hospital and is erratic in behavior.  Called EMS 3 times in last week bc weakness and excessive sleepiness.  .   She reports at times he will do things for himself and at times he won't do things for himself.   Still afraid to be alone.  But is often alone.  Calls people to try to get them to stay with him. Anxiety worse as evening approaches. Aid says he perked up when he knew MD was going to call. Denies SI, no SA Current psych meds Nuplazid  HS, lamotrigine  100 mg BID,lithium  300 mg HS.  Apparently off Trileptal . Plan:  Stopped Seroquel  and switched to Nuplazid  in hopes of less sleepiness.  Give nuplazid  more time.  01/02/22 appt noted:  with son, emergent work in appt On Nuplazid  about 2 weeks. Also on lithium  300, lorazepam  1 mg TID-QID, lamotrigine  100 mg daily, oxcarbazepine  150 BID.  Off Seroquel .   A change in CBA-LVO dose recently too. Not a lot of difference but a couple of good days at the beginning of the day.  But then later in day is sleepy and can't function  the rest of the day. Got very constipated and took ExLax. Has gone to PT and thinks he's doing well witgh that.   Pretty well adjusted where he is now.  Likes it pretty well and likes the people and they like him, but may have to move to asst living and dreads it.  Has a cat.  Cat can interfere with sleep and will have to get rid of it. Can go from walking around the building to [facility] within an hour.   Still has panic and some change in type of worry.  Still can get scared before dark if alone.  Will get really sad then also and will still cry at night  06/30/22 no show  09/28/22 appt noted: Going through hard time.  Confined to lift chair 22 hours daily.   Gets real anxious sundown.  Scared being by myself.  When awakens it takes an hour to get going.   Legs weak with short steps and freezing.   Anxiety is worse than depression.   Taking naps.   Caregiver administers meds.  He's not sure what he is taking.  Sister comes twice weekly.  Meds apparently : lamotrigine  100 BID, Trileptal  150 BID, lorazepam  2 mg 1/2 BID. He can't really go out of the house now DT PD.   In a retirement community.  Doesn't get any help with health.  I need additional help.   Dx shingles lately on face with pain.   Some STM.    08/04/23 appt noted:  # of NS since seen; seen with sister Andree in office Med: lamotrigine  100 BID, lorazepam  2 mg 1/2 TID, lithium  300 HS, pramipexole  0.5 TID, Lexapro  5 daily.  Carbidopa -levodopa  increased  Someone added duloxetine 30 BID for  pain, gabapentin 300 pm, mirtazapine 15 HS ER visit 06/13/23 DX anxiety and afraid to be alone.` Less sleepy with change in lorazepam  split up.  Still really anxious.   SE sleep all the time.  But erratic off and on.   [facility] and not as fearful now that other people are around. Andree reports less anxious with mirtazpine added.  But he reports still anxious esp as son goes down.   Hallucinations seemed to mostly resolve with mirtazapine added.   Andree notes he will see his wife, children, small child.  Not afraid.  I don't think it is real.  Does not cause any problems.   He's not concerned about it.   Admits talking more.  Denies impulsivity, compulsivity.  Admits he was hypersexual for awhile. Resolved.  Talked with PD doctor and he reduced Carbidop-Levodopa .  Takes ER at night and seemed to even him out more.  Got confined to room for awhile.   Andree denies any knowledge of inappropriate behaviors currently. Admits he can't take his meds without help.  Now sister is helping.   Andree comes 3 times weekly.  Facility provides meals.   Still gets scared at night as bedtime approaches.  Has intrusive thoughts he's gong to die in sleep.   PD followed by Dr. Evonnie dx about 2016 Vaccinated.  Past Psychiatric Medication Trials:  Seroquel  XR 300 Nuplazid  Trileptal , lamotrigine  100 BID Lithium  tremor Klonopin Ativan  Clonidine  0.1 BID Sertraline  ? Paranoia; 50 NR  Review of Systems:  Review of Systems  Constitutional:  Positive for fatigue.  Cardiovascular:  Negative for chest pain and palpitations.  Genitourinary:        Urinary incontinence and using Depends  Musculoskeletal:  Positive for arthralgias,  back pain and gait problem.  Neurological:  Positive for dizziness, tremors and weakness. Negative for syncope and speech difficulty.       Balance problems from PD. Falling.  Psychiatric/Behavioral:  Positive for dysphoric mood and sleep disturbance. Negative for agitation, behavioral  problems, confusion, decreased concentration, hallucinations, self-injury and suicidal ideas. The patient is nervous/anxious. The patient is not hyperactive.   Frequent falls are a problem.  Can't pick himself up without help.  Medications: I have reviewed the patient's current medications.  Current Outpatient Medications  Medication Sig Dispense Refill   carbidopa -levodopa  (SINEMET  CR) 50-200 MG tablet TAKE ONE TABLET BY MOUTH EVERY NIGHT AT BEDTIME 90 tablet 1   DULoxetine (CYMBALTA) 30 MG capsule Take 30 mg by mouth 2 (two) times daily.     gabapentin (NEURONTIN) 300 MG capsule Take 1 capsule by mouth at bedtime.     losartan  (COZAAR ) 50 MG tablet Take 50 mg by mouth daily. Pt. Is unsure of Mg.     magnesium  oxide (MAG-OX) 400 MG tablet Take 400 mg by mouth daily.     mirtazapine (REMERON) 15 MG tablet Take 15 mg by mouth at bedtime.     mupirocin  cream (BACTROBAN ) 2 % Apply 1 Application topically 2 (two) times daily. 15 g 0   niacin  500 MG tablet Take 500 mg by mouth daily with breakfast.     Omega-3 Fatty Acids (FISH OIL BURP-LESS PO) Take 1 capsule by mouth in the morning and at bedtime.     Potassium 99 MG TABS Take 1 tablet by mouth daily.     carbidopa -levodopa  (SINEMET  IR) 25-100 MG tablet TAKE 2 TABLETS BY MOUTH AT 8AM, 2 TABLETS AT NOON, 2 TABLETS AT 4PM, AND 2 TABLETS AT 8PM (Patient not taking: Reported on 08/04/2023) 340 tablet 0   lamoTRIgine  (LAMICTAL ) 100 MG tablet Take 1 tablet (100 mg total) by mouth 2 (two) times daily. 180 tablet 1   lithium  carbonate (LITHOBID ) 300 MG ER tablet Take 1 tablet (300 mg total) by mouth at bedtime. 90 tablet 1   LORazepam  (ATIVAN ) 2 MG tablet Take 0.5 tablets (1 mg total) by mouth every 8 (eight) hours as needed for anxiety. 45 tablet 3   Opicapone  50 MG CAPS Take 50 mg by mouth daily. (Patient not taking: Reported on 08/04/2023) 30 capsule 0   OXcarbazepine  (TRILEPTAL ) 150 MG tablet Take 1 tablet (150 mg total) by mouth 2 (two) times daily. 180  tablet 1   Pimavanserin  Tartrate (NUPLAZID ) 34 MG CAPS Take 1 capsule (34 mg total) by mouth at bedtime. 30 capsule 0   pramipexole  (MIRAPEX ) 0.5 MG tablet TAKE ONE TABLET BY MOUTH THREE TIMES A DAY (Patient not taking: Reported on 08/04/2023) 270 tablet 0   pravastatin  (PRAVACHOL ) 80 MG tablet Take 80 mg by mouth daily before breakfast.     valACYclovir  (VALTREX ) 1000 MG tablet Take 1 tablet (1,000 mg total) by mouth 3 (three) times daily. 21 tablet 0   No current facility-administered medications for this visit.    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 -pinkish tanno odor   Multiple body piercings    Tremor    RT HAND    Family History  Problem  Relation Age of Onset   Leukemia Mother    Healthy Sister    Healthy Sister    Healthy Son    Healthy Son    Healthy Daughter     Social History   Socioeconomic History   Marital status: Widowed    Spouse name: Not on file   Number of children: 3   Years of education: Not on file   Highest education level: 12th grade  Occupational History   Not on file  Tobacco Use   Smoking status: Never   Smokeless tobacco: Never  Vaping Use   Vaping status: Never Used  Substance and Sexual Activity   Alcohol use: No    Alcohol/week: 1.0 standard drink of alcohol    Types: 1 Glasses of wine per week    Comment: no alcohol in 2 years   Drug use: No   Sexual activity: Yes  Other Topics Concern   Not on file  Social History Narrative   Right handed   2 Story home    Lives with spouse and son   Social Drivers of Corporate investment banker Strain: Low Risk  (05/28/2023)   Received from [facility]   Overall Financial Resource Strain (CARDIA)    Difficulty of Paying Living Expenses:  Not hard at all  Food Insecurity: No Food Insecurity (05/28/2023)   Received from [facility]   Hunger Vital Sign    Within the past 12 months, you worried that your food would run out before you got the money to buy more.: Never true    Within the past 12 months, the food you bought just didn't last and you didn't have money to get more.: Never true  Transportation Needs: No Transportation Needs (05/28/2023)   Received from [facility] - Transportation    Lack of Transportation (Medical): No    Lack of Transportation (Non-Medical): No  Physical Activity: Unknown (12/22/2022)   Received from [facility]   Exercise Vital Sign    On average, how many days per week do you engage in moderate to strenuous exercise (like a brisk walk)?: 0 days    Minutes of Exercise per Session: Not on file  Stress: Stress Concern Present (12/22/2022)   Received from [facility] of Occupational [facility] - Occupational Stress Questionnaire    Feeling of Stress : Very much  Social Connections: Moderately Integrated (12/22/2022)   Received from [facility]   Social Network    How would you rate your social network (family, work, friends)?: Adequate participation with social networks  Intimate Partner Violence: Not At Risk (12/22/2022)   Received from [facility]   HITS    Over the last 12 months how often did your partner physically hurt you?: Never    Over the last 12 months how often did your partner insult you or talk down to you?: Never    Over the last 12 months how often did your partner threaten you with physical harm?: Never    Over the last 12 months how often did your partner scream or curse at you?: Never    Past Medical History, Surgical history, Social history, and Family history were reviewed and updated as appropriate.   Please see review of systems for further details on the patient's review from today.   Objective:   Physical Exam:  There were no  vitals taken for this visit.  Physical Exam Neurological:     Mental  Status: He is alert and oriented to person, place, and time.     Cranial Nerves: No dysarthria.     Motor: Weakness and tremor present.     Coordination: Coordination abnormal.     Gait: Gait abnormal.     Comments: Fidgety legs and moving around in chair.  Psychiatric:        Attention and Perception: Attention and perception normal.        Mood and Affect: Mood is anxious. Mood is not depressed.        Speech: Speech normal. Speech is not rapid and pressured or slurred.        Behavior: Behavior is not agitated or slowed. Behavior is cooperative.        Thought Content: Thought content normal. Thought content is not paranoid or delusional. Thought content does not include homicidal or suicidal ideation. Thought content does not include suicidal plan.        Cognition and Memory: Memory normal. Cognition is impaired.     Comments: Insight fair and judgment fair to poor Chronic PM anxiety Much more animated Some pressure of speech but directable.  Overinclusive. Appropriate humor.   Less depressed.      Lab Review:     Component Value Date/Time   NA 136 06/13/2023 1827   K 4.2 06/13/2023 1827   CL 103 06/13/2023 1827   CO2 23 06/13/2023 1827   GLUCOSE 125 (H) 06/13/2023 1827   BUN 28 (H) 06/13/2023 1827   CREATININE 0.76 06/13/2023 1827   CALCIUM  9.3 06/13/2023 1827   PROT 7.3 06/13/2023 1827   ALBUMIN 4.6 06/13/2023 1827   AST 18 06/13/2023 1827   ALT 10 06/13/2023 1827   ALKPHOS 87 06/13/2023 1827   BILITOT 1.0 06/13/2023 1827   GFRNONAA >60 06/13/2023 1827   GFRAA >90 10/06/2012 1130       Component Value Date/Time   WBC 6.9 06/13/2023 1827   RBC 3.99 (L) 06/13/2023 1827   HGB 13.1 06/13/2023 1827   HCT 40.6 06/13/2023 1827   PLT 225 06/13/2023 1827   MCV 101.8 (H) 06/13/2023 1827   MCH 32.8 06/13/2023 1827   MCHC 32.3 06/13/2023 1827   RDW 13.0 06/13/2023 1827   LYMPHSABS 1.1 06/13/2023  1827   MONOABS 0.6 06/13/2023 1827   EOSABS 0.2 06/13/2023 1827   BASOSABS 0.1 06/13/2023 1827    Lithium  Lvl  Date Value Ref Range Status  09/30/2021 0.22 (L) 0.60 - 1.20 mmol/L Final    Comment:    Performed at [facility], [address], [place], [place] [address]     No results found for: PHENYTOIN, PHENOBARB, VALPROATE, CBMZ   .res Assessment: Plan:    Bipolar I disorder, most recent episode depressed (HCC) - Plan: lithium  carbonate (LITHOBID ) 300 MG ER tablet, lamoTRIgine  (LAMICTAL ) 100 MG tablet, OXcarbazepine  (TRILEPTAL ) 150 MG tablet  Generalized anxiety disorder  Panic disorder with agoraphobia - Plan: LORazepam  (ATIVAN ) 2 MG tablet  Claustrophobia  Mild cognitive impairment  Insomnia due to mental condition  Parkinson's disease with dyskinesia and fluctuating manifestations (HCC)   Dependent and help seeking behaviors.  50 min face to face time with patient was spent on counseling and coordination of care. We discussed Anxiety and insomnia have improved some.  Situation is better now that he is at [facility] but they don't provide medical assistance but he is not alone all the time now.   Excessive sleepiness most likely related to Sinemet .  Chronic insomnia is better but erratic.   More panic to unmanageable degree.  More dependence behavior bc of anxiety  Call if sleep worsens or mania.  Needs asst living and is in [place] now but it is not asst living.   Sister Andree is helping now.  Disc whith her his care.    Disc risk of PD meds affecting psych status including  Risk of PD.  We discussed the short-term risks associated with benzodiazepines including sedation and increased fall risk among others.  Discussed long-term side effect risk including dependence, potential withdrawal symptoms, and the potential eventual dose-related risk of dementia.  But recent studies from 2020 dispute this association between  benzodiazepines and dementia risk. Newer studies in 2020 do not support an association with dementia.  He is taking more of this.  Counseling 20 min: on self care and getting assistance and appropriate level of assistance.  Would definitely need asst living if sister Andree isn't helping.   CBT around his fear of dying in sleep.  stopped oxcarbazepine  as previously disc Stopped Nuplazid .  Hallucinations manageable and better with mirtazapine. lorazepam  2 mg  LED prn 1/2 of 2 mg tablet TID prn  Continue mirtazapine 15 mg hs Continue duloxetine 60 mg daily. Stop Lexapro  bc duloxetine added.  No other med changes.   Disc risk duloxetine can trigger mania.  Appears a little hypomanic but not disruptive.   Patient agrees with this plan.  FU 3-4 mos  Lorene Macintosh, MD, DFAPA     Please see After Visit Summary for patient specific instructions.  No future appointments.    No orders of the defined types were placed in this encounter.      -------------------------------

## 2023-08-04 NOTE — Patient Instructions (Signed)
 Stop Lexapro  (escitalopram )

## 2023-08-23 ENCOUNTER — Other Ambulatory Visit: Payer: Self-pay

## 2023-08-23 ENCOUNTER — Emergency Department (HOSPITAL_BASED_OUTPATIENT_CLINIC_OR_DEPARTMENT_OTHER): Admitting: Radiology

## 2023-08-23 ENCOUNTER — Encounter (HOSPITAL_BASED_OUTPATIENT_CLINIC_OR_DEPARTMENT_OTHER): Payer: Self-pay

## 2023-08-23 ENCOUNTER — Emergency Department (HOSPITAL_BASED_OUTPATIENT_CLINIC_OR_DEPARTMENT_OTHER)
Admission: EM | Admit: 2023-08-23 | Discharge: 2023-08-24 | Disposition: A | Discharge status: Skilled Nursing Facility | Attending: Emergency Medicine | Admitting: Emergency Medicine

## 2023-08-23 DIAGNOSIS — W1839XA Other fall on same level, initial encounter: Secondary | ICD-10-CM | POA: Insufficient documentation

## 2023-08-23 DIAGNOSIS — G20A1 Parkinson's disease without dyskinesia, without mention of fluctuations: Secondary | ICD-10-CM | POA: Diagnosis not present

## 2023-08-23 DIAGNOSIS — S63284A Dislocation of proximal interphalangeal joint of right ring finger, initial encounter: Secondary | ICD-10-CM | POA: Diagnosis not present

## 2023-08-23 DIAGNOSIS — Y9301 Activity, walking, marching and hiking: Secondary | ICD-10-CM | POA: Insufficient documentation

## 2023-08-23 DIAGNOSIS — S80811A Abrasion, right lower leg, initial encounter: Secondary | ICD-10-CM | POA: Diagnosis not present

## 2023-08-23 DIAGNOSIS — S6991XA Unspecified injury of right wrist, hand and finger(s), initial encounter: Secondary | ICD-10-CM | POA: Diagnosis present

## 2023-08-23 DIAGNOSIS — W19XXXA Unspecified fall, initial encounter: Secondary | ICD-10-CM

## 2023-08-23 DIAGNOSIS — I4891 Unspecified atrial fibrillation: Secondary | ICD-10-CM | POA: Diagnosis not present

## 2023-08-23 DIAGNOSIS — I10 Essential (primary) hypertension: Secondary | ICD-10-CM | POA: Diagnosis not present

## 2023-08-23 DIAGNOSIS — S63259A Unspecified dislocation of unspecified finger, initial encounter: Secondary | ICD-10-CM

## 2023-08-23 LAB — CBC WITH DIFFERENTIAL/PLATELET
Abs Immature Granulocytes: 0.06 K/uL (ref 0.00–0.07)
Basophils Absolute: 0.1 K/uL (ref 0.0–0.1)
Basophils Relative: 1 %
Eosinophils Absolute: 0.2 K/uL (ref 0.0–0.5)
Eosinophils Relative: 3 %
HCT: 35.8 % — ABNORMAL LOW (ref 39.0–52.0)
Hemoglobin: 11.8 g/dL — ABNORMAL LOW (ref 13.0–17.0)
Immature Granulocytes: 1 %
Lymphocytes Relative: 13 %
Lymphs Abs: 0.9 K/uL (ref 0.7–4.0)
MCH: 32.2 pg (ref 26.0–34.0)
MCHC: 33 g/dL (ref 30.0–36.0)
MCV: 97.8 fL (ref 80.0–100.0)
Monocytes Absolute: 0.8 K/uL (ref 0.1–1.0)
Monocytes Relative: 12 %
Neutro Abs: 4.8 K/uL (ref 1.7–7.7)
Neutrophils Relative %: 70 %
Platelets: 187 K/uL (ref 150–400)
RBC: 3.66 MIL/uL — ABNORMAL LOW (ref 4.22–5.81)
RDW: 12.8 % (ref 11.5–15.5)
WBC: 6.8 K/uL (ref 4.0–10.5)
nRBC: 0 % (ref 0.0–0.2)

## 2023-08-23 LAB — COMPREHENSIVE METABOLIC PANEL WITH GFR
ALT: 13 U/L (ref 0–44)
AST: 36 U/L (ref 15–41)
Albumin: 4.5 g/dL (ref 3.5–5.0)
Alkaline Phosphatase: 109 U/L (ref 38–126)
Anion gap: 13 (ref 5–15)
BUN: 20 mg/dL (ref 8–23)
CO2: 22 mmol/L (ref 22–32)
Calcium: 9.6 mg/dL (ref 8.9–10.3)
Chloride: 106 mmol/L (ref 98–111)
Creatinine, Ser: 1.08 mg/dL (ref 0.61–1.24)
GFR, Estimated: >60 mL/min (ref >60)
Glucose, Bld: 102 mg/dL — ABNORMAL HIGH (ref 70–99)
Potassium: 4.5 mmol/L (ref 3.5–5.1)
Sodium: 142 mmol/L (ref 135–145)
Total Bilirubin: 0.5 mg/dL (ref 0.0–1.2)
Total Protein: 6.9 g/dL (ref 6.5–8.1)

## 2023-08-23 LAB — URINALYSIS, ROUTINE W REFLEX MICROSCOPIC
Bilirubin Urine: NEGATIVE
Glucose, UA: NEGATIVE mg/dL
Hgb urine dipstick: NEGATIVE
Ketones, ur: NEGATIVE mg/dL
Leukocytes,Ua: NEGATIVE
Nitrite: NEGATIVE
Protein, ur: NEGATIVE mg/dL
Specific Gravity, Urine: <1.005 — ABNORMAL LOW (ref 1.005–1.030)
pH: 6.5 (ref 5.0–8.0)

## 2023-08-23 LAB — CBG MONITORING, ED: Glucose-Capillary: 104 mg/dL — ABNORMAL HIGH (ref 70–99)

## 2023-08-23 LAB — CK: Total CK: 146 U/L (ref 49–397)

## 2023-08-23 MED ORDER — SODIUM CHLORIDE 0.9 % IV BOLUS
1000.0000 mL | Freq: Once | INTRAVENOUS | Status: AC
  Administered 2023-08-23: 1000 mL via INTRAVENOUS

## 2023-08-23 MED ORDER — BUPIVACAINE HCL (PF) 0.25 % IJ SOLN
20.0000 mL | Freq: Once | INTRAMUSCULAR | Status: AC
  Administered 2023-08-23: 20 mL
  Filled 2023-08-23: qty 30

## 2023-08-23 NOTE — ED Notes (Signed)
 Patient continues to attempt to find ride home.  Was able to reach son at work.  Son unable to come pick patient up due to being at work.

## 2023-08-23 NOTE — ED Triage Notes (Signed)
 Patient arrives via EMS due to having a mechanical fall. Patient was walking across the street and fell on the sidewalk. Patient laid on the ground for ~1 hour before he was able to wave someone down. EMS was called. Patient was found to be diaphoretic on the scene.  No LOC or head injuries.   EMS Vitals:  126/80 106 HR 16 RR CBG 184   Give 1L of NS en route   Hx of Parkinson's and repeat falls.

## 2023-08-23 NOTE — ED Notes (Signed)
 Patient able to stand with assistance.  States he has difficulty walking at night and states that standing is good for him.  Multiple attempts made to get in contact with sister.  Unable at this time

## 2023-08-23 NOTE — ED Provider Notes (Signed)
 [place] EMERGENCY DEPARTMENT AT [facility] Provider Note   CSN: [id number] Arrival date & time: 08/23/23  1600     Patient presents with: Fall and Weakness   Bill Guerrero is a 73 y.o. male.   Pt is a 73 yo male with pmhx significant for Parkinson's disease, htn, hld, and bipolar d/o.  Pt has falls all the time due to his Parkinson's disease and his ambulatory dysfunction.  Pt was walking across the street and fell on the sidewalk.  He said he was outside (very hot) for about 1 hour until someone realized he was on the ground.  He said he felt too weak to get up.  He was very sweaty when EMS arrived. He denies any pain other than his right hand which hurts from a fall about a week ago.       Prior to Admission medications   Medication Sig Start Date End Date Taking? Authorizing Provider  carbidopa -levodopa  (SINEMET  CR) 50-200 MG tablet TAKE ONE TABLET BY MOUTH EVERY NIGHT AT BEDTIME 12/12/20   [name], Asberry RAMAN, DO  carbidopa -levodopa  (SINEMET  IR) 25-100 MG tablet TAKE 2 TABLETS BY MOUTH AT 8AM, 2 TABLETS AT NOON, 2 TABLETS AT 4PM, AND 2 TABLETS AT 8PM Patient not taking: Reported on 08/04/2023 04/28/21   [name], Asberry RAMAN, DO  DULoxetine (CYMBALTA) 30 MG capsule Take 30 mg by mouth 2 (two) times daily. 06/21/23   [provider]  gabapentin (NEURONTIN) 300 MG capsule Take 1 capsule by mouth at bedtime. 04/02/23   [provider]  lamoTRIgine  (LAMICTAL ) 100 MG tablet Take 1 tablet (100 mg total) by mouth 2 (two) times daily. 08/04/23   [name], Lorene KANDICE Raddle., MD  lithium  carbonate (LITHOBID ) 300 MG ER tablet Take 1 tablet (300 mg total) by mouth at bedtime. 08/04/23   [name], Lorene KANDICE Raddle., MD  LORazepam  (ATIVAN ) 2 MG tablet Take 0.5 tablets (1 mg total) by mouth every 8 (eight) hours as needed for anxiety. 08/04/23   [name], Lorene KANDICE Raddle., MD  losartan  (COZAAR ) 50 MG tablet Take 50 mg by mouth daily. Pt. Is unsure of Mg.    [provider]  magnesium  oxide  (MAG-OX) 400 MG tablet Take 400 mg by mouth daily.    [provider]  mirtazapine (REMERON) 15 MG tablet Take 15 mg by mouth at bedtime. 06/29/23 06/23/24  [provider]  mupirocin  cream (BACTROBAN ) 2 % Apply 1 Application topically 2 (two) times daily. 09/22/22   Kingsley, Victoria K, DO  niacin  500 MG tablet Take 500 mg by mouth daily with breakfast.    [provider]  Omega-3 Fatty Acids (FISH OIL BURP-LESS PO) Take 1 capsule by mouth in the morning and at bedtime.    [provider]  Opicapone  50 MG CAPS Take 50 mg by mouth daily. Patient not taking: Reported on 08/04/2023 12/03/20   [name], Asberry RAMAN, DO  OXcarbazepine  (TRILEPTAL ) 150 MG tablet Take 1 tablet (150 mg total) by mouth 2 (two) times daily. 08/04/23   [name], Lorene KANDICE Raddle., MD  Pimavanserin  Tartrate (NUPLAZID ) 34 MG CAPS Take 1 capsule (34 mg total) by mouth at bedtime. 12/11/21   [name], Lorene KANDICE Raddle., MD  Potassium 99 MG TABS Take 1 tablet by mouth daily.    [provider]  pramipexole  (MIRAPEX ) 0.5 MG tablet TAKE ONE TABLET BY MOUTH THREE TIMES A DAY Patient not taking: Reported on 08/04/2023 04/03/21   [name], Asberry RAMAN, DO  pravastatin  (PRAVACHOL ) 80  MG tablet Take 80 mg by mouth daily before breakfast.    [provider]  valACYclovir  (VALTREX ) 1000 MG tablet Take 1 tablet (1,000 mg total) by mouth 3 (three) times daily. 09/22/22   Kingsley, Victoria K, DO    Allergies: Patient has no known allergies.    Review of Systems  Musculoskeletal:        Right hand pain  Skin:  Positive for wound.  All other systems reviewed and are negative.   Updated Vital Signs BP (!) 143/101   Pulse 89   Temp 98.2 F (36.8 C)   Resp 17   Ht 6' 2 (1.88 m)   Wt 104.3 kg   SpO2 99%   BMI 29.52 kg/m   Physical Exam Vitals and nursing note reviewed.  Constitutional:      Appearance: Normal appearance. He is obese.  HENT:     Head: Normocephalic and atraumatic.     Right Ear: External ear  normal.     Left Ear: External ear normal.     Nose: Nose normal.     Mouth/Throat:     Mouth: Mucous membranes are moist.     Pharynx: Oropharynx is clear.  Eyes:     Extraocular Movements: Extraocular movements intact.     Conjunctiva/sclera: Conjunctivae normal.     Pupils: Pupils are equal, round, and reactive to light.  Cardiovascular:     Rate and Rhythm: Normal rate and regular rhythm.     Pulses: Normal pulses.     Heart sounds: Normal heart sounds.  Pulmonary:     Effort: Pulmonary effort is normal.     Breath sounds: Normal breath sounds.  Abdominal:     General: Abdomen is flat. Bowel sounds are normal.     Palpations: Abdomen is soft.  Musculoskeletal:     Cervical back: Normal range of motion and neck supple.       Legs:     Comments: Right 4th finger swollen and tender Good ROM to right knee  Skin:    General: Skin is warm.     Capillary Refill: Capillary refill takes less than 2 seconds.  Neurological:     General: No focal deficit present.     Mental Status: He is alert and oriented to person, place, and time.     Motor: Tremor present.  Psychiatric:        Mood and Affect: Mood normal.        Behavior: Behavior normal.     (all labs ordered are listed, but only abnormal results are displayed) Labs Reviewed  CBC WITH DIFFERENTIAL/PLATELET - Abnormal; Notable for the following components:      Result Value   RBC 3.66 (*)    Hemoglobin 11.8 (*)    HCT 35.8 (*)    All other components within normal limits  COMPREHENSIVE METABOLIC PANEL WITH GFR - Abnormal; Notable for the following components:   Glucose, Bld 102 (*)    All other components within normal limits  URINALYSIS, ROUTINE W REFLEX MICROSCOPIC - Abnormal; Notable for the following components:   Color, Urine STRAW (*)    Specific Gravity, Urine <1.005 (*)    All other components within normal limits  CBG MONITORING, ED - Abnormal; Notable for the following components:   Glucose-Capillary 104  (*)    All other components within normal limits  CK    EKG: EKG Interpretation Date/Time:  Monday August 23 2023 16:15:19 EDT Ventricular Rate:  80 PR Interval:  QRS Duration:  125 QT Interval:  406 QTC Calculation: 469 R Axis:   -30  Text Interpretation: Atrial fibrillation Nonspecific intraventricular conduction delay Minimal ST depression, lateral leads Confirmed by Dean Clarity [phone]) on 08/23/2023 11:16:54 PM  Radiology: [facility] Pelvis 1-2 Views Result Date: 08/23/2023 CLINICAL DATA:  Fall.  Mechanical fall EXAM: PELVIS - 1-2 VIEW COMPARISON:  02/18/2004 FINDINGS: LEFT hip arthroplasty. No hip dislocation. No pelvic fracture. Posterior lumbar fusion noted. IMPRESSION: No evidence of pelvic fracture. Electronically Signed   By: Jackquline Boxer M.D.   On: 08/23/2023 17:48   DG Hand Complete Right Result Date: 08/23/2023 CLINICAL DATA:  Fall.  Mechanical fall EXAM: RIGHT HAND - COMPLETE 3+ VIEW COMPARISON:  None Available. FINDINGS: Dislocation of the fourth digit at the proximal interphalangeal joint. Ulnar subluxation of the middle phalanx in relation to the proximal phalanx. Small avulsion fragment adjacent to the articular surface of the middle phalanx. IMPRESSION: 1. Dislocation of the fourth digit at the proximal interphalangeal joint. 2. Small avulsion fragment adjacent to the articular surface of the middle phalanx. Electronically Signed   By: Jackquline Boxer M.D.   On: 08/23/2023 17:47   DG Chest 2 View Result Date: 08/23/2023 EXAM: 2 VIEW(S) XRAY OF THE CHEST 08/23/2023 05:37:00 PM COMPARISON: 09/30/2021 CLINICAL HISTORY: Fall. Patient arrives via EMS due to having a mechanical fall. Patient was walking across the street and fell on the sidewalk. Patient laid on the ground for ~1 hour before he was able to wave someone down. EMS was called. Patient was found to be diaphoretic on the scene. FINDINGS: LUNGS AND PLEURA: No focal pulmonary opacity. No pulmonary edema. No pleural  effusion. No pneumothorax. HEART AND MEDIASTINUM: Mild cardiomegaly. No acute abnormality of the cardiac and mediastinal silhouettes. BONES AND SOFT TISSUES: No acute osseous abnormality. Leads and wires projected over the chest. IMPRESSION: 1. No acute process. 2. Mild cardiomegaly. No congestive failure. Electronically signed by: Rockey Kilts MD 08/23/2023 05:46 PM EDT RP Workstation: [id number]   DG Knee Complete 4 Views Right Result Date: 08/23/2023 EXAM: 4 or more VIEW(S) XRAY OF THE RIGHT KNEE 08/23/2023 05:37:00 PM COMPARISON: None available. CLINICAL HISTORY: [id number] Fall [id number]. Patient arrives via EMS due to having a mechanical fall. Patient was walking across the street and fell on the sidewalk. Patient laid on the ground for ~1 hour before he was able to wave someone down. EMS was called. Patient was found to be diaphoretic on the scene. FINDINGS: BONES AND JOINTS: Subtle osseous irregularity about the tibial spines on the oblique images. No joint effusion or lipohemarthrosis to strongly suggest intraarticular fracture. Mild patellofemoral compartment joint space narrowing and subchondral sclerosis. SOFT TISSUES: The soft tissues are unremarkable. IMPRESSION: 1. Subtle osseous irregularity about the intertibial spines, possibly degenerative. If high clinical concern of subtle intraarticular fracture, consider CT. Electronically signed by: Rockey Kilts MD 08/23/2023 05:45 PM EDT RP Workstation: [id number]     .Reduction of dislocation  Date/Time: 08/23/2023 11:14 PM  Performed by: Dean Clarity, MD Authorized by: Dean Clarity, MD  Consent: Verbal consent obtained Consent given by: patient Patient understanding: patient states understanding of the procedure being performed Patient identity confirmed: verbally with patient Time out: Immediately prior to procedure a time out was called to verify the correct patient, procedure, equipment, support staff and site/side marked as  required. Local anesthesia used: yes Anesthesia: digital block  Anesthesia: Local anesthesia used: yes Local Anesthetic: bupivacaine  0.25% without epinephrine  Anesthetic total: 5 mL  Sedation: Patient sedated: no  Patient tolerance: patient tolerated the procedure well with no immediate complications Comments: Dislocation unable to be reduced.  I feel the bone reducing, but it won't stay in place.      Medications Ordered in the ED  sodium chloride  0.9 % bolus 1,000 mL (0 mLs Intravenous Stopped 08/23/23 1818)  bupivacaine  (PF) (MARCAINE ) 0.25 % injection 20 mL (20 mLs Infiltration Given by Other 08/23/23 1825)                                    Medical Decision Making Amount and/or Complexity of Data Reviewed Labs: ordered. Radiology: ordered.  Risk Prescription drug management.   This patient presents to the ED for concern of fall, this involves an extensive number of treatment options, and is a complaint that carries with it a high risk of complications and morbidity.  The differential diagnosis includes multiple trauma   Co morbidities that complicate the patient evaluation  Parkinson's disease, htn, hld, and bipolar d/o   Additional history obtained:  Additional history obtained from epic chart review External records from outside source obtained and reviewed including EMS report   Lab Tests:  I Ordered, and personally interpreted labs.  The pertinent results include:  ua nl, ck nl, cbc with hgb sl low at 11.8; cmp nl   Imaging Studies ordered:  I ordered imaging studies including cxr, pelvis, right hand, right knee  I independently visualized and interpreted imaging which showed  Pelvis: No evidence of pelvic fracture.  Chest: . No acute process.  2. Mild cardiomegaly. No congestive failure.   R Hand:  Dislocation of the fourth digit at the proximal interphalangeal  joint.  2. Small avulsion fragment adjacent to the articular surface of the   middle phalanx.   R knee: 1. Subtle osseous irregularity about the intertibial spines, possibly  degenerative. If high clinical concern of subtle intraarticular fracture,  consider CT.   I agree with the radiologist interpretation   Cardiac Monitoring:  The patient was maintained on a cardiac monitor.  I personally viewed and interpreted the cardiac monitored which showed an underlying rhythm of: possible afib.  Pt is also very tremulous.   Medicines ordered and prescription drug management:  I ordered medication including ivfs  for sx  Reevaluation of the patient after these medicines showed that the patient improved I have reviewed the patients home medicines and have made adjustments as needed   Test Considered:  ct   Consultations Obtained:  I requested consultation with the hand surgeon (Dr. Alyse),  and discussed lab and imaging findings as well as pertinent plan he will see pt on Wed.   Problem List / ED Course:  Frequent falls:  likely due to Parkinson's disease New onset afib:  pt is not a candidate for blood thinners as he falls all the time.  Pt referred to cards.  Rate is controlled. Dislocated right finger:  I am unable to reduce.  Pt to f/u with hand.   Reevaluation:  After the interventions noted above, I reevaluated the patient and found that they have :improved   Social Determinants of Health:  Lives at a retirement home   Dispostion:  After consideration of the diagnostic results and the patients response to treatment, I feel that the patent would benefit from discharge with outpatient f/u.       Final diagnoses:  Fall, initial encounter  Dislocation of finger, initial encounter  Atrial fibrillation, unspecified type [facility])    ED Discharge Orders          Ordered    Amb Referral to AFIB Clinic        08/23/23 2317               Dean Clarity, MD 08/23/23 2320

## 2023-08-24 NOTE — ED Notes (Signed)
[facility] at bedside to transport patient back to facility.  

## 2023-09-02 ENCOUNTER — Other Ambulatory Visit: Payer: Self-pay | Admitting: Psychiatry

## 2023-09-02 DIAGNOSIS — F411 Generalized anxiety disorder: Secondary | ICD-10-CM

## 2023-09-02 DIAGNOSIS — F4001 Agoraphobia with panic disorder: Secondary | ICD-10-CM

## 2023-09-10 ENCOUNTER — Other Ambulatory Visit: Payer: Self-pay | Admitting: Psychiatry

## 2023-09-10 DIAGNOSIS — F3130 Bipolar disorder, current episode depressed, mild or moderate severity, unspecified: Secondary | ICD-10-CM

## 2023-09-19 IMAGING — DX DG SHOULDER 2+V*R*
3 series · 3 of 3 positions shown · non-contrast
Comparison: Right upper extremity radiograph dated 04/30/2021.

CLINICAL DATA: Fall and trauma to the right shoulder.

EXAM:
RIGHT SHOULDER - 2+ VIEW

[shoulder ap (1 of 2)]
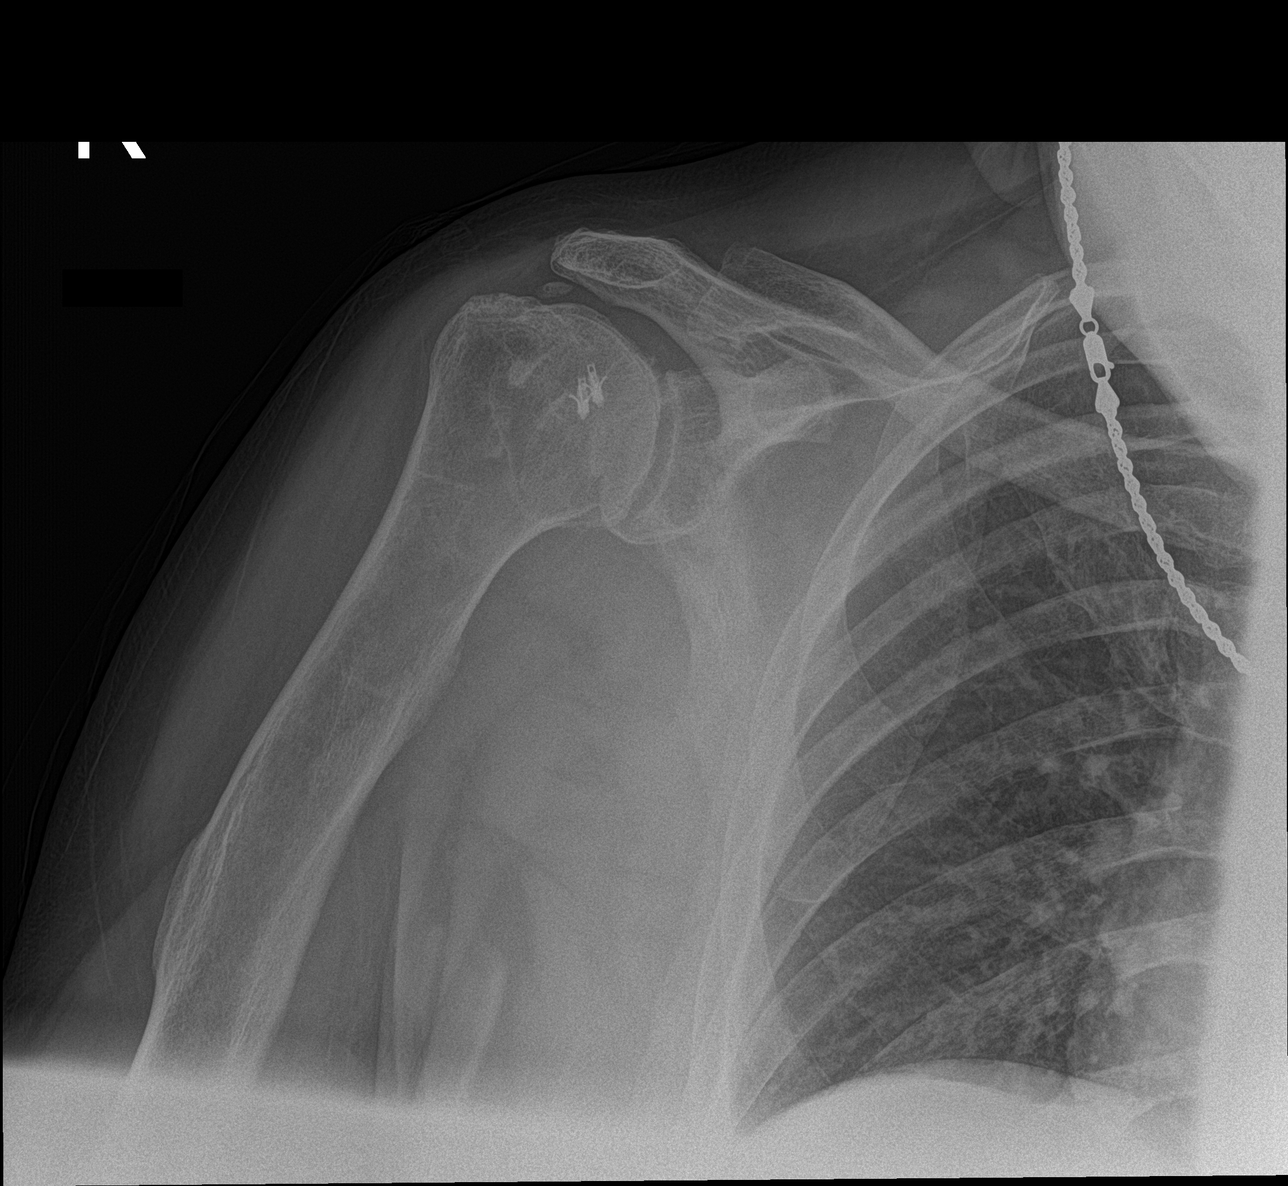

[shoulder obl]
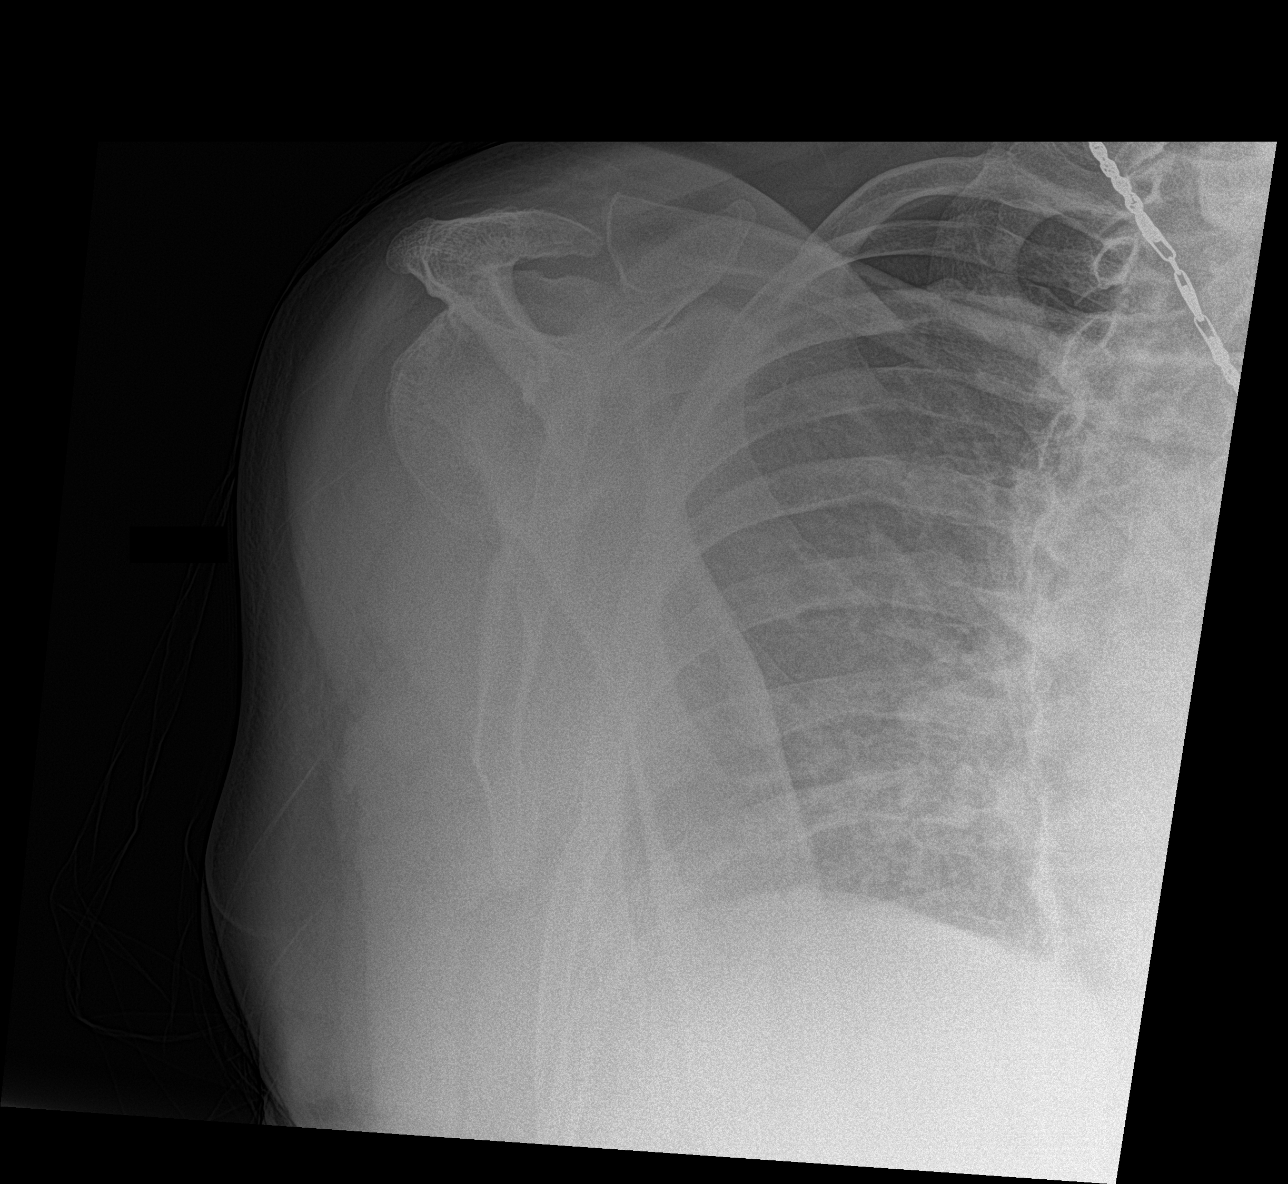

[shoulder ap (2 of 2)]
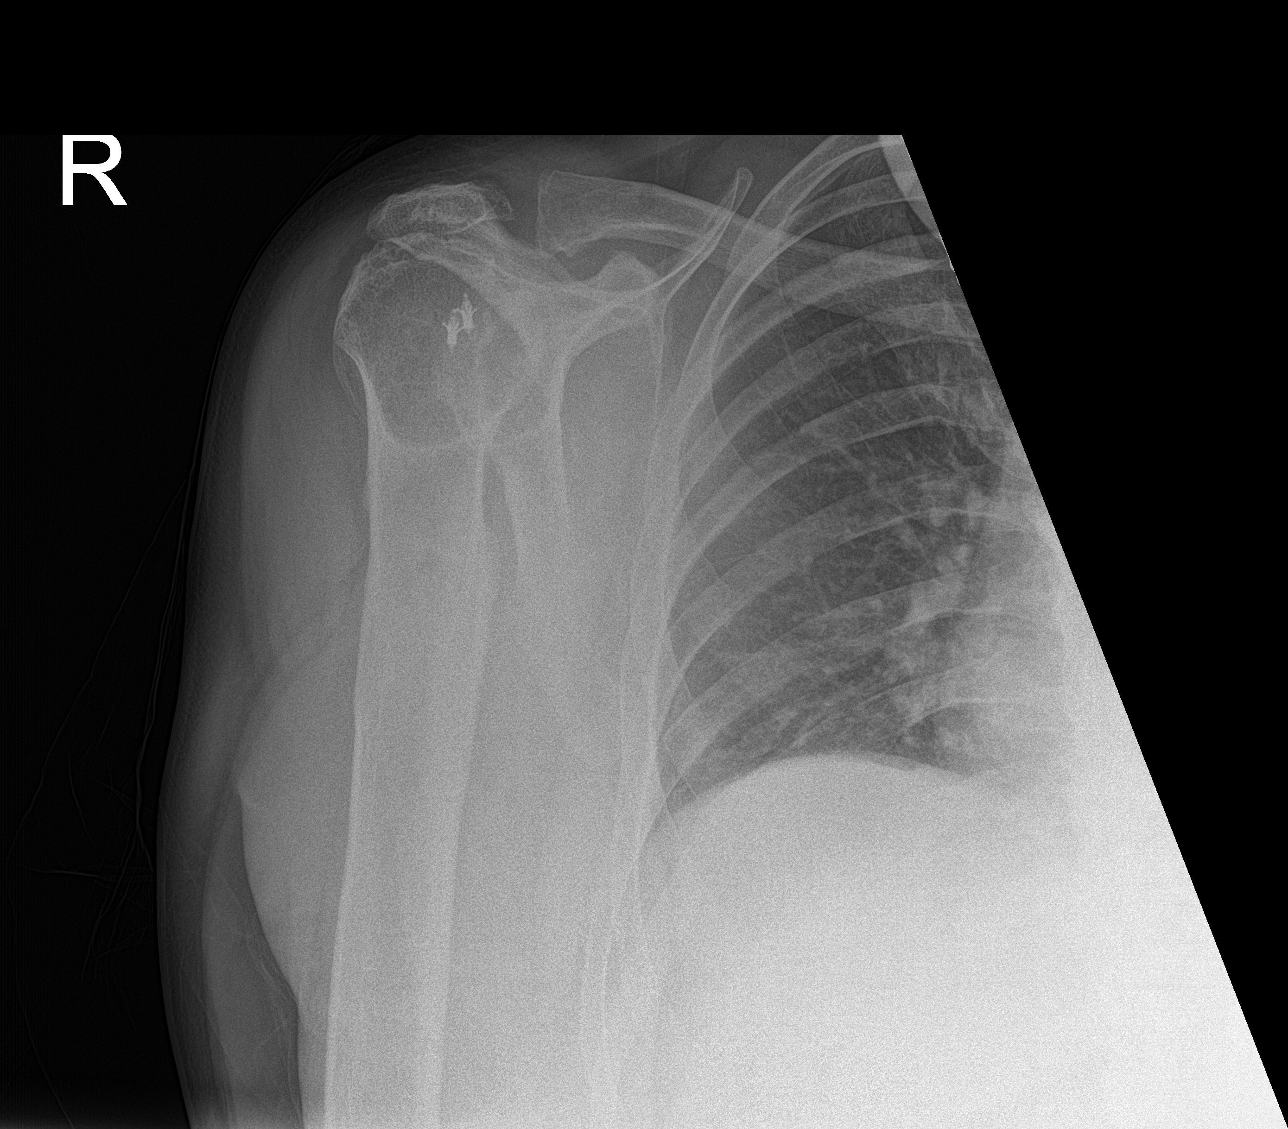

[3 of 3 positions shown; findings below may reference images not displayed]

FINDINGS: There is no acute fracture or dislocation. Old healed fracture
deformity of the humeral diaphysis. The bones are osteopenic. There
is degenerative changes of the right shoulder with joint space
narrowing. Rotator cuff anchor pins noted. The soft tissues are
unremarkable.
IMPRESSION: 1. No acute fracture or dislocation.
2. Osteopenia and degenerative changes.

## 2023-12-06 ENCOUNTER — Ambulatory Visit: Admitting: Psychiatry

## 2023-12-06 ENCOUNTER — Encounter: Payer: Self-pay | Admitting: Psychiatry

## 2023-12-06 DIAGNOSIS — F40240 Claustrophobia: Secondary | ICD-10-CM

## 2023-12-06 DIAGNOSIS — G3184 Mild cognitive impairment, so stated: Secondary | ICD-10-CM

## 2023-12-06 DIAGNOSIS — F4001 Agoraphobia with panic disorder: Secondary | ICD-10-CM | POA: Diagnosis not present

## 2023-12-06 DIAGNOSIS — F411 Generalized anxiety disorder: Secondary | ICD-10-CM | POA: Diagnosis not present

## 2023-12-06 DIAGNOSIS — G20B2 Parkinson's disease with dyskinesia, with fluctuations: Secondary | ICD-10-CM

## 2023-12-06 DIAGNOSIS — F3130 Bipolar disorder, current episode depressed, mild or moderate severity, unspecified: Secondary | ICD-10-CM | POA: Diagnosis not present

## 2023-12-06 DIAGNOSIS — F5105 Insomnia due to other mental disorder: Secondary | ICD-10-CM

## 2023-12-06 MED ORDER — OXCARBAZEPINE 150 MG PO TABS
ORAL_TABLET | ORAL | Status: AC
Start: 2023-12-06 — End: ?

## 2023-12-06 NOTE — Progress Notes (Signed)
 Bill Guerrero [id number] [birth year] 73 y.o.  Virtual Visit via Telephone Note  I connected with pt by telephone and verified that I am speaking with the correct person using two identifiers.   I discussed the limitations, risks, security and privacy concerns of performing an evaluation and management service by telephone and the availability of in person appointments. I also discussed with the patient that there may be a patient responsible charge related to this service. The patient expressed understanding and agreed to proceed.  I discussed the assessment and treatment plan with the patient. The patient was provided an opportunity to ask questions and all were answered. The patient agreed with the plan and demonstrated an understanding of the instructions.   The patient was advised to call back or seek an in-person evaluation if the symptoms worsen or if the condition fails to improve as anticipated.  I provided 30 minutes of non-face-to-face time during this encounter. The call started at 530 and ended at 600. The patient was located at home and the provider was located office.   Subjective:   Patient ID:  Bill Guerrero is a 73 y.o. (DOB Aug 02, 1950) male.  Chief Complaint:  Chief Complaint  Patient presents with   Follow-up   Depression   Anxiety   Hallucinations   Sleeping Problem    HPI Bill Guerrero presents to the office today for follow-up of mood disorder, anxiety, and chronic insomnia.  He is chronically stressed and down due to the combination of dealing with Parkinson's disease and history of cancer.  seen in march 2021.  No meds were changed. The following was noted: Started playing banjo and taking lessons.  Practices daily.   Stress many things he can no longer do physically.  Can't do housework like before.  Getting him down.  Pushing himself to walk with a walker.  Pain in walking without walker.   Has done PT and finished. Accidentally dropped  dose of Trileptal  for ? Time frame without a problem to 150 mg daily.  No mood swings noticed.   Made it longer than they thought I would.  Still in remission with melanoma with follow up tomorrow.   But hard to get around.  Still has bad days emotionally up to a week at a time.  Anxiety is worse than depression.  Easily overwhelmed.   Pattern of EMA at 3 am and then falls asleep watching TV.  Become a habit. Then naps.   Occ forgets Seroquel .  09/18/19 appt with the following noted: Bad day with weakness and PD.  Fell yesterday down 3-4 steps. It's  Getting worse. Not getting better on the banjo.   Started [place].  Interest in music daily.   It's so much fun. Hallucinated on amantadine  and fell and stopped. Little Ativan . No current treatment for cancer. Questions about timing of quetiapine  and gets dizzy and weak Plan: Consider reducing quetiapine  ER to minimize SE but risk increase mood problems. Currently seems to be doing ok with depression and mood swings.  Yes bc he gets up at night, REDUCE quetiapine  to ER 200 mg nightly.  01/18/20 appt with following noted: Asks for jury excuse due to Parkinson's Dz with cognitive impairment, alertness and poor memory. Tries to walk every other day.  Latley more back pain has gotten him really down.  Then can think what's the purpose of life.  His family is not supportive.  Wife and son not very helpful.  Had bad feelings about it.  Frustrated with family.  Down for a couple of weeks.  PD progressed last 6 mos and harder at times to move.   Was falling a lot 6 mos ago, but not in the last 6 mos. No difference with reduction in Seroquel  and still feels real hung over in the day. Plan: Apparently no worse with reduction quetiapine  to ER 200 mg nightly,  DT drowsiness reduce again to 150 mg ER HS  05/01/2020 appointment with the following noted:  More anxiety, but no worsening manic sx.  Sleep MN to 3 AM and then after a few days sleep more.  Same  pattern as 6 mos or longer.  No one else notices any changes with reduction.  He notices no changes with PD.  The off times with PD have been more abrupt.  Takes a long time to do things.  Family just thinks he's slow. No changes with mood. 10 years ago slept 8 hour night.  Not that sleepy during the day lately. Rare lorazepam . Plan: Reduce oxcarbazepine  to 1/2 tablet at night for 2 weeks and if sleep is unchanged then stop it. The goal is to minimize meds that might interfere with Parkinson's medications or increased risk of falling.  06/18/2020 phone call from patient's wife stating mist he was having more anxiety and panic attacks at night and wondering if it was related to med changes. MD response was that it was possible that oxcarbazepine  discontinuance could lead to more anxiety or even mania.  He should let us  know if he was having any manic symptoms.  Reviewed the purpose of the med change was to minimize drug interactions with Parkinson's disease medications and therefore rather than restarting the oxcarbazepine  we would add lorazepam  0.5 to 1 mg nightly or every 8 hours as needed anxiety.  And to let us  know if that failed.  07/02/2020 appointment with the following noted: About every night 6-7 PM get dreads.  Dreading wife going to bed and him being alone.  To the point of crying and begging wife to stay up with him.  For the last week using it regularly in the afternoon about 2 mg lorazepam .   New problems since stopping the oxcarbazepine .   PD sx are not better off the Trileptal .  Overall sx seems to get a little worse. Satisfied with meds.  No other new concerns.  Rarely uses Ativan  except when goes to doctors' offices or with CT scans Dt claustrophobia. Pt reports that mood is Anxious and describes anxiety as Moderate. Anxiety symptoms include: Excessive Worry,. Pt reports has interrupted sleep, not much change off Trileptal . Pt reports that appetite is good. Pt reports that energy is  lethargic and no change. Concentration is down slightly. Suicidal thoughts:  denied by patient.  Less dep and anxiety than last visit. Melanoma responded really well to immunotherapy cancer treatment.   Discussed concerns about the cost of Seroquel  Exar and how bad he feels if he runs out of the medication. Plan: Apparently no worse with reduction quetiapine  to 150 mg ER HS . Continue it. Less drowsiness now  Call if sleep worsens or mania. Restart oxcarbazepine  to 1/2 tablet at night for 1 weeks and then 1 each evening..To stop the sense of dread and anxiety in the evening.  08/02/2020 urgent appointment with the following noted: Several recent phone calls complaining of increased anxiety unresolved by attempts to adjust medication over the phone. Worst panic attacks I've ever had, now daily.  Start early afternoon over  worry he'll be alone at night bc wife in bed and he's awake.  Crying.  Can get a claustrophobic feeling and fear of not being able to move and see his feet. Near blackout spells. Out of nowhere.   Only lasts seconds.   Unrealistic fear that he'll die despite positive exams that don't support the fear. Plan: Apparently worse with reduction quetiapine  to 150 mg ER HS bc severe anxiety with fear he'll die.  Nothing better with less medication and in fact worse. Increase quetiapine  back to ER 200 mg HS. Less drowsiness now  Call if sleep worsens or mania. Increase oxcarbazepine  to 1 tablet in AM and  each evening..To stop the sense of dread and anxiety in the evening.   08/09/2020 phone call:  RTC  Couple good days and now anxiety and panic hit and shaking and afraid to be alone tonight.  Scared of big panic attacks and doesn't want to be alone.  1 week ago increased quetiapine  ER 200 HS and oxcarb to 150 mg 1 BID.  Tired and not enough sleep DT panic.  Afraid of being alone. Takes lorazepam  prn 2 mg .  Most days taking 2 daily.  Doesn't make him sleepy.  Took it at 4 pm today.     Increase oxcarbazepine  to 1 in AM  and 2 at 2 PM and then take 2 mg lorazepam  about an hour before anxiety worsens about 3 PM.  Disc Cog techniques.  Lorene Macintosh, MD, [facility]  09/23/2020 appointment with the following noted: Anxiety worse if anything and especially night.  No sleep since Thursday.  Sleeps in chair bc back pain.  Sleeps downstairs away from wife who goes to bed at 8 pm.  Alone a lot and doesn't like it.   Taking 2 mg lorazepam  daily for the last couple of weeks. Takes 20-30 min to kick in. Still some panic but not nightly and serious about once per week. Trouble sleeping bc is anxious and it can hit wiithot reason or build over time.  Usually comes on fast. [facility] started taking him out weekly and that helps. Trying to walk but it's gotten harder.   Rare napping. Jittery feeling in the morning. Plan: Apparently worse with reduction quetiapine  to 150 mg ER HS bc severe anxiety with fear he'll die.  Nothing better with less medication and in fact worse. Continue quetiapine  back to ER 200 mg HS vs switch to IR.  Extensive discussion about differences. Yes for better sleep switch to IR.    FALL precautions. Take lorazepam  2 mg every evening at 6 PM. Less drowsiness now  Call if sleep worsens or mania. Continue oxcarbazepine  to 1 tablet in AM and  each evening..To stop the sense of dread and anxiety in the evening.  02/24/2021 phone call: Lorrin Pt's wife called reporting Pt is crying and saying he wants to die. Nurse contact with wife: This message was sent as high priority but pt is stable right now. After she gave him 2 of the Lorazepam  he did calm down, but remains nervous. Pt request she call. He does have apt Wednesday. Pt's wife reports pt is very anxious and nervous and doesn't like to be left alone in a room or by himself at the house. Very fearful. She reports she stayed home with him today but will be returning to work tomorrow. She reports this has been happening off  and on for 2 weeks, pt is sleeping well at night. No other symptoms  reported.   I instructed her okay to give lorazepam  later this evening or night if needed and sounded like while she is at work he will also need it for being alone. Informed her I would touch base with Dr. Geoffry and if he recommended anything prior to apt. She didn't feel there probably was anything.  MD response: Patient is apparently not suicidal just highly distressed.  Agree with lorazepam  2 mg 3 times daily until the appointment in 2 days. May consider clonidine   02/26/2021 appointment with the following noted: Even after Ativan  2 mg anxiety is bad and never been this bad.  Can't stand to be alone, even if wife in the house but sleeping upstairs.  Yesterday beg wife for help.  Afraid he'll die in his sleep. Only Ativan  2 mg TID once.   Kind of drugged feeling for the past few weeks. Taking Seroquel  200 mg HS, lamotrigine  200 mg daily, oxcarbazepine  150 BID, pramipexole  0.5 mg TID Sleep better with switch to Seroquel  IR 200 mg HS Plan: More panic to unmanageable degree.  More dependence behavior bc of anxiety Disc off label use of clonidine  for bipolar and anxiety. Push fluids and fall precautions Clonidine  0.1 mg tablet 1/2 at night for 2 nights, then 1/2 tablet twice daily for 4 nights, then 1/2 in the AM and 1 tablet at night for 4 nights then 1 twice daily Continue quetiapine   200 mg HS IR.   FALL precautions. Take lorazepam  2 mg TID until better with clonidine    03/21/2021 TC  complaining of feeling drugged with a combination of clonidine  and lorazepam .  He was encouraged to gradually reduce lorazepam  2 mg tablet to 1/2 tablet 3 times daily and 2 mg at bedtime.  04/18/2021 telephone note: Note RTC [phone].  Started getting depressed again about 4 days ago.  No SI but purposelessness.   04-07-21 neuro started reducing pramipexole  to switch bc hypersexual thoughts causing problems with his wife.  Right now depression  worse than anxiety.  Doesn't want to be left alone.  Trouble keeping up meds.    Can't tell if clonidine  helps anxiety but is taking lorazepam  only a couple of times.  Accidentally stopped lamotrigine  at some point and doesn't know when. Restart lamotrigine  and increase to 100 mg BID  Counseled patient regarding potential benefits, risks, and side effects of Lamictal  to include potential risk of Stevens-Johnson syndrome. Advised patient to stop taking Lamictal  and contact office immediately if rash develops and to seek urgent medical attention if rash is severe and/or spreading quickly. Will start Lamictal  25 mg daily for 2 weeks, then increase to 50 mg daily for 2 weeks, then 100 mg daily for 2 weeks, then 150 mg daily for mood symptoms.   Add low dose lithium  to speed recover and for SI 300 mg daily.  He agrees to the plan  Lorene Geoffry, MD, [facility] [place] [facility]       04/29/2021 appointment with the following noted: seen with sister Not good with more depression the last couple of weeks.  Cry daily.  Tells sister he has SI and thoughts scare him.  Doesn't think he would do it.  Feels like he's sick and might die.  Still afraid of sleeping alone downstairs. Both scared to die and sometimes wants to die. Taking lihtium 300 mg HS, clonidine  0.1 mg BID, lamotrigine  100 BID, Trileptal  150 BID, quetiapine  200 mg HS, less lorazepam . Looking at asst living facility and it's hard to imagine it. Got a cat.  Still having falls, almost daily mostly afternoon and early evenings. Plan: increase quetiapine   300 mg HS IR.    . 05/23/21 appt noted: also spoke to [facility] there and helping. Wife DM and poor self care and died in middle of night at 74 yo. Problems with sleep.  Still panic at night and afraid to be alone like before. When gets sleepy feels drugged and that gets scary to him.  Will call relatives sisters and sons trying to have someone come sit with him.   Stay on edge of panic starting about 6 PM. In process  of getting MCD so he can get assisted living.  Cannot take care of himself.d Unable to get up in middle of the night for months. Plan: Switch back to quetiapine  ER 200 mg for more gradual onset and improve his ability to get up in the middle of the night.  Try to use LED  06/12/21 appt noted:  seen with sister, Andree Benne and weak 30 min before coming to visit.  Was late with meds and that might have ben the trigger.  Fear of falling and has fallen a number of times.  Usually uses the walker.   7 yo son moving out next week and pt biggest fear is being by himself. Not sleeping much at night bc panicky when son goes to bed.  Chest heaviness.  Trying to get MCD so he can get into assisted living.  Still working on that. Sort of question what I have to live for bc losses.  No sui intent or plan.  Questions his purpose. Not much to enjoy.  Haven't read a book in 5-6 years bc poor concentration. Plan: Wean clonidine . Disc risk SSRI causing mood swings but few options left to manage anxiety so trial low dose Sertraline  25 mg daily for 1 week, then 50 mg daily.  07/15/2021 appointment with the following noted: with sister Andree On sertraline  50 mg daily, quetiapine  XR 200 mg daily, oxcarbazepine  150 twice daily lithium  300 nightly, lamotrigine  100 mg twice daily Weaned off clonidine . Passed out the other day after getting dizzy. Often feels dizzy.  Several falls lately. Hadn't felt much different with anxiety.  Scary alone at night bc fear of falling.  Gets fear when sun starts going down.   D in law says he's had some paranoia.  She thinks it's worse in the last month.  Neighbor has checked on him that he didn't know.  He had some paranoid thoughts about the neighbor and someone else.  09/30/2021 no show  12/16/2021 appointment noted: With Aid Dana. For 4 nights stopped been off quetiapine  and started Nuplazid . No SE noticed.   Felt better for a couple of days then not the last couple of days.    Has been having sudden onset sleepiness drugged in afternoon maybe not as strong as it was but keeps him from activity.  Wants to walk outside but can't make himself do it.  Don't like being inside and less active.  This is his biggest complaint. Depression is not worse but ongoing anxiety over the last couple of months.   Sleep most of the night.   Taking lorazepam  1 mg PM only usually.  Not taking much during the day.  Aid says he is taking lorazepam  some during the day.   Aid says he intermittently wants to go to the hospital and is erratic in behavior.  Called EMS 3 times in last week bc weakness and excessive  sleepiness.  .   She reports at times he will do things for himself and at times he won't do things for himself.   Still afraid to be alone.  But is often alone.  Calls people to try to get them to stay with him. Anxiety worse as evening approaches. Aid says he perked up when he knew MD was going to call. Denies SI, no SA Current psych meds Nuplazid  HS, lamotrigine  100 mg BID,lithium  300 mg HS.  Apparently off Trileptal . Plan: Stopped Seroquel  and switched to Nuplazid  in hopes of less sleepiness.  Give nuplazid  more time.  01/02/22 appt noted:  with son, emergent work in appt On Nuplazid  about 2 weeks. Also on lithium  300, lorazepam  1 mg TID-QID, lamotrigine  100 mg daily, oxcarbazepine  150 BID.  Off Seroquel .   A change in CBA-LVO dose recently too. Not a lot of difference but a couple of good days at the beginning of the day.  But then later in day is sleepy and can't function the rest of the day. Got very constipated and took ExLax. Has gone to PT and thinks he's doing well witgh that.   Pretty well adjusted where he is now.  Likes it pretty well and likes the people and they like him, but may have to move to asst living and dreads it.  Has a cat.  Cat can interfere with sleep and will have to get rid of it. Can go from walking around the building to [facility] within an hour.   Still has  panic and some change in type of worry.  Still can get scared before dark if alone.  Will get really sad then also and will still cry at night  06/30/22 no show  09/28/22 appt noted: Going through hard time.  Confined to lift chair 22 hours daily.   Gets real anxious sundown.  Scared being by myself.  When awakens it takes an hour to get going.   Legs weak with short steps and freezing.   Anxiety is worse than depression.   Taking naps.   Caregiver administers meds.  He's not sure what he is taking.  Sister comes twice weekly.  Meds apparently : lamotrigine  100 BID, Trileptal  150 BID, lorazepam  2 mg 1/2 BID. He can't really go out of the house now DT PD.   In a retirement community.  Doesn't get any help with health.  I need additional help.   Dx shingles lately on face with pain.   Some STM.    08/04/23 appt noted:  # of NS since seen; seen with sister Andree in office Med: lamotrigine  100 BID, lorazepam  2 mg 1/2 TID, lithium  300 HS, pramipexole  0.5 TID, Lexapro  5 daily.  Carbidopa -levodopa  increased  Someone added duloxetine 30 BID for pain, gabapentin 300 pm, mirtazapine 15 HS ER visit 06/13/23 DX anxiety and afraid to be alone.` Less sleepy with change in lorazepam  split up.  Still really anxious.   SE sleep all the time.  But erratic off and on.   [facility] and not as fearful now that other people are around. Andree reports less anxious with mirtazpine added.  But he reports still anxious esp as son goes down.   Hallucinations seemed to mostly resolve with mirtazapine added.   Andree notes he will see his wife, children, small child.  Not afraid.  I don't think it is real.  Does not cause any problems.   He's not concerned about it.   Admits talking more.  Denies impulsivity, compulsivity.  Admits he was hypersexual for awhile. Resolved.  Talked with PD doctor and he reduced Carbidop-Levodopa .  Takes ER at night and seemed to even him out more.  Got confined to room for awhile.   Andree  denies any knowledge of inappropriate behaviors currently. Admits he can't take his meds without help.  Now sister is helping.   Andree comes 3 times weekly.  Facility provides meals.   Still gets scared at night as bedtime approaches.  Has intrusive thoughts he's gong to die in sleep.  Plan: lorazepam  2 mg  LED prn 1/2 of 2 mg tablet TID prn  Continue mirtazapine 15 mg hs Continue duloxetine 60 mg daily. Stop Lexapro  bc duloxetine added.  No other med changes.    12/06/23 appt noted:  phone with sister Andree Med: Duloxetine 30 mg twice daily, gabapentin 300 nightly, lamotrigine  100 mg twice daily, Lithobid  300 mg nightly, lorazepam  2 mg tablets, 1/2 tablet BID, mirtazapine 15 nightly;  resumed oxcarbazepine  150 mg BID. Occ visual hallucinations of kids but not often.   I know they're not real; they don't bother me.   Lately anxiety is worse but not as bad as it was. Esp at dark as noted before.  More dep lately than in a while.  Sleep all the time now.  Lonely a lot.  Afraid of dying alone.  Is checked on some about meals.   Too out of it at noon to eat lunch.   Will question why is he here.  Kids avoid him.  Negative thoughts get him down.  Lost his independence.  Useless, helpless, hopeless.  Question the purpose of his life bc health limitations.   PD followed by Dr. Evonnie dx about 2016 Vaccinated.  Past Psychiatric Medication Trials:  Seroquel  XR 300 Nuplazid  Trileptal , lamotrigine  100 BID Lithium  tremor Klonopin Ativan  Clonidine  0.1 BID Sertraline  ? Paranoia; 50 NR  Review of Systems:  Review of Systems  Constitutional:  Positive for fatigue.  Cardiovascular:  Negative for chest pain and palpitations.  Genitourinary:        Urinary incontinence and using Depends  Musculoskeletal:  Positive for arthralgias, back pain and gait problem.  Neurological:  Positive for dizziness, tremors and weakness. Negative for syncope and speech difficulty.       Balance problems from  PD. Falling.  Psychiatric/Behavioral:  Positive for dysphoric mood and sleep disturbance. Negative for agitation, behavioral problems, confusion, decreased concentration, hallucinations, self-injury and suicidal ideas. The patient is nervous/anxious. The patient is not hyperactive.   Frequent falls are a problem.  Can't pick himself up without help.  Medications: I have reviewed the patient's current medications.  Current Outpatient Medications  Medication Sig Dispense Refill   carbidopa -levodopa  (SINEMET  CR) 50-200 MG tablet TAKE ONE TABLET BY MOUTH EVERY NIGHT AT BEDTIME 90 tablet 1   carbidopa -levodopa  (SINEMET  IR) 25-100 MG tablet TAKE 2 TABLETS BY MOUTH AT 8AM, 2 TABLETS AT NOON, 2 TABLETS AT 4PM, AND 2 TABLETS AT 8PM 340 tablet 0   DULoxetine (CYMBALTA) 30 MG capsule Take 30 mg by mouth 2 (two) times daily.     gabapentin (NEURONTIN) 300 MG capsule Take 1 capsule by mouth at bedtime.     lamoTRIgine  (LAMICTAL ) 100 MG tablet Take 1 tablet (100 mg total) by mouth 2 (two) times daily. 180 tablet 1   lithium  carbonate (LITHOBID ) 300 MG ER tablet TAKE 1 TABLET BY MOUTH AT BEDTIME 90 tablet 0   LORazepam  (ATIVAN ) 2 MG tablet Take  0.5 tablets (1 mg total) by mouth every 8 (eight) hours as needed for anxiety. (Patient taking differently: Take 1 mg by mouth every 8 (eight) hours as needed for anxiety. BID) 45 tablet 3   losartan  (COZAAR ) 50 MG tablet Take 50 mg by mouth daily. Pt. Is unsure of Mg.     magnesium  oxide (MAG-OX) 400 MG tablet Take 400 mg by mouth daily.     mirtazapine (REMERON) 15 MG tablet Take 15 mg by mouth at bedtime.     mupirocin  cream (BACTROBAN ) 2 % Apply 1 Application topically 2 (two) times daily. 15 g 0   niacin  500 MG tablet Take 500 mg by mouth daily with breakfast.     Omega-3 Fatty Acids (FISH OIL BURP-LESS PO) Take 1 capsule by mouth in the morning and at bedtime.     Potassium 99 MG TABS Take 1 tablet by mouth daily.     pravastatin  (PRAVACHOL ) 80 MG tablet Take 80  mg by mouth daily before breakfast.     valACYclovir  (VALTREX ) 1000 MG tablet Take 1 tablet (1,000 mg total) by mouth 3 (three) times daily. 21 tablet 0   OXcarbazepine  (TRILEPTAL ) 150 MG tablet 1 tablet nightly for 15 days and then stop it.     No current facility-administered medications for this visit.    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 -pinkish tanno odor   Multiple body piercings    Tremor    RT HAND    Family History  Problem Relation Age of Onset   Leukemia Mother    Healthy Sister    Healthy Sister    Healthy Son    Healthy Son    Healthy Daughter     Social History   Socioeconomic History   Marital status: Widowed    Spouse name: Not on file   Number of children: 3   Years of education: Not on file   Highest education level: 12th grade  Occupational History   Not on file  Tobacco Use   Smoking status: Never   Smokeless tobacco: Never  Vaping Use   Vaping status: Never Used  Substance and Sexual Activity   Alcohol use: No    Alcohol/week: 1.0 standard drink of alcohol    Types: 1 Glasses of wine per week    Comment: no alcohol in 2 years   Drug use: No   Sexual activity: Yes  Other Topics Concern   Not on file  Social History Narrative   Right handed   2 Story home    Lives with spouse and son   Social Drivers of Corporate Investment Banker Strain: Low Risk  (05/28/2023)   Received from [facility]   Overall Financial Resource Strain (CARDIA)    Difficulty of Paying Living Expenses: Not hard at all  Food Insecurity: No Food Insecurity (08/24/2023)   Received from [facility]   Hunger Vital Sign    Within the past 12 months, you worried that your food would run out before you got the money to  buy more.: Never true    Within the past 12 months, the food you bought just didn't last and you didn't have money to get  more.: Never true  Transportation Needs: No Transportation Needs (08/24/2023)   Received from [facility] - Transportation    Lack of Transportation (Medical): No    Lack of Transportation (Non-Medical): No  Physical Activity: Unknown (12/22/2022)   Received from [facility]   Exercise Vital Sign    On average, how many days per week do you engage in moderate to strenuous exercise (like a brisk walk)?: 0 days    Minutes of Exercise per Session: Not on file  Stress: Stress Concern Present (12/22/2022)   Received from [facility] - Occupational Stress Questionnaire    Feeling of Stress : Very much  Social Connections: Moderately Integrated (12/22/2022)   Received from [facility]   Social Network    How would you rate your social network (family, work, friends)?: Adequate participation with social networks  Intimate Partner Violence: Not At Risk (12/22/2022)   Received from [facility]   HITS    Over the last 12 months how often did your partner physically hurt you?: Never    Over the last 12 months how often did your partner insult you or talk down to you?: Never    Over the last 12 months how often did your partner threaten you with physical harm?: Never    Over the last 12 months how often did your partner scream or curse at you?: Never    Past Medical History, Surgical history, Social history, and Family history were reviewed and updated as appropriate.   Please see review of systems for further details on the patient's review from today.   Objective:   Physical Exam:  There were no vitals taken for this visit.  Physical Exam Neurological:     Mental Status: He is alert and oriented to person, place, and time.     Cranial Nerves: No dysarthria.     Motor: Weakness and tremor present.      Coordination: Coordination abnormal.     Gait: Gait abnormal.     Comments: Fidgety legs and moving around in chair.  Psychiatric:        Attention and Perception: Attention and perception normal.        Mood and Affect: Mood is anxious. Mood is not depressed.        Speech: Speech normal. Speech is not rapid and pressured or slurred.        Behavior: Behavior is not agitated or slowed. Behavior is cooperative.        Thought Content: Thought content normal. Thought content is not paranoid or delusional. Thought content does not include homicidal or suicidal ideation. Thought content does not include suicidal plan.        Cognition and Memory: Memory normal. Cognition is impaired.     Comments: Insight fair and judgment fair to poor Chronic PM anxiety Much more animated Some pressure of speech but directable.  Overinclusive. Appropriate humor.   Less depressed.      Lab Review:     Component Value Date/Time   NA 142 08/23/2023 1700   K 4.5 08/23/2023 1700   CL 106 08/23/2023 1700   CO2 22 08/23/2023 1700   GLUCOSE 102 (H) 08/23/2023 1700   BUN 20 08/23/2023 1700   CREATININE 1.08 08/23/2023 1700   CALCIUM  9.6 08/23/2023 1700   PROT 6.9 08/23/2023 1700   ALBUMIN 4.5 08/23/2023 1700   AST 36 08/23/2023 1700   ALT 13 08/23/2023 1700  ALKPHOS 109 08/23/2023 1700   BILITOT 0.5 08/23/2023 1700   GFRNONAA >60 08/23/2023 1700   GFRAA >90 10/06/2012 1130       Component Value Date/Time   WBC 6.8 08/23/2023 1700   RBC 3.66 (L) 08/23/2023 1700   HGB 11.8 (L) 08/23/2023 1700   HCT 35.8 (L) 08/23/2023 1700   PLT 187 08/23/2023 1700   MCV 97.8 08/23/2023 1700   MCH 32.2 08/23/2023 1700   MCHC 33.0 08/23/2023 1700   RDW 12.8 08/23/2023 1700   LYMPHSABS 0.9 08/23/2023 1700   MONOABS 0.8 08/23/2023 1700   EOSABS 0.2 08/23/2023 1700   BASOSABS 0.1 08/23/2023 1700    Lithium  Lvl  Date Value Ref Range Status  09/30/2021 0.22 (L) 0.60 - 1.20 mmol/L Final    Comment:     Performed at [facility], [address] [place], [place] [address]     No results found for: PHENYTOIN, PHENOBARB, VALPROATE, CBMZ   .res Assessment: Plan:    Bipolar I disorder, most recent episode depressed (HCC) - Plan: OXcarbazepine  (TRILEPTAL ) 150 MG tablet  Generalized anxiety disorder  Panic disorder with agoraphobia  Claustrophobia  Mild cognitive impairment  Insomnia due to mental condition  Parkinson's disease with dyskinesia and fluctuating manifestations (HCC)   Dependent and help seeking behaviors.  50 min face to face time with patient was spent on counseling and coordination of care. We discussed Anxiety and insomnia have improved some.  Situation is better now that he is at [facility] but they don't provide medical assistance but he is not alone all the time now.   Excessive sleepiness most likely related to Sinemet .     Chronic insomnia is better but erratic.   More panic to unmanageable degree.  More dependence behavior bc of anxiety  Call if sleep worsens or mania.  Needs asst living and is in [place] now but it is not asst living.   Sister Andree is helping now.  Disc whith her his care.    Disc risk of PD meds affecting psych status including  Risk of PD.  We discussed the short-term risks associated with benzodiazepines including sedation and increased fall risk among others.  Discussed long-term side effect risk including dependence, potential withdrawal symptoms, and the potential eventual dose-related risk of dementia.  But recent studies from 2020 dispute this association between benzodiazepines and dementia risk. Newer studies in 2020 do not support an association with dementia.  He is taking more of this.  Counseling 20 min: on self care and getting assistance and appropriate level of assistance.  Disc his fear.  Disc case with sister andree also to help him.    Reduce oxcarbazepine  to 1 in evening for 2  weeks and stop bc polypharmacy and BC DDI with other meds.    lorazepam  2 mg  LED prn 1/2 of 2 mg tablet TID prn  Continue mirtazapine 15 mg hs Continue duloxetine 60 mg daily.  Disc risk duloxetine can trigger mania.  Appears a little hypomanic but not disruptive.   Patient agrees with this plan.  FU 3-4 mos  Lorene Macintosh, MD, DFAPA     Please see After Visit Summary for patient specific instructions.  No future appointments.    No orders of the defined types were placed in this encounter.      -------------------------------

## 2023-12-11 ENCOUNTER — Other Ambulatory Visit: Payer: Self-pay | Admitting: Psychiatry

## 2023-12-11 DIAGNOSIS — F4001 Agoraphobia with panic disorder: Secondary | ICD-10-CM

## 2023-12-23 ENCOUNTER — Other Ambulatory Visit: Payer: Self-pay

## 2023-12-23 ENCOUNTER — Emergency Department (HOSPITAL_BASED_OUTPATIENT_CLINIC_OR_DEPARTMENT_OTHER)

## 2023-12-23 ENCOUNTER — Encounter (HOSPITAL_BASED_OUTPATIENT_CLINIC_OR_DEPARTMENT_OTHER): Payer: Self-pay

## 2023-12-23 ENCOUNTER — Emergency Department (HOSPITAL_BASED_OUTPATIENT_CLINIC_OR_DEPARTMENT_OTHER)
Admission: EM | Admit: 2023-12-23 | Discharge: 2023-12-24 | Disposition: A | Discharge status: Home / Self Care | Attending: Emergency Medicine | Admitting: Emergency Medicine

## 2023-12-23 DIAGNOSIS — Y92009 Unspecified place in unspecified non-institutional (private) residence as the place of occurrence of the external cause: Secondary | ICD-10-CM | POA: Insufficient documentation

## 2023-12-23 DIAGNOSIS — S0993XA Unspecified injury of face, initial encounter: Secondary | ICD-10-CM | POA: Diagnosis present

## 2023-12-23 DIAGNOSIS — S63284D Dislocation of proximal interphalangeal joint of right ring finger, subsequent encounter: Secondary | ICD-10-CM | POA: Diagnosis not present

## 2023-12-23 DIAGNOSIS — Y9301 Activity, walking, marching and hiking: Secondary | ICD-10-CM | POA: Diagnosis not present

## 2023-12-23 DIAGNOSIS — W19XXXA Unspecified fall, initial encounter: Secondary | ICD-10-CM

## 2023-12-23 DIAGNOSIS — S0181XA Laceration without foreign body of other part of head, initial encounter: Secondary | ICD-10-CM | POA: Insufficient documentation

## 2023-12-23 DIAGNOSIS — G20C Parkinsonism, unspecified: Secondary | ICD-10-CM | POA: Diagnosis not present

## 2023-12-23 DIAGNOSIS — S63259D Unspecified dislocation of unspecified finger, subsequent encounter: Secondary | ICD-10-CM

## 2023-12-23 DIAGNOSIS — W01198A Fall on same level from slipping, tripping and stumbling with subsequent striking against other object, initial encounter: Secondary | ICD-10-CM | POA: Insufficient documentation

## 2023-12-23 DIAGNOSIS — Z23 Encounter for immunization: Secondary | ICD-10-CM | POA: Diagnosis not present

## 2023-12-23 MED ORDER — TETANUS-DIPHTH-ACELL PERTUSSIS 5-2-15.5 LF-MCG/0.5 IM SUSP
0.5000 mL | Freq: Once | INTRAMUSCULAR | Status: AC
  Administered 2023-12-23: 0.5 mL via INTRAMUSCULAR
  Filled 2023-12-23: qty 0.5

## 2023-12-23 MED ORDER — LIDOCAINE HCL (PF) 1 % IJ SOLN
5.0000 mL | Freq: Once | INTRAMUSCULAR | Status: AC
  Administered 2023-12-23: 5 mL
  Filled 2023-12-23: qty 5

## 2023-12-23 NOTE — ED Provider Notes (Signed)
 [place] EMERGENCY DEPARTMENT AT [facility] Provider Note   CSN: [id number] Arrival date & time: 12/23/23  2021     Patient presents with: Bill Guerrero is a 73 y.o. male.  With a history of Parkinson's disease who presents to the ED after a fall.  Patient suffers from frequent falls at home.  He tripped over something in his living room and struck his head on the TV console.  Now with laceration over right brow.  No LOC.  No other injuries were sustained tonight.  Did have a fall back in July as well and has persistent right ring finger fracture dislocation that he has not followed up with a hand office for yet    Fall       Prior to Admission medications   Medication Sig Start Date End Date Taking? Authorizing Provider  carbidopa -levodopa  (SINEMET  CR) 50-200 MG tablet TAKE ONE TABLET BY MOUTH EVERY NIGHT AT BEDTIME 12/12/20   [name], Rebecca S, DO  carbidopa -levodopa  (SINEMET  IR) 25-100 MG tablet TAKE 2 TABLETS BY MOUTH AT 8AM, 2 TABLETS AT NOON, 2 TABLETS AT 4PM, AND 2 TABLETS AT 8PM 04/28/21   [name], Rebecca S, DO  DULoxetine (CYMBALTA) 30 MG capsule Take 30 mg by mouth 2 (two) times daily. 06/21/23   [provider]  gabapentin (NEURONTIN) 300 MG capsule Take 1 capsule by mouth at bedtime. 04/02/23   [provider]  lamoTRIgine  (LAMICTAL ) 100 MG tablet Take 1 tablet (100 mg total) by mouth 2 (two) times daily. 08/04/23   [name], Lorene KANDICE Raddle., MD  lithium  carbonate (LITHOBID ) 300 MG ER tablet TAKE 1 TABLET BY MOUTH AT BEDTIME 09/10/23   [name], Lorene KANDICE Raddle., MD  LORazepam  (ATIVAN ) 2 MG tablet TAKE A HALF TABLET BY MOUTH EVERY 8 HOURS AS NEEDED FOR ANXIETY 12/13/23   [name], Lorene KANDICE Raddle., MD  losartan  (COZAAR ) 50 MG tablet Take 50 mg by mouth daily. Pt. Is unsure of Mg.    [provider]  magnesium  oxide (MAG-OX) 400 MG tablet Take 400 mg by mouth daily.    [provider]  mirtazapine (REMERON) 15 MG tablet Take 15 mg by mouth at  bedtime. 06/29/23 06/23/24  [provider]  mupirocin  cream (BACTROBAN ) 2 % Apply 1 Application topically 2 (two) times daily. 09/22/22   Kingsley, Victoria K, DO  niacin  500 MG tablet Take 500 mg by mouth daily with breakfast.    [provider]  Omega-3 Fatty Acids (FISH OIL BURP-LESS PO) Take 1 capsule by mouth in the morning and at bedtime.    [provider]  OXcarbazepine  (TRILEPTAL ) 150 MG tablet 1 tablet nightly for 15 days and then stop it. 12/06/23   [name], Lorene KANDICE Raddle., MD  Potassium 99 MG TABS Take 1 tablet by mouth daily.    [provider]  pravastatin  (PRAVACHOL ) 80 MG tablet Take 80 mg by mouth daily before breakfast.    [provider]  valACYclovir  (VALTREX ) 1000 MG tablet Take 1 tablet (1,000 mg total) by mouth 3 (three) times daily. 09/22/22   Kingsley, Victoria K, DO    Allergies: Patient has no known allergies.    Review of Systems  Updated Vital Signs BP (!) 158/86   Pulse 87   Temp 98.1 F (36.7 C) (Oral)   Resp 19   Ht 6' 1 (1.854 m)   Wt 108.9 kg   SpO2 100%   BMI 31.66 kg/m   Physical Exam Vitals  and nursing note reviewed.  HENT:     Head:     Comments: 4 cm laceration just superior to right brow.  No active bleeding linear Eyes:     Pupils: Pupils are equal, round, and reactive to light.  Cardiovascular:     Rate and Rhythm: Normal rate and regular rhythm.  Pulmonary:     Effort: Pulmonary effort is normal.     Breath sounds: Normal breath sounds.  Abdominal:     Palpations: Abdomen is soft.     Tenderness: There is no abdominal tenderness.  Musculoskeletal:     Cervical back: Neck supple. No tenderness.     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  Neurological:     Mental Status: He is alert.  Psychiatric:        Mood and Affect: Mood normal.     (all labs ordered are listed, but only  abnormal results are displayed) Labs Reviewed - No data to display  EKG: None  Radiology: CT Cervical Spine Wo Contrast Result Date: 12/23/2023 CLINICAL DATA:  Status post fall. EXAM: CT CERVICAL SPINE WITHOUT CONTRAST TECHNIQUE: Multidetector CT imaging of the cervical spine was performed without intravenous contrast. Multiplanar CT image reconstructions were also generated. RADIATION DOSE REDUCTION: This exam was performed according to the departmental dose-optimization program which includes automated exposure control, adjustment of the mA and/or kV according to patient size and/or use of iterative reconstruction technique. COMPARISON:  May 20, 2023 FINDINGS: Alignment: There is straightening of the normal cervical spine lordosis. Skull base and vertebrae: No acute fracture. Chronic and degenerative changes are seen involving the body and tip of the dens, as well as the adjacent portion of the anterior arch of C1. Soft tissues and spinal canal: No prevertebral fluid or swelling. No visible canal hematoma. Disc levels: Marked severity endplate sclerosis, anterior osteophyte formation and posterior bony spurring are seen throughout all levels of the cervical spine. There is marked severity narrowing of the anterior atlantoaxial articulation. A metallic density fusion plate and screws are seen along the anterior aspect of the C5 and C6 vertebral bodies with subsequent fusion of the intervertebral disc space. Marked severity intervertebral disc space narrowing is present at the level of C6-C7. Bilateral marked severity multilevel facet joint hypertrophy is noted. Upper chest: Negative. Other: None. IMPRESSION: 1. No acute fracture or subluxation of the cervical spine. 2. Marked severity multilevel degenerative changes, as described above. 3. Prior anterior fusion of the C5 and C6 vertebral bodies. Electronically Signed   By: Suzen Dials M.D.   On: 12/23/2023 22:00   CT Head Wo Contrast Result  Date: 12/23/2023 CLINICAL DATA:  Status post fall. EXAM: CT HEAD WITHOUT CONTRAST TECHNIQUE: Contiguous axial images were obtained from the base of the skull through the vertex without intravenous contrast. RADIATION DOSE REDUCTION: This exam was performed according to the departmental dose-optimization program which includes automated exposure control, adjustment of the mA and/or kV according to patient size and/or use of iterative reconstruction technique. COMPARISON:  Jun 13, 2023 FINDINGS: Brain: There is generalized cerebral atrophy with widening of the extra-axial spaces and ventricular dilatation. There are areas of decreased attenuation within the white matter tracts of the supratentorial brain, consistent with microvascular disease changes. Vascular: Moderate to marked severity bilateral cavernous carotid artery calcification is noted. Skull: Normal. Negative for fracture or focal lesion. Sinuses/Orbits: No acute finding. Other:  There is mild right frontal scalp soft tissue swelling with an associated right frontal scalp laceration. IMPRESSION: 1. Generalized cerebral atrophy and microvascular disease changes of the supratentorial brain. 2. Mild right frontal scalp soft tissue swelling with an associated right frontal scalp laceration. 3. No acute intracranial abnormality. Electronically Signed   By: Suzen Dials M.D.   On: 12/23/2023 21:37   DG Hand Complete Right Result Date: 12/23/2023 CLINICAL DATA:  Status post trauma. EXAM: RIGHT HAND - COMPLETE 3+ VIEW COMPARISON:  August 23, 2023 FINDINGS: There is dorsal dislocation of the PIP joint of the fourth right finger. This is seen on the prior study. A chronic avulsion fracture deformity is also noted within this region. Diffuse soft tissue swelling of the fourth right finger is seen. IMPRESSION: Dorsal dislocation of the PIP joint of the fourth right finger with associated chronic avulsion fracture deformity. Electronically Signed   By: Suzen Dials M.D.   On: 12/23/2023 21:15     Procedures   Medications Ordered in the ED  lidocaine  (PF) (XYLOCAINE ) 1 % injection 5 mL (5 mLs Infiltration Given by Other 12/23/23 2214)  Tdap (ADACEL) injection 0.5 mL (0.5 mLs Intramuscular Given 12/23/23 2212)                                    Medical Decision Making 73 year old male with history as above presenting for another mechanical fall at home.  Parkinson's disease and has frequent falls.  Right brow laceration.  No other injuries.  CT head C-spine showed no acute traumatic injuries.  Laceration repaired by PA Young as documented in separate note.  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  Counseled patient on sutured wound care  Amount and/or Complexity of Data Reviewed Radiology: ordered.  Risk Prescription drug management.        Final diagnoses:  Facial laceration, initial encounter  Dislocation of finger, subsequent encounter  Fall in home, initial encounter    ED Discharge Orders     None          Pamella Ozell LABOR, DO 12/23/23 2359

## 2023-12-23 NOTE — ED Triage Notes (Addendum)
 Hx of Parkinsons, fell when walking across the living room. Laceration to R forehead. Denies LOC, blurred vision, or dizziness. Reports 5/10 headache. Denies any other injury. Denies thinners

## 2023-12-23 NOTE — Discharge Instructions (Addendum)
 You were seen in the emergency room for injuries after fall Your CAT scan of your head and neck looked okay Your laceration was repaired with suturing The stitches will need to come out in 8 to 10 days either here in the emergency department, at an urgent care or at your primary doctor's office Keep the wound clean and dry We updated your tetanus here The x-ray of your hand does show dislocated finger again that you have had since July Need to follow-up with Dr. Murrell who is a hand specialist to discuss how to fix this Return to the Emergency Department for severe pain or any concerns

## 2023-12-23 NOTE — ED Provider Notes (Signed)
.  Laceration Repair  Date/Time: 12/23/2023 10:52 PM  Performed by: Neysa Thersia RAMAN, PA-C Authorized by: Neysa Thersia RAMAN, PA-C   Consent:    Consent obtained:  Verbal   Consent given by:  Patient   Risks discussed:  Infection Universal protocol:    Patient identity confirmed:  Verbally with patient Anesthesia:    Anesthesia method:  None Laceration details:    Location:  Face   Face location:  Forehead   Length (cm):  4   Depth (mm):  2 Pre-procedure details:    Preparation:  Patient was prepped and draped in usual sterile fashion Exploration:    Hemostasis achieved with:  Direct pressure and tied off vessels Treatment:    Area cleansed with:  Povidone-iodine    Amount of cleaning:  Standard   Irrigation solution:  Sterile saline   Irrigation volume:  20cc   Irrigation method:  Syringe   Debridement:  None   Undermining:  None Skin repair:    Repair method:  Sutures   Suture size:  4-0   Suture material:  Prolene   Suture technique:  Simple interrupted   Number of sutures:  9 Approximation:    Approximation:  Close Repair type:    Repair type:  Simple Post-procedure details:    Dressing:  Open (no dressing)   Procedure completion:  Tolerated well, no immediate complications     Neysa Thersia RAMAN, PA-C 12/23/23 2254    Pamella Ozell LABOR, DO 12/24/23 0020

## 2023-12-24 NOTE — ED Notes (Signed)
  Patient d/c back to [facility] by [place].  Patient given paperwork and assisted to stretcher.  Facility called and ready to assist patient to his independent living residence.

## 2024-01-29 ENCOUNTER — Other Ambulatory Visit: Payer: Self-pay | Admitting: Psychiatry

## 2024-01-29 DIAGNOSIS — F3130 Bipolar disorder, current episode depressed, mild or moderate severity, unspecified: Secondary | ICD-10-CM

## 2024-02-01 ENCOUNTER — Other Ambulatory Visit: Payer: Self-pay | Admitting: Psychiatry

## 2024-02-01 DIAGNOSIS — F3130 Bipolar disorder, current episode depressed, mild or moderate severity, unspecified: Secondary | ICD-10-CM

## 2024-02-09 ENCOUNTER — Other Ambulatory Visit: Payer: Self-pay | Admitting: Psychiatry

## 2024-02-09 DIAGNOSIS — F3130 Bipolar disorder, current episode depressed, mild or moderate severity, unspecified: Secondary | ICD-10-CM

## 2024-02-09 NOTE — Telephone Encounter (Signed)
 Nov 2025 visit: Reduce oxcarbazepine  to 1 in evening for 2 weeks and stop bc polypharmacy and BC DDI with other meds.
